# Patient Record
Sex: Female | Born: 1955 | Race: White | Hispanic: No | Marital: Married | Smoking: Never smoker
Health system: Southern US, Community
[De-identification: ages and names within clinical notes are randomized; demographics above are authoritative.]

## PROBLEM LIST (undated history)

## (undated) DIAGNOSIS — C541 Malignant neoplasm of endometrium: Secondary | ICD-10-CM

## (undated) DIAGNOSIS — I509 Heart failure, unspecified: Secondary | ICD-10-CM

## (undated) DIAGNOSIS — I10 Essential (primary) hypertension: Secondary | ICD-10-CM

## (undated) DIAGNOSIS — I890 Lymphedema, not elsewhere classified: Secondary | ICD-10-CM

## (undated) DIAGNOSIS — M170 Bilateral primary osteoarthritis of knee: Secondary | ICD-10-CM

## (undated) HISTORY — DX: Morbid (severe) obesity due to excess calories: E66.01

## (undated) HISTORY — DX: Essential (primary) hypertension: I10

## (undated) HISTORY — DX: Lymphedema, not elsewhere classified: I89.0

## (undated) HISTORY — DX: Malignant neoplasm of endometrium: C54.1

## (undated) HISTORY — DX: Bilateral primary osteoarthritis of knee: M17.0

## (undated) NOTE — *Deleted (*Deleted)
Physician Discharge Summary  Carmen [name] [id number] DOB: 1956/09/06 DOA: 09/26/2020  PCP: Cletis Athens, MD  Admit date: 09/26/2020 Discharge date: 10/07/2020  Admitted From: home Disposition:  SNF  Recommendations for Outpatient Follow-up:  1. Follow-up with PCP in 1 week 2. Repeat BMP and magnesium in 1 week 3. Follow-up with cardiology in 1 week  4. Follow-up with lymphedema clinic 5. Low-sodium diet   Home Health:none  Equipment/Devices:none Discharge Condition:stable CODE STATUS: full code  Diet recommendation: low sodium diet  Brief/Interim Summary: 85 year old female with history of chronic diastolic heart failure, morbid obesity, chronic respiratory failure on home O2 2 L history of endometrial cancer presenting with several day history of dyspnea on exertion. She's currently requiring 4 L by [place] and is being treated for a heart failure exacerbation.   Acute on chronic respiratory failure with hypoxia: -Secondary to underlying acute on chronic diastolic CHF. -Continued to require more than her baseline, needing 2-4 L (on 2 L at baseline) which is likely her new baseline. -CTA chest negative for acute PE.   -No evidence of infiltrate on chest x-ray or CTA  Acute on chronic diastolic CHF (congestive heart failure) ([place]) -On admission chest x-ray:cardiomegaly and pulmonary vascular congestion -Initially was started on Lasix 40 IV twice daily then was switched to Lasix 60 twice daily and then switched 40 p.o. twice daily  on 10/06/2020. She is net negative 19L  -continuedlisinopril, Coreg - CXR 11/6 with mild pulm edema, will continue diuresis - repeat CXR 11/7 - mild CHF, improved - repeat CXR 11/9 - mild vascular congestion without overt pulm edema -Echocardiogramwas overall a difficult study due to body habitus but shows normal LV systolic function. -Cardiologyinput appreciated -> recommended 40 mg PO lasix, increased lisinopril to 20 mg daily in addition  to coreg Patient will be discharged on Lasix 40 p.o. twice daily and lisinopril 20 mg daily as per cardiology recommendations.  Elevated troponin -Due to demand ischemia. No chest pain/MI - cardiology signed off.  Chronic lymphedema - wound care consulted-> appreciated woudn care recs -She may benefit fromlymphedema clinic-discussed about to follow-up with lymphedema clinic outpatient.  Morbid obesity with BMI of 60.0-69.9, adult ([place]) -Complicating factor to overall prognosis and care -Encourage diet modification/walking and weight loss.  Discharge Diagnoses:  Acute on chronic respiratory failure with hypoxia Acute on chronic diastolic CHF Elevated troponin Chronic lymphedema Morbid obesity with BMI of 60   Discharge Instructions  Discharge Instructions    Diet - low sodium heart healthy   Complete by: As directed    Discharge instructions   Complete by: As directed    Follow-up with PCP in 1 week Repeat BMP and magnesium in 1 week Follow-up with cardiology in 1 week  Follow-up with lymphedema clinic   Discharge wound care:   Complete by: As directed    As per wound care recommendations   Increase activity slowly   Complete by: As directed      Allergies as of 10/07/2020      Reactions   Clarithromycin    REACTION: respiratory issues, can't breathe   Penicillins    REACTION: can't breath      Medication List    STOP taking these medications   amLODipine 10 MG tablet Commonly known as: NORVASC   cloNIDine 0.1 MG tablet Commonly known as: CATAPRES     TAKE these medications   carvedilol 6.25 MG tablet Commonly known as: COREG Take 1 tablet (6.25 mg total) by mouth 2 (two) times  daily with a meal. What changed:   medication strength  how much to take  when to take this   enalapril 20 MG tablet Commonly known as: VASOTEC Take 20 mg by mouth daily.   furosemide 40 MG tablet Commonly known as: LASIX Take 1 tablet (40 mg total) by mouth  2 (two) times daily. What changed:   how much to take  when to take this   MEGA MULTI WOMEN PO Take 1,200 mg by mouth daily.   potassium chloride 10 MEQ tablet Commonly known as: KLOR-CON TAKE 1 TABLET AT BEDTIME   vitamin E 180 MG (400 UNITS) capsule Take 400 Units by mouth 2 (two) times daily.            Durable Medical Equipment  (From admission, onward)         Start     Ordered   10/01/20 1048  For home use only DME standard manual wheelchair with seat cushion  Once       Comments: Patient suffers from heart failure which impairs their ability to perform daily activities like bathing, dressing, and toileting in the home.  A walker will not resolve issue with performing activities of daily living. A wheelchair will allow patient to safely perform daily activities. Patient can safely propel the wheelchair in the home or has a caregiver who can provide assistance. Length of need 12 months . Accessories: elevating leg rests (ELRs), wheel locks, extensions and anti-tippers.   10/01/20 1048   09/30/20 1234  For home use only DME Bedside commode  Once       Comments: 3 in 1  Question:  Patient needs a bedside commode to treat with the following condition  Answer:  Weakness   09/30/20 1233   09/30/20 1233  For home use only DME Walker rolling  Once       Question Answer Comment  Walker: With [place]   Patient needs a walker to treat with the following condition Weakness      09/30/20 1232           Discharge Care Instructions  (From admission, onward)         Start     Ordered   10/07/20 0000  Discharge wound care:       Comments: As per wound care recommendations   10/07/20 [place] Follow up on 10/13/2020.   Specialty: Cardiology Why: at 2:30pm. Enter through the [place] entrance Contact information: [address]  [place]             Allergies  Allergen Reactions  . Clarithromycin     REACTION: respiratory issues, can't breathe  . Penicillins     REACTION: can't breath    Consultations:     Procedures/Studies: CT Angio Chest PE W and/or Wo Contrast  Result Date: 09/27/2020 CLINICAL DATA:  Elevated D-dimer. EXAM: CT ANGIOGRAPHY CHEST WITH CONTRAST TECHNIQUE: Multidetector CT imaging of the chest was performed using the standard protocol during bolus administration of intravenous contrast. Multiplanar CT image reconstructions and MIPs were obtained to evaluate the vascular anatomy. CONTRAST:  128mL OMNIPAQUE IOHEXOL 350 MG/ML SOLN COMPARISON:  None. FINDINGS: Cardiovascular: There is moderate severity calcification of the aortic arch. Satisfactory opacification of the pulmonary arteries to the segmental level. No evidence of pulmonary embolism. Normal heart size.  No pericardial effusion. Mediastinum/Nodes: There is mild right hilar and AP window lymphadenopathy. Thyroid gland, trachea, and esophagus demonstrate no significant findings. Lungs/Pleura: A 6 mm noncalcified lung nodule versus focal scar seen within the posterior aspect of the left apex. An 8 mm noncalcified lung nodule is seen within the anterolateral aspect of the right upper lobe. A 6 mm posterior right upper lobe noncalcified lung nodule is seen. Adjacent 6 mm noncalcified lung nodules are seen along the posterolateral aspect of the right lower lobe. There is no evidence of acute infiltrate, pleural effusion or pneumothorax. Upper Abdomen: No acute abnormality. Musculoskeletal: No chest wall abnormality. No acute or significant osseous findings. Review of the MIP images confirms the above findings. IMPRESSION: 1. No evidence of pulmonary embolism. 2. Multiple bilateral noncalcified lung nodules. Correlation with follow-up chest CT is recommended in 3-6 months. 3. Aortic atherosclerosis. Aortic Atherosclerosis (ICD10-I70.0).  Electronically Signed   By: Virgina Norfolk M.D.   On: 09/27/2020 01:28   DG Chest Port 1 View  Result Date: 10/05/2020 CLINICAL DATA:  Shortness of breath. EXAM: PORTABLE CHEST 1 VIEW COMPARISON:  10/03/2020 FINDINGS: The heart is mildly enlarged but stable. Mild vascular congestion without overt pulmonary edema. No definite infiltrates or effusions. IMPRESSION: Cardiac enlargement and mild vascular congestion without overt pulmonary edema. Electronically Signed   By: Marijo Sanes M.D.   On: 10/05/2020 07:53   DG Chest Port 1 View  Result Date: 10/03/2020 CLINICAL DATA:  Dyspnea, CHF EXAM: PORTABLE CHEST 1 VIEW COMPARISON:  Chest radiograph from one day prior. FINDINGS: Stable cardiomediastinal silhouette with mild cardiomegaly. No pneumothorax. No pleural effusion. Borderline mild pulmonary edema, improved. No consolidative airspace disease. IMPRESSION: Borderline mild congestive heart failure, improved. Electronically Signed   By: Ilona Sorrel M.D.   On: 10/03/2020 15:02   DG Chest Port 1 View  Result Date: 10/02/2020 CLINICAL DATA:  Shortness of breath, acute on chronic diastolic heart failure, morbid obesity, chronic respiratory failure, history of endometrial cancer EXAM: PORTABLE CHEST 1 VIEW COMPARISON:  Portable exam 0651 hours compared to 09/26/2020 FINDINGS: Enlargement of cardiac silhouette with pulmonary vascular congestion. Scattered interstitial infiltrate consistent with mild pulmonary edema and CHF. Atherosclerotic calcification and tortuosity of thoracic aorta. No pleural effusion or pneumothorax. Osseous structures unremarkable. IMPRESSION: Enlargement of cardiac silhouette with pulmonary vascular congestion and mild pulmonary edema. Electronically Signed   By: Lavonia Dana M.D.   On: 10/02/2020 12:32   DG Chest Portable 1 View  Result Date: 09/26/2020 CLINICAL DATA:  Shortness of breath. EXAM: PORTABLE CHEST 1 VIEW COMPARISON:  March 12, 2014 FINDINGS: There is no evidence of  acute infiltrate, pleural effusion or pneumothorax. Mild to moderate severity prominence of the perihilar pulmonary vasculature is seen. The cardiac silhouette is mildly enlarged. The visualized skeletal structures are unremarkable. IMPRESSION: Cardiomegaly with mild to moderate severity pulmonary vascular congestion. Electronically Signed   By: Virgina Norfolk M.D.   On: 09/26/2020 22:10   ECHOCARDIOGRAM COMPLETE  Result Date: 09/27/2020    ECHOCARDIOGRAM REPORT   Patient Name:   CHASELYNN DRAFT Date of Exam: 09/27/2020 Medical Rec #:  [id number]       Height:       62.0 in Accession #:    [id number]      Weight:       345.5 lb Date of Birth:  09-03-1956       BSA:          2.412 m Patient Age:    40 years  BP:           118/60 mmHg Patient Gender: F               HR:           64 bpm. Exam Location:  [facility] Procedure: 2D Echo, Cardiac Doppler and Color Doppler Indications:     CHF- acute diastolic [id number]  History:         Patient has no prior history of Echocardiogram examinations.                  CHF.  Sonographer:     Sherrie Sport RDCS (AE) Referring Phys:  [id number] Athena Masse Diagnosing Phys: Kathlyn Sacramento MD  Sonographer Comments: No apical window, no subcostal window and Technically challenging study due to limited acoustic windows. IMPRESSIONS  1. Left ventricular ejection fraction, by estimation, is 55 to 60%. The left ventricle has normal function. Left ventricular endocardial border not optimally defined to evaluate regional wall motion. Left ventricular diastolic function could not be evaluated.  2. Right ventricular systolic function is normal. The right ventricular size is normal.  3. Left atrial size was moderately dilated.  4. The mitral valve is normal in structure. No evidence of mitral valve regurgitation. No evidence of mitral stenosis.  5. The aortic valve is normal in structure. Aortic valve regurgitation is not visualized. Mild to moderate aortic valve sclerosis/calcification is  present, without any evidence of aortic stenosis.  6. Very challenging image quality with limited views. FINDINGS  Left Ventricle: Left ventricular ejection fraction, by estimation, is 55 to 60%. The left ventricle has normal function. Left ventricular endocardial border not optimally defined to evaluate regional wall motion. The left ventricular internal cavity size was normal in size. There is no left ventricular hypertrophy. Left ventricular diastolic function could not be evaluated. Right Ventricle: The right ventricular size is normal. No increase in right ventricular wall thickness. Right ventricular systolic function is normal. Left Atrium: Left atrial size was moderately dilated. Right Atrium: Right atrial size was normal in size. Pericardium: There is no evidence of pericardial effusion. Mitral Valve: The mitral valve is normal in structure. No evidence of mitral valve regurgitation. No evidence of mitral valve stenosis. Tricuspid Valve: The tricuspid valve is normal in structure. Tricuspid valve regurgitation is trivial. No evidence of tricuspid stenosis. Aortic Valve: The aortic valve is normal in structure. Aortic valve regurgitation is not visualized. Mild to moderate aortic valve sclerosis/calcification is present, without any evidence of aortic stenosis. Pulmonic Valve: The pulmonic valve was normal in structure. Pulmonic valve regurgitation is not visualized. No evidence of pulmonic stenosis. Aorta: The aortic root is normal in size and structure. Venous: The inferior vena cava was not well visualized. IAS/Shunts: No atrial level shunt detected by color flow Doppler.  LEFT VENTRICLE PLAX 2D LVIDd:         4.22 cm LVIDs:         2.75 cm LV PW:         1.14 cm LV IVS:        0.99 cm LVOT diam:     2.00 cm LVOT Area:     3.14 cm  LEFT ATRIUM         Index LA diam:    4.50 cm 1.87 cm/m                        PULMONIC VALVE AORTA  PV Vmax:        0.92 m/s Ao Root diam: 2.40 cm PV Peak  grad:   3.4 mmHg                       RVOT Peak grad: 16 mmHg   SHUNTS Systemic Diam: 2.00 cm Kathlyn Sacramento MD Electronically signed by Kathlyn Sacramento MD Signature Date/Time: 09/27/2020/12:38:09 PM    Final       Subjective: Patient seen and examined.  Sitting comfortably on the bed.  Tells me that she feels better, denies shortness of breath, chest pain, orthopnea, PND.  Discussed about outpatient lymphedema clinic follow-up and she verbalized understanding.  Discharge Exam: Vitals:   10/07/20 0458 10/07/20 0743  BP: (!) 124/56 114/72  Pulse: (!) 55 (!) 57  Resp: 20 16  Temp: 97.9 F (36.6 C) 97.9 F (36.6 C)  SpO2: 94% 99%   Vitals:   10/06/20 1945 10/07/20 0017 10/07/20 0458 10/07/20 0743  BP: 110/70 (!) 114/52 (!) 124/56 114/72  Pulse: (!) 57 82 (!) 55 (!) 57  Resp: 20 20 20 16   Temp: 97.9 F (36.6 C) 98.2 F (36.8 C) 97.9 F (36.6 C) 97.9 F (36.6 C)  TempSrc: Oral Oral    SpO2: 97% 92% 94% 99%  Weight:      Height:        General: Pt is alert, awake, not in acute distress, on 4 L of oxygen via nasal cannula.  Morbidly obese. Cardiovascular: RRR, S1/S2 +, no rubs, no gallops Respiratory: CTA bilaterally, no wheezing, no rhonchi Abdominal: Soft, NT, ND, bowel sounds + Extremities: Bilateral chronic lymphedema noted.   The results of significant diagnostics from this hospitalization (including imaging, microbiology, ancillary and laboratory) are listed below for reference.     Microbiology: No results found for this or any previous visit (from the past 240 hour(s)).   Labs: BNP (last 3 results) Recent Labs    09/26/20 2213 10/03/20 0444  BNP 123.2* [id number]   Basic Metabolic Panel: Recent Labs  Lab 10/02/20 0520 10/03/20 0444 10/04/20 0447 10/05/20 0534 10/06/20 0523  NA 142 143 141 142 142  K 3.7 3.6 3.6 3.6 3.6  CL 100 99 98 97* 99  CO2 34* 36* 34* 33* 33*  GLUCOSE 115* 128* 124* 119* 121*  BUN 20 23 25* 34* 36*  CREATININE 0.62 0.64 0.66 0.91  0.68  CALCIUM 8.9 8.7* 8.7* 8.6* 8.9  MG 2.3 2.3 2.2 2.3 2.2  PHOS 4.6 4.6 5.0* 5.4* 4.3   Liver Function Tests: Recent Labs  Lab 10/02/20 0520 10/03/20 0444 10/04/20 0447 10/05/20 0534 10/06/20 0523  AST 20 23 23 24 21   ALT 19 22 25 24 23   ALKPHOS 70 74 68 69 66  BILITOT 0.7 0.8 0.7 0.8 0.7  PROT 7.3 7.5 7.2 7.7 7.6  ALBUMIN 3.3* 3.3* 3.1* 3.5 3.3*   No results for input(s): LIPASE, AMYLASE in the last 168 hours. No results for input(s): AMMONIA in the last 168 hours. CBC: Recent Labs  Lab 10/02/20 0520 10/03/20 0444 10/04/20 0447 10/05/20 0534 10/06/20 0523  WBC 6.7 5.8 6.1 6.9 6.2  NEUTROABS 4.8 4.0 4.2 5.1 4.6  HGB 12.6 12.7 13.0 13.3 13.1  HCT 39.8 39.8 40.8 41.8 41.2  MCV 95.0 97.1 96.2 95.7 96.5  PLT 173 162 162 181 187   Cardiac Enzymes: No results for input(s): CKTOTAL, CKMB, CKMBINDEX, TROPONINI in the last 168 hours. BNP: Invalid input(s): POCBNP CBG: No results  for input(s): GLUCAP in the last 168 hours. D-Dimer No results for input(s): DDIMER in the last 72 hours. Hgb A1c No results for input(s): HGBA1C in the last 72 hours. Lipid Profile No results for input(s): CHOL, HDL, LDLCALC, TRIG, CHOLHDL, LDLDIRECT in the last 72 hours. Thyroid function studies No results for input(s): TSH, T4TOTAL, T3FREE, THYROIDAB in the last 72 hours.  Invalid input(s): FREET3 Anemia work up No results for input(s): VITAMINB12, FOLATE, FERRITIN, TIBC, IRON, RETICCTPCT in the last 72 hours. Urinalysis    Component Value Date/Time   COLORURINE YELLOW 01/19/2011 1500   APPEARANCEUR CLEAR 01/19/2011 1500   LABSPEC 1.006 01/19/2011 1500   PHURINE 7.0 01/19/2011 1500   GLUCOSEU NEGATIVE 10/15/2010 2032   HGBUR LARGE (A) 01/19/2011 1500   BILIRUBINUR NEGATIVE 01/19/2011 1500   KETONESUR NEGATIVE 01/19/2011 1500   PROTEINUR NEGATIVE 01/19/2011 1500   UROBILINOGEN 0.2 01/19/2011 1500   NITRITE NEGATIVE 01/19/2011 1500   LEUKOCYTESUR NEGATIVE 01/19/2011 1500    Sepsis Labs Invalid input(s): PROCALCITONIN,  WBC,  LACTICIDVEN Microbiology No results found for this or any previous visit (from the past 240 hour(s)).   Time coordinating discharge: Over 30 minutes  SIGNED:   Mckinley Jewel, MD  [facility] 10/07/2020, 10:04 AM Pager   If 7PM-7AM, please contact night-coverage [email or web address]

---

## 2007-06-21 ENCOUNTER — Encounter: Payer: Self-pay | Admitting: Family Medicine

## 2007-06-22 ENCOUNTER — Encounter: Payer: Self-pay | Admitting: Family Medicine

## 2007-06-22 LAB — CONVERTED CEMR LAB
ALT: 21 units/L
Cholesterol: 195 mg/dL
Hemoglobin: 13.1 g/dL
LDL Cholesterol: 127 mg/dL
Total Protein: 7.3 g/dL
Triglycerides: 134 mg/dL
WBC: 7.4 10*3/uL

## 2007-06-26 ENCOUNTER — Encounter: Payer: Self-pay | Admitting: Family Medicine

## 2007-06-26 LAB — CONVERTED CEMR LAB: Pap Smear: NORMAL

## 2010-09-02 ENCOUNTER — Encounter: Payer: Self-pay | Admitting: Family Medicine

## 2010-09-02 ENCOUNTER — Ambulatory Visit: Payer: Self-pay | Admitting: Internal Medicine

## 2010-09-02 DIAGNOSIS — M25569 Pain in unspecified knee: Secondary | ICD-10-CM

## 2010-09-02 DIAGNOSIS — E669 Obesity, unspecified: Secondary | ICD-10-CM

## 2010-09-02 LAB — CONVERTED CEMR LAB
ALT: 15 units/L (ref 0–35)
Albumin: 3.5 g/dL (ref 3.5–5.2)
Bilirubin, Direct: 0.1 mg/dL (ref 0.0–0.3)
CO2: 26 meq/L (ref 19–32)
Chloride: 107 meq/L (ref 96–112)
Creatinine, Ser: 0.7 mg/dL (ref 0.4–1.2)
Glucose, Bld: 94 mg/dL (ref 70–99)
TSH: 2.28 microintl units/mL (ref 0.35–5.50)
Total CHOL/HDL Ratio: 5
Total Protein: 7.1 g/dL (ref 6.0–8.3)
Triglycerides: 91 mg/dL (ref 0.0–149.0)

## 2010-09-15 ENCOUNTER — Ambulatory Visit: Payer: Self-pay | Admitting: Family Medicine

## 2010-09-15 ENCOUNTER — Encounter (INDEPENDENT_AMBULATORY_CARE_PROVIDER_SITE_OTHER): Payer: Self-pay | Admitting: *Deleted

## 2010-09-24 ENCOUNTER — Encounter: Payer: Self-pay | Admitting: Family Medicine

## 2010-09-30 ENCOUNTER — Ambulatory Visit: Payer: Self-pay | Admitting: Family Medicine

## 2010-09-30 ENCOUNTER — Other Ambulatory Visit: Admission: RE | Admit: 2010-09-30 | Discharge: 2010-09-30 | Payer: Self-pay | Admitting: Family Medicine

## 2010-09-30 DIAGNOSIS — R0602 Shortness of breath: Secondary | ICD-10-CM

## 2010-10-10 ENCOUNTER — Telehealth: Payer: Self-pay | Admitting: Family Medicine

## 2010-10-10 DIAGNOSIS — R87619 Unspecified abnormal cytological findings in specimens from cervix uteri: Secondary | ICD-10-CM

## 2010-10-15 ENCOUNTER — Emergency Department (HOSPITAL_COMMUNITY): Admission: EM | Admit: 2010-10-15 | Discharge: 2010-10-15 | Payer: Self-pay | Admitting: Emergency Medicine

## 2010-12-09 ENCOUNTER — Ambulatory Visit
Admission: RE | Admit: 2010-12-09 | Discharge: 2010-12-09 | Payer: Self-pay | Source: Home / Self Care | Attending: Gynecology | Admitting: Gynecology

## 2010-12-28 NOTE — Letter (Signed)
Summary: Results Follow up Letter  [place] at [facility]  [address] [place], [place] [address]   Phone: [phone]  Fax: [phone]    09/15/2010 MRN: [id number]  [facility] [address] [place], [place]  [address]  Dear Ms. Ronnald Ramp,  The following are the results of your recent test(s):  Test         Result    Pap Smear:        Normal _____  Not Normal _____ Comments: ______________________________________________________ Cholesterol: LDL(Bad cholesterol):         Your goal is less than:         HDL (Good cholesterol):       Your goal is more than: Comments:  ______________________________________________________ Mammogram:        Normal _____  Not Normal _____ Comments:  ___________________________________________________________________ Hemoccult:        Normal __X___  Not normal _______ Comments:  Repeat in 1 year  _____________________________________________________________________ Other Tests:    We routinely do not discuss normal results over the telephone.  If you desire a copy of the results, or you have any questions about this information we can discuss them at your next office visit.   Sincerely,     Clovis Cao

## 2010-12-28 NOTE — Assessment & Plan Note (Signed)
Summary: NEW PT TO BE ESTABLISHED/JRR   Vital Signs:  Patient profile:   55 year old female Height:      60.25 inches Weight:      295.25 pounds BMI:     57.39 Temp:     97.9 degrees F oral Pulse rate:   78 / minute Pulse rhythm:   regular BP sitting:   142 / 80  (right arm) Cuff size:   large  Vitals Entered By: Maudie Mercury Dance CMA Deborra Medina) (September 02, 2010 9:51 AM) CC: New patient to establish care   History of Present Illness: CC: new patient , get established  1. R knee pain > L knee pain - for several years, worse pain for last 3wks.  wonders if has had arthritis.  Using cream flexol on knees, helps.  2. L elbow pain - fell 2008 on L elbow, center of back of elbow pain.  goes down arm.  no tingling or numbness.    Using cane for knee.  Previously saw [facility], [place], Dr. Rosanna Randy.  Preventative: never had mammograms, declines.  never had colonscopy.  would like iFOB today.  last pap 2008, due for this.  tetanus 2008, doesn't get flu shots.  Last blood work was 2008.  Current Medications (verified): 1)  Allegra Allergy 180 Mg Tabs (Fexofenadine Hcl) .Marland Kitchen.. 1 By Mouth Once Daily 2)  Caltrate 600+d 600-400 Mg-Unit Tabs (Calcium Carbonate-Vitamin D) .... 2 By Mouth Once Daily 3)  Magnesium 250 Mg Tabs (Magnesium) .Marland Kitchen.. 1 By Mouth Once Daily 4)  Vitamin E 400 Unit Caps (Vitamin E) .... 2 By Mouth Once Daily 5)  Fish Oil 1200 Mg Caps (Omega-3 Fatty Acids) .Marland Kitchen.. 1 By Mouth Once Daily  Allergies (verified): 1)  ! Pcn 2)  ! 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  The patient denies anorexia, fever, weight loss, weight gain, vision loss, decreased hearing, hoarseness, chest pain, syncope, dyspnea on exertion, prolonged cough, headaches, hemoptysis, abdominal pain, melena, hematochezia, severe indigestion/heartburn, hematuria, suspicious skin lesions, depression, and breast masses.    Physical Exam  General:  Well-developed,well-nourished,in no acute distress; alert,appropriate and cooperative throughout examination.  morbidly obese Head:  Normocephalic and atraumatic without obvious abnormalities. No apparent alopecia or balding. Eyes:  No corneal or conjunctival inflammation noted. EOMI. Perrla.  Ears:  External ear exam shows no significant lesions or deformities.  Otoscopic examination reveals clear canals, tympanic membranes are intact bilaterally without bulging, retraction, inflammation or discharge. Hearing is grossly normal bilaterally. Nose:  External nasal examination shows no deformity or inflammation. Nasal mucosa are pink and moist without lesions or exudates. Mouth:  Oral mucosa and oropharynx without lesions or exudates.  Teeth in good repair. Neck:  No deformities, masses, or tenderness noted. Lungs:  Normal respiratory effort, chest expands symmetrically. Lungs are clear to auscultation, no crackles or wheezes. Heart:  Normal rate and regular rhythm. S1 and S2 normal without gallop, murmur, click, rub or other extra sounds. Abdomen:  Bowel sounds positive,abdomen soft and non-tender without masses, organomegaly  or hernias noted. Msk:  marked tenderness to palpation bilateral knees diffusely.  no erythema, warmth, evident effusion.  difficult to palpate landmarks 2/2 habitus Pulses:  2+ rad pulses Extremities:  edema, nonpitting Neurologic:  CN grossly intact, antalgic gait 2/2 knee pain, cane. Skin:  Intact without suspicious lesions or rashes Psych:  full affect   Impression & Recommendations:  Problem #  1:  OBESITY (ICD-278.00)  basic blood work today.  discussed weight and diet changes, discussed how wieight directly impacts knee pain.  Ht: 60.25 (09/02/2010)   Wt: 295.25 (09/02/2010)   BMI: 57.39 (09/02/2010)  Orders: TLB-BMP (Basic Metabolic Panel-BMET) (99991111) TLB-Hepatic/Liver Function Pnl (80076-HEPATIC) TLB-Lipid Panel (80061-LIPID) TLB-TSH (Thyroid Stimulating Hormone) (84443-TSH)  Problem # 2:  KNEE PAIN, BILATERAL (ICD-719.46) likely arthritis, treat with ice, tylenol.  check Cr prior to rec NSAIDs, consider starting glucosamine/chondroitin.  Problem # 3:  SPECIAL SCREENING FOR MALIGNANT NEOPLASMS COLON (ICD-V76.51) sent home with iFOB.  Problem # 4:  Preventive Health Care (ICD-V70.0) scheduled CPE for next 1-2 months.  Rec pap, breast exam.  pt declines flu and mammogram.  UTD tetanus. iFOB sent today.  obtain records from previous provider, will review when arrive.  Complete Medication List: 1)  Allegra Allergy 180 Mg Tabs (Fexofenadine hcl) .Marland Kitchen.. 1 by mouth once daily 2)  Caltrate 600+d 600-400 Mg-unit Tabs (Calcium carbonate-vitamin d) .... 2 by mouth once daily 3)  Magnesium 250 Mg Tabs (Magnesium) .Marland Kitchen.. 1 by mouth once daily 4)  Vitamin E 400 Unit Caps (Vitamin e) .... 2 by mouth once daily 5)  Fish Oil 1200 Mg Caps (Omega-3 fatty acids) .Marland Kitchen.. 1 by mouth once daily  Patient Instructions: 1)  Return as needed, or in 1-2 month for complete physical. 2)  Release of information for Dr. Glennon Hamilton in [facility] and [facility]. 3)  For knees - ice to knees, no more than 15 min at a time, 3 times a daily.  Start extra strength tylenol 500mg  three to four times a day for joint pain. 4)  Blood work today, check cholesterol, sugar, thyroid and blood. 5)  Sent home with stool kit, mail it back in. 6)  Good to meet you today, call clinic with questions.  Current Allergies (reviewed today): ! PCN ! BIAXIN

## 2010-12-28 NOTE — Progress Notes (Signed)
Summary: Cytology call report  Phone Note Other Incoming   Caller: [facility] Summary of Call: Carmen [name] at Cytology called report on Pap- Satisfactory for eval Typical galndular cells favor endometrial origin Recommend tissue studies Negative HPV read by Dr. Nicoletta Dress Initial call taken by: Arnetha Courser CMA Deborra Medina),  October 10, 2010 8:30 AM  Follow-up for Phone Call        noted. Follow-up by: [name]  MD,  October 10, 2010 8:43 AM

## 2010-12-28 NOTE — Miscellaneous (Signed)
  Clinical Lists Changes  Observations: Added new observation of HEMOCULTDUE: 09/2011 (09/15/2010 15:08) Added new observation of HEMOCCULT: negative (09/15/2010 15:08)      Preventive Care Screening  Hemoccult:    Date:  09/15/2010    Next Due:  09/2011    Results:  negative

## 2010-12-28 NOTE — Letter (Signed)
Summary: [facility]: Immunoassay Fecal Occult Blood (iFOB) Order Form  [place] [facility]  [address] [place], [place] [address]   Phone: [phone]  Fax: [phone]      [facility]: Immunoassay Fecal Occult Blood (iFOB) Order Form   September 02, 2010 MRN: [id number]   Carmen [name] [birth year]   Physicican Name:_________________________  Diagnosis Code:_________V76.49_________________      [name]  MD

## 2010-12-28 NOTE — Assessment & Plan Note (Signed)
Summary: CPX/RBH   Vital Signs:  Patient profile:   55 year old female Weight:      297.75 pounds Temp:     97.4 degrees F oral Pulse rate:   88 / minute Pulse rhythm:   regular BP sitting:   140 / 90  (left arm) Cuff size:   large  Vitals Entered By: Maudie Mercury Dance CMA Deborra Medina) (September 30, 2010 11:34 AM) CC: CPx   History of Present Illness: CC: CPE  here for preventative.  no concerns today.  due for pap and breast.  does SBE on the first of each month.  no h/o abnormal paps.    obesity - has pain in knees limiting exercise.  has 2 and 5lb weights she could raise.  Tries to limit bread.  does like broccoli.  Knee - even walking bothers knee.  ice does help. has fallen x 2 3 years ago.  No smoking.  + SOB since fall.  no cough.  no orthopnea, no PND.  + snores.  Wakes up rested.    Tdap 05/2007, no BMD done.    -  Date:  06/11/2007    TD booster Tdap  Current Medications (verified): 1)  Allegra Allergy 180 Mg Tabs (Fexofenadine Hcl) .Marland Kitchen.. 1 By Mouth Once Daily 2)  Caltrate 600+d 600-400 Mg-Unit Tabs (Calcium Carbonate-Vitamin D) .... 2 By Mouth Once Daily 3)  Magnesium 250 Mg Tabs (Magnesium) .Marland Kitchen.. 1 By Mouth Once Daily 4)  Vitamin E 400 Unit Caps (Vitamin E) .... 2 By Mouth Once Daily 5)  Fish Oil 1200 Mg Caps (Omega-3 Fatty Acids) .Marland Kitchen.. 1 By Mouth Once Daily  Allergies: 1)  ! Pcn 2)  ! 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  morbidly obese Head:  Normocephalic and atraumatic without obvious abnormalities. No apparent alopecia or balding. Eyes:  No corneal or conjunctival inflammation noted. EOMI. Perrla.  Ears:  External ear exam shows no significant lesions or deformities.  Otoscopic examination reveals clear canals, tympanic membranes are intact bilaterally without bulging, retraction, inflammation or discharge. Hearing is grossly normal bilaterally. Nose:  External nasal examination shows no deformity or inflammation. Nasal mucosa are pink and moist without lesions or exudates. Mouth:  Oral mucosa and oropharynx without lesions or exudates.  Teeth in good repair. Neck:  No deformities, masses, or tenderness noted.  no bruits, no LAD Breasts:  No mass, nodules, thickening, tenderness, bulging, retraction, inflamation, nipple discharge or skin changes noted.   Lungs:  Normal respiratory effort, chest expands symmetrically. Lungs are clear to auscultation, no crackles or wheezes. Heart:  3/6 SEM heard best at LUSB. Abdomen:  Bowel sounds positive,abdomen soft and non-tender without masses, organomegaly or hernias noted. Genitalia:  Pelvic Exam:        External: normal female genitalia without lesions or masses        Vagina: normal without lesions  or masses        Cervix: normal without lesions or masses        Adnexa: normal bimanual exam without masses or fullness        Uterus: normal by palpation        Pap smear: performed Pulses:  2+ rad pulses Extremities:  edema, nonpitting Skin:  Intact without suspicious lesions or rashes   Impression & Recommendations:  Problem # 1:  HEALTH MAINTENANCE EXAM (ICD-V70.0) preventative protocols updated.  declines flu shot.  utd tetanus.  discussed healthy eating, maintaining exercise.  check Mg next blood draw.  Problem # 2:  ROUTINE GYNECOLOGICAL EXAMINATION  (ICD-V72.31) done with pap.  Problem # 3:  SCREENING FOR MALIGNANT NEOPLASM OF THE CERVIX (ICD-V76.2)  if normal, Q3 years.  Orders: Pap Smear, Thin Prep ( Collection of) [id number])  Problem # 4:  SPECIAL SCREENING FOR MALIGNANT NEOPLASMS COLON (ICD-V76.51) done, negative iFOB.  Problem # 5:  SHORTNESS OF BREATH (ICD-786.05) with murmur.  likely will obtain echo.. return in 1 mo for f/u of this and more comprehensive discussion.  Complete Medication List: 1)  Allegra Allergy 180 Mg Tabs (Fexofenadine hcl) .Marland Kitchen.. 1 by mouth once daily 2)  Caltrate 600+d 600-400 Mg-unit Tabs (Calcium carbonate-vitamin d) .... 2 by mouth once daily 3)  Magnesium 250 Mg Tabs (Magnesium) .Marland Kitchen.. 1 by mouth once daily 4)  Vitamin E 400 Unit Caps (Vitamin e) .... 2 by mouth once daily 5)  Fish Oil 1200 Mg Caps (Omega-3 fatty acids) .Marland Kitchen.. 1 by mouth once daily  Patient Instructions: 1)  return in 1-2 months to talk about shortness of breath and snoring. 2)  physical today, everything looking healthy and normal. 3)  Please call us with quesitons.  Good to see you today.   Orders Added: 1)  Est. Patient 40-64 years [id number] 2)  Pap Smear, Thin Prep ( Collection of) [id number]    Current Allergies (reviewed today): ! PCN ! BIAXIN   Prevention & Chronic Care Immunizations   Influenza vaccine: Not documented   Influenza vaccine deferral: Refused  (09/30/2010)    Tetanus booster: 06/11/2007: Tdap    Pneumococcal vaccine: Not documented  Colorectal Screening   Hemoccult: negative  (09/15/2010)   Hemoccult due: 09/2011    Colonoscopy: Not documented   Colonoscopy action/deferral: Not indicated  (09/30/2010)  Other Screening   Pap smear: normal  (06/26/2007)    Mammogram: Not documented   Mammogram action/deferral: Refused  (09/30/2010)   Smoking status: Not documented  Lipids   Total Cholesterol: 184  (09/02/2010)   LDL: 129  (09/02/2010)   LDL Direct: Not documented   HDL: 36.90   (09/02/2010)   Triglycerides: 91.0  (09/02/2010)

## 2011-01-06 ENCOUNTER — Ambulatory Visit (HOSPITAL_COMMUNITY)
Admission: RE | Admit: 2011-01-06 | Discharge: 2011-01-06 | Disposition: A | Discharge status: Home / Self Care | Payer: BC Managed Care – PPO | Source: Ambulatory Visit | Attending: Obstetrics & Gynecology | Admitting: Obstetrics & Gynecology

## 2011-01-06 ENCOUNTER — Other Ambulatory Visit: Payer: Self-pay | Admitting: Obstetrics & Gynecology

## 2011-01-06 ENCOUNTER — Encounter (HOSPITAL_COMMUNITY): Payer: BC Managed Care – PPO

## 2011-01-06 ENCOUNTER — Other Ambulatory Visit: Payer: Self-pay | Admitting: Gynecologic Oncology

## 2011-01-06 DIAGNOSIS — R0989 Other specified symptoms and signs involving the circulatory and respiratory systems: Secondary | ICD-10-CM | POA: Insufficient documentation

## 2011-01-06 DIAGNOSIS — Z01818 Encounter for other preprocedural examination: Secondary | ICD-10-CM

## 2011-01-06 DIAGNOSIS — R059 Cough, unspecified: Secondary | ICD-10-CM | POA: Insufficient documentation

## 2011-01-06 DIAGNOSIS — C549 Malignant neoplasm of corpus uteri, unspecified: Secondary | ICD-10-CM | POA: Insufficient documentation

## 2011-01-06 DIAGNOSIS — R05 Cough: Secondary | ICD-10-CM | POA: Insufficient documentation

## 2011-01-06 LAB — CBC
HCT: 33.3 % — ABNORMAL LOW (ref 36.0–46.0)
MCH: 22.5 pg — ABNORMAL LOW (ref 26.0–34.0)
MCHC: 29.7 g/dL — ABNORMAL LOW (ref 30.0–36.0)
MCV: 75.7 fL — ABNORMAL LOW (ref 78.0–100.0)
RDW: 18.6 % — ABNORMAL HIGH (ref 11.5–15.5)

## 2011-01-06 LAB — COMPREHENSIVE METABOLIC PANEL
Alkaline Phosphatase: 97 U/L (ref 39–117)
BUN: 15 mg/dL (ref 6–23)
Calcium: 9 mg/dL (ref 8.4–10.5)
Glucose, Bld: 96 mg/dL (ref 70–99)
Total Protein: 7.5 g/dL (ref 6.0–8.3)

## 2011-01-06 LAB — DIFFERENTIAL
Eosinophils Relative: 1 % (ref 0–5)
Lymphocytes Relative: 24 % (ref 12–46)
Lymphs Abs: 1.9 10*3/uL (ref 0.7–4.0)
Monocytes Absolute: 0.5 10*3/uL (ref 0.1–1.0)

## 2011-01-06 LAB — SURGICAL PCR SCREEN
MRSA, PCR: NEGATIVE
Staphylococcus aureus: POSITIVE — AB

## 2011-01-10 ENCOUNTER — Ambulatory Visit (HOSPITAL_COMMUNITY)
Admission: RE | Admit: 2011-01-10 | Discharge: 2011-01-11 | DRG: 353 | Disposition: A | Discharge status: Home / Self Care | Payer: BC Managed Care – PPO | Attending: Gynecologic Oncology | Admitting: Gynecologic Oncology

## 2011-01-10 ENCOUNTER — Other Ambulatory Visit: Payer: Self-pay | Admitting: Gynecologic Oncology

## 2011-01-10 DIAGNOSIS — M1710 Unilateral primary osteoarthritis, unspecified knee: Secondary | ICD-10-CM | POA: Insufficient documentation

## 2011-01-10 DIAGNOSIS — C549 Malignant neoplasm of corpus uteri, unspecified: Secondary | ICD-10-CM | POA: Insufficient documentation

## 2011-01-10 DIAGNOSIS — D649 Anemia, unspecified: Secondary | ICD-10-CM | POA: Insufficient documentation

## 2011-01-10 DIAGNOSIS — N809 Endometriosis, unspecified: Secondary | ICD-10-CM | POA: Insufficient documentation

## 2011-01-10 DIAGNOSIS — N838 Other noninflammatory disorders of ovary, fallopian tube and broad ligament: Secondary | ICD-10-CM | POA: Insufficient documentation

## 2011-01-10 DIAGNOSIS — Z01812 Encounter for preprocedural laboratory examination: Secondary | ICD-10-CM | POA: Insufficient documentation

## 2011-01-10 HISTORY — PX: ROBOTIC ASSISTED LAP VAGINAL HYSTERECTOMY: SHX2362

## 2011-01-10 LAB — TYPE AND SCREEN

## 2011-01-11 LAB — CBC
Hemoglobin: 8.5 g/dL — ABNORMAL LOW (ref 12.0–15.0)
MCH: 22.5 pg — ABNORMAL LOW (ref 26.0–34.0)
MCHC: 29.6 g/dL — ABNORMAL LOW (ref 30.0–36.0)

## 2011-01-12 NOTE — Op Note (Signed)
Carmen [name], Carmen [name]               ACCOUNT NO.:  [id number]  MEDICAL RECORD NO.:  [id number]           PATIENT TYPE:  I  LOCATION:  [id number]                         FACILITY:  [facility]  PHYSICIAN:  Ladasha Schnackenberg A. Alycia Rossetti, MD    DATE OF BIRTH:  [birth year]  DATE OF PROCEDURE:  01/10/2011 DATE OF DISCHARGE:  01/06/2011                              OPERATIVE REPORT   PREOPERATIVE DIAGNOSIS:  Grade 1 endometrioid adenocarcinoma.  POSTOPERATIVE DIAGNOSIS:  Grade 1 endometrioid adenocarcinoma and endometriosis.  PROCEDURE:  Total robotic hysterectomy, bilateral salpingo-oophorectomy, lysis of adhesions.  SURGEONS: 1. Aniel Hubble A. Alycia Rossetti, MD 2. Lenard Galloway, M.D.  ASSISTANT:  Caswell Corwin, R.N.  ANESTHESIOLOGIST:  Karsten Ro, M.D.  ANESTHESIA:  General.  ESTIMATED BLOOD LOSS:  100 mL.  IV FLUIDS:  1100 mL.  URINE OUTPUT:  400 mL.  COMPLICATIONS:  None.  SPECIMENS:  Included cervix, uterus, bilateral tubes and ovaries to Pathology.  OPERATIVE FINDINGS:  Included a globular uterus with evidence of bilateral tubal interruption.  Significant intra-abdominal adiposity and obesity.  Endometriosis involving adhesive disease of the rectosigmoid colon to the left adnexa.  There is obliteration of the cul-de-sac on the patient's left side secondary to endometriosis.  The patient was seen in the preop holding area and identified as self. Informed consent was on the chart.  The patient was then taken to the Operating Room and placed under general anesthesia.  She was then placed in the lithotomy position with appropriate precautions, SCDs on.  Arms were then tucked at her side with all appropriate precautions and shoulder blocks were applied.  Time-out was performed to confirm the patient the procedure, antibiotic, and allergy status.  Clindamycin was given.  The perineum was prepped in the usual fashion.  Single-tooth tenaculum was used to grasp the cervix after a speculum was  placed.  The cervix as identified without difficulty.  Medium cone ring was placed without difficulty.  The abdomen was prepped in the usual fashion with poor prep x2.  Once the core prep was dry, the patient was then draped. Time-out again was performed from the patient's procedure, antibiotic allergy status.  After confirming OG tube placement was in and dissection, using the 5-mm OptiVu port in the midclavicular line of the left upper quadrant, we entered the abdomen.  The abdomen was insufflated with CO2 gas.  At this point, all points during the case the patient's intra-abdominal pressure did not increase over 15 mmHg.  She was then placed in Trendelenburg position.  All port sites were marked in the usual fashion.  All port sites were injected.  We first drew our attention to the camera port.  Incision was made for the 10/12 camera port approximately 3 cm above the umbilicus.  It was then under direct visualization.  Bilateral 8-mm robotic trocars with the extra long trocars were used bilaterally approximately 10 cm from midline port andthen a second left lower quadrant port was placed above the CIS. Graspers were then used to atraumatically mobilize and reflect the omentum as well as the small bowel.  The robot was then docked.  It was significant  as stated above, significant adiposity, and a small bowel in the field.  The Ethicon fan was then used to retract the cecum and terminal ileum.  The fourth arm was then used to grasp the rectosigmoid colon along the epiploica to reflect the posterior cul-de-sac to be visualized.  Monopolar cautery was used to transect the round ligament on the patient's right side.  The anterior portion of the broad ligament were opened.  The ureter was identified.  A window was made between the IP and the ureter.  The IP was coagulated with bipolar cautery and transected.  The uterine vessels were then skeletonized, coagulated, and transected.  Our  attention was then drawn to the left side.  There is adhesive disease in the rectosigmoid colon posteriorly.  Following the level of the cone ring, we were able to take the adhesions down using sharp dissection and pinpoint cautery.  We then took the rectosigmoid colon adhesions down from the fallopian tube.  There was physiologic adhesions of the rectosigmoid colon along their left pelvic sidewall.  These were all taken down.  The fan and similarly was used to reflect the rectosigmoid colon.  The cornua of the uterus was grasped with a 4th arm.  We transected the round ligament.  The anterior fascia was were opened.  We could not identify the left ureter but a point high above was made a window between the IP and was felt to most likely be inferiorly of the ureter.  The IP was coagulated and transected.  We skeletonized the uterine vessels and completed the peritoneal flap anteriorly.  The pneumo occluder balloon was then insufflated.  Once we were comfortable that the rectosigmoid colon and cul-de-sac to be developed a point that we were below the cone ring posteriorly, we performed the colpotomy extended in around.  There was some bleeding noted from the area of the right uterine vessels.  This was coagulated. The specimen was then delivered through the vagina.  In the delivery, there was small tear of the vagina.  Figure-of-eight suture on the left side was then performed with CT-1 0 Vicryl for hemostasis.  We then closed the rest of the vaginal cuff using a running suture of #1 Vicryl on a CT-1.  The head and pelvis were copiously irrigated and all pedicles were noted to be hemostatic.  The fan was removed without difficulty.  All trocars removed under direct visualization.  The trocars were removed and the CO2 gas was removed from the patient's abdomen.  The vagina was swabbed and noted to be hemostatic.  At this point, deep sutures of 0 Vicryl and UR-6 were used for the 10-12  port. The skin was closed using 4-0 Vicryl.  Sterile Benzoin were applied.  The patient tolerated the procedure well, was taken to the recovery room in stable condition.  Instrument, needle, and Ray-Tec counts were correct x2.     Lambert Jeanty A. Alycia Rossetti, MD     PAG/MEDQ  D:  01/10/2011  T:  01/10/2011  Job:  [id number]  cc:   Caswell Corwin, R.N. [address] [place], [place] [address]  Lenard Galloway, M.D. FaxWI:[phone]  Electronically Signed by Nancy Marus MD on 01/12/2011 10:19:31 AM

## 2011-01-19 ENCOUNTER — Ambulatory Visit: Payer: BC Managed Care – PPO | Attending: Gynecologic Oncology | Admitting: Gynecologic Oncology

## 2011-01-19 DIAGNOSIS — Z90710 Acquired absence of both cervix and uterus: Secondary | ICD-10-CM | POA: Insufficient documentation

## 2011-01-19 DIAGNOSIS — Z9079 Acquired absence of other genital organ(s): Secondary | ICD-10-CM | POA: Insufficient documentation

## 2011-01-19 DIAGNOSIS — C549 Malignant neoplasm of corpus uteri, unspecified: Secondary | ICD-10-CM | POA: Insufficient documentation

## 2011-01-19 DIAGNOSIS — R350 Frequency of micturition: Secondary | ICD-10-CM | POA: Insufficient documentation

## 2011-01-19 LAB — URINALYSIS, ROUTINE W REFLEX MICROSCOPIC
Bilirubin Urine: NEGATIVE
Ketones, ur: NEGATIVE mg/dL
Leukocytes, UA: NEGATIVE
Protein, ur: NEGATIVE mg/dL
Urine Glucose, Fasting: NEGATIVE mg/dL

## 2011-01-20 LAB — URINE CULTURE
Colony Count: 15000
Culture  Setup Time: 201202232208

## 2011-02-03 NOTE — Discharge Summary (Signed)
Carmen [name], Carmen [name]               ACCOUNT NO.:  [id number]  MEDICAL RECORD NO.:  [id number]           PATIENT TYPE:  I  LOCATION:  [id number]                         FACILITY:  [facility]  PHYSICIAN:  Lenard Galloway, M.D.   DATE OF BIRTH:  [birth year]  DATE OF ADMISSION:  01/10/2011 DATE OF DISCHARGE:  01/11/2011                              DISCHARGE SUMMARY   ADMISSION DIAGNOSES: 1. Grade 1 endometrioid adenocarcinoma. 2. Morbid obesity.  DISCHARGE DIAGNOSES: 1. Grade 1 endometrioid adenocarcinoma. 2. Endometriosis. 3. Morbid obesity. 4. Status post da-Vinci robotic total laparoscopic hysterectomy with     bilateral salpingo-oophorectomy and lysis of adhesions.  SIGNIFICANT OPERATIONS AND PROCEDURES:  The patient underwent a da-Vinci robotic total laparoscopic hysterectomy with bilateral salpingo- oophorectomy and lysis of adhesions on January 10, 2011, at [facility] under the direction of Dr. Nancy Marus.  ADMISSION HISTORY AND PHYSICAL EXAMINATION:  The patient is a 55 year old Caucasian female who initially presented to the [facility] with postmenopausal bleeding.  The patient had an ultrasound which suggested a neoplastic process in the endometrium.  The patient was subsequently referred for gynecologic care.  The patient did not present for a followup appointment and so the patient was contacted directly by Dr. Josefa Half, who requested consultation for the patient for further evaluation and treatment of postmenopausal bleeding.  The patient underwent an endometrial biopsy November 23, 2010, which documented a grade 1 endometrial carcinoma mixed with complex atypical hyperplasia.  The patient was subsequently seen by Dr. Alger MemosDianah Field on December 09, 2010, for further evaluation and treatment of the carcinoma.  The patient's medical history was significant for morbid obesity and osteoarthritis of the knees.  PHYSICAL  EXAMINATION:  VITAL SIGNS:  The patient was noted to be 5 feet 4 inches and weighing 298 pounds.  Her blood pressure was 140/70. ABDOMINAL EXAM:  Documented morbid obesity and no organomegaly nor ascites was appreciated at this time. PELVIC EXAM:  Demonstrated a normal external genitalia, urethra, and vagina with the exception of some blood which was appreciated.  The cervix was unremarkable.  The uterus did not appear to be enlarged and there were no masses which were palpable, but the patient's exam was noted to be limited based on her body habitus.  Rectovaginal examination confirmed the bimanual exam.  There was no evidence of any adenopathy for the patient's examination. In her lower extremities, she had bilateral 2+ edema.  HOSPITAL COURSE:  The patient was admitted on January 10, 2011, to undergo a da-Vinci robotic total laparoscopic hysterectomy with bilateral salpingo-oophorectomy and possible staging under the direction of Dr. Nancy Marus.  The patient had completion of her intended procedure without complications.  Endometriosis was encountered between the rectosigmoid colon and the cervix.  There appeared to be small typical chocolate cyst- type lesions during the dissection.  The patient's surgery was uncomplicated and the estimated blood loss was 100 mL.  Due to the patient's morbid obesity, the lymphadenectomy was not performed.  The uterus, cervix, tubes, and ovaries were sent for permanent sectioning.  Postoperatively, the patient's recovery was remarkable only  for decreased urinary output which occurred after midnight following the patient's surgery.  She received two fluid challenges of lactated Ringer's and eventually responded after having a total of 900 mL which was given over 4 hours.  The patient's vital signs remained stable.  Her oxygen saturation was good.  Her abdominal examination was benign.  She had no excessive vaginal bleeding.  Her postop day #1  hemoglobin was 8.5.  (Her preoperative hemoglobin was 9.9).  The patient was able to ambulate independently, tolerating a regular diet, and she had good pain control with Percocet and ibuprofen.  The patient's final pathology report is pending at the time of her discharge.  INSTRUCTIONS AT DISCHARGE: 1. Discharged to home. 2. The patient will take Vicodin 1-2 p.o. q.4-6 hours p.r.n. pain.     She will also take ibuprofen 600 mg p.o. q.6 hours p.r.n. pain.     She will resume her usual medications and her usual dosages. 3. The patient will have decreased activity, however, she was     encouraged to ambulate frequently at home.  She will have no coital     activity or do any heavy lifting until okayed by her primary     surgeon. 4. The patient will follow a regular diet. 5. The patient will follow up with a GYN Oncology Service in     approximately two week's time. 6. The patient will call if she experiences problems with fever,     nausea, and vomiting, pain uncontrolled by her medication,     incisional drainage, heavy vaginal bleeding, or any other concern.     Lenard Galloway, M.D.     BES/MEDQ  D:  01/11/2011  T:  01/12/2011  Job:  [id number]  cc:   Imagene Gurney A. Alycia Rossetti, MD  Electronically Signed by Josefa Half M.D. on 02/02/2011 09:00:35 AM

## 2011-02-07 LAB — WET PREP, GENITAL
Clue Cells Wet Prep HPF POC: NONE SEEN
Trich, Wet Prep: NONE SEEN

## 2011-02-07 LAB — URINALYSIS, ROUTINE W REFLEX MICROSCOPIC
Nitrite: NEGATIVE
Protein, ur: 30 mg/dL — AB
Specific Gravity, Urine: 1.028 (ref 1.005–1.030)
Urobilinogen, UA: 0.2 mg/dL (ref 0.0–1.0)

## 2011-02-07 LAB — DIFFERENTIAL
Basophils Absolute: 0 10*3/uL (ref 0.0–0.1)
Basophils Relative: 0 % (ref 0–1)
Neutro Abs: 5.7 10*3/uL (ref 1.7–7.7)
Neutrophils Relative %: 74 % (ref 43–77)

## 2011-02-07 LAB — POCT I-STAT, CHEM 8
Chloride: 107 mEq/L (ref 96–112)
HCT: 32 % — ABNORMAL LOW (ref 36.0–46.0)
Hemoglobin: 10.9 g/dL — ABNORMAL LOW (ref 12.0–15.0)
Potassium: 3.9 mEq/L (ref 3.5–5.1)

## 2011-02-07 LAB — URINE MICROSCOPIC-ADD ON

## 2011-02-07 LAB — GC/CHLAMYDIA PROBE AMP, GENITAL
Chlamydia, DNA Probe: NEGATIVE
GC Probe Amp, Genital: NEGATIVE

## 2011-02-07 LAB — CBC
MCHC: 29.6 g/dL — ABNORMAL LOW (ref 30.0–36.0)
Platelets: 269 10*3/uL (ref 150–400)
RDW: 18.2 % — ABNORMAL HIGH (ref 11.5–15.5)

## 2011-02-07 LAB — GC/CHLAMYDIA PROBE AMP, URINE: Chlamydia, Swab/Urine, PCR: NEGATIVE

## 2011-02-07 LAB — POCT PREGNANCY, URINE: Preg Test, Ur: NEGATIVE

## 2011-02-17 NOTE — Consult Note (Signed)
  [name], [name]               ACCOUNT NO.:  [id number]  MEDICAL RECORD NO.:  [id number]           PATIENT TYPE:  LOCATION:                                 FACILITY:  PHYSICIAN:  Carmen Morning, MD     DATE OF BIRTH:  Apr 04, 1956  DATE OF CONSULTATION:  01/19/2011 DATE OF DISCHARGE:                                CONSULTATION   REASONS FOR VISIT:  Postoperative check, urinary frequency.  HISTORY OF PRESENT ILLNESS:  This is a 55 year old who was diagnosed with grade 1 endometrial cancer by Dr. Josefa [name] on an endometrial biopsy on November 23, 2000.  On January 10, 2011, she underwent a robotic-assisted laparoscopic hysterectomy, bilateral salpingo- oophorectomy.  Final pathology was notable for a grade 1 endometrial adenocarcinoma without any lymphovascular space invasion, myometrial invasion was 2 of 7 mm.  There was no involvement of any other additional structures.  Carmen [name] states that she has done well postoperatively, but today noted urinary frequency.  She reports voiding approximately 3 times today.  She denies continuous vaginal leakage. There is no vaginal bleeding.  She denies burning or foul smell or change in color.  She does note that the urinary urgency is quite significant.  There are no fever or chills.  PAST MEDICAL HISTORY:  No interval changes.  PAST SURGICAL HISTORY:  No interval changes.  PHYSICAL EXAMINATION:  GENERAL:  A well-developed female with a very childlike affect and absurdly rude behavior to her husband, otherwise in no acute distress.  The patient is very apprehensive about removal of the Steri-Strips from the laparoscopic port sites. VITAL SIGNS:  Blood pressure 148/78, pulse 80, respiratory rate 20. ABDOMEN:  Soft, nontender.  Incision clean, dry, intact.  No erythema. No cellulitis. PELVIC:  The cuff is intact.  No bleeding within the vaginal vault.  No fluid or discharge.  No pelvic masses.  IMPRESSION:  Carmen [name] is a 55-year  with a stage IA grade 1 endometrial adenocarcinoma with urinary urgency.  A urinalysis and culture were obtained, results will not be available as such a prescription for Bactrim DS 1 tablet twice daily for 3 days was written.  We will follow up with the urine culture to make sure that there is concordance regarding treatment and symptomatology.  Carmen [name] will follow up in approximately 6 weeks.     Carmen Morning, MD     WB/MEDQ  D:  01/19/2011  T:  01/20/2011  Job:  [id number]  cc:   Carmen [name], M.D. Fax: [phone]  Carmen [name], R.N. [address], [place] [address]  Electronically Signed by Carmen Morning MD on 01/23/2011 09:45:24 AM

## 2011-02-23 ENCOUNTER — Ambulatory Visit: Payer: BC Managed Care – PPO | Attending: Gynecologic Oncology | Admitting: Gynecologic Oncology

## 2011-02-23 DIAGNOSIS — Z09 Encounter for follow-up examination after completed treatment for conditions other than malignant neoplasm: Secondary | ICD-10-CM | POA: Insufficient documentation

## 2011-02-23 DIAGNOSIS — Z90710 Acquired absence of both cervix and uterus: Secondary | ICD-10-CM | POA: Insufficient documentation

## 2011-02-23 DIAGNOSIS — C549 Malignant neoplasm of corpus uteri, unspecified: Secondary | ICD-10-CM | POA: Insufficient documentation

## 2011-02-23 DIAGNOSIS — E669 Obesity, unspecified: Secondary | ICD-10-CM | POA: Insufficient documentation

## 2011-02-24 NOTE — Consult Note (Signed)
  NAMEMarland Kitchen  [name], Carmen               ACCOUNT NO.:  [id number]  MEDICAL RECORD NO.:  [id number]           PATIENT TYPE:  LOCATION:                                 FACILITY:  PHYSICIAN:  [name], MD     DATE OF BIRTH:  [birth year]  DATE OF CONSULTATION:  02/23/2011 DATE OF DISCHARGE:                                CONSULTATION   REASON FOR VISIT:  Postoperative check.  HISTORY OF PRESENT ILLNESS:  This is a 55 year old, diagnosed by Dr. Josefa [name], with a grade 1 endometrial cancer.  On January 10, 2011, she underwent a robotic-assisted laparoscopic hysterectomy, bilateral salpingo-oophorectomy.  Final pathology is notable for grade 1 endometrial adenocarcinoma without any lymphovascular space invasion. Myometrial invasion, 2.7 mm.  Pelvic washings were not available.  Final staging was that of a stage IA grade 1.  Ms. Carmen [name] says that she is feeling very well.  She denies vaginal discharge.  There is no vaginal or rectal bleeding.  No cough or abdominal pain.  PHYSICAL EXAMINATION:  GENERAL:  A well-developed female in no acute distress. VITAL SIGNS:  Weight 292 pounds, blood pressure 132/64, pulse of 72. ABDOMEN:  Soft, obese, nontender.  Robotic port sites without any masses, hernia, tenderness, or erythema. PELVIC:  Vaginal cuff is intact.  No discharge is appreciated.  No cul- de-sac masses are appreciated. RECTAL:  Good anal and sphincter tone without any rectal tenderness or pelvic masses.  IMPRESSION:  Ms. Carmen [name] has a stage IA grade 1 endometrioid adenocarcinoma.  I have impressed upon her the fact that given her obesity surveillance by her primary care practitioner is important.  She promises to make an appointment very shortly.  I have asked that she follow up with Dr. Josefa [name] in 6 months and with GYN oncology service in 12 months.  The surveillance plan is described to Ms. Carmen [name] will be that of pelvic and rectal examination evaluation every 6 months.   Annual Pap test, imaging, and biomarkers survey is only necessary as indicated by symptomatology or physical findings.     [name], MD     WB/MEDQ  D:  02/23/2011  T:  02/24/2011  Job:  [id number]  cc:   Carmen [name], M.D. Fax: [phone]  Carmen [name], R.N. [address], [place] [address]  [place]  Electronically Signed by [name] MD on 02/24/2011 10:05:06 AM

## 2011-08-11 ENCOUNTER — Other Ambulatory Visit: Payer: Self-pay | Admitting: Obstetrics and Gynecology

## 2011-08-17 ENCOUNTER — Other Ambulatory Visit: Payer: Self-pay | Admitting: Obstetrics and Gynecology

## 2011-08-17 DIAGNOSIS — R928 Other abnormal and inconclusive findings on diagnostic imaging of breast: Secondary | ICD-10-CM

## 2012-01-29 ENCOUNTER — Encounter: Payer: Self-pay | Admitting: Gynecologic Oncology

## 2012-01-30 ENCOUNTER — Encounter: Payer: Self-pay | Admitting: Gynecologic Oncology

## 2012-01-30 ENCOUNTER — Ambulatory Visit: Payer: BC Managed Care – PPO | Attending: Gynecologic Oncology | Admitting: Gynecologic Oncology

## 2012-01-30 VITALS — BP 140/78 | HR 98 | Temp 97.4°F | Resp 24 | Ht 60.08 in | Wt 311.1 lb

## 2012-01-30 DIAGNOSIS — Z90710 Acquired absence of both cervix and uterus: Secondary | ICD-10-CM | POA: Insufficient documentation

## 2012-01-30 DIAGNOSIS — C541 Malignant neoplasm of endometrium: Secondary | ICD-10-CM

## 2012-01-30 DIAGNOSIS — Z8542 Personal history of malignant neoplasm of other parts of uterus: Secondary | ICD-10-CM | POA: Insufficient documentation

## 2012-01-30 DIAGNOSIS — R0609 Other forms of dyspnea: Secondary | ICD-10-CM | POA: Insufficient documentation

## 2012-01-30 DIAGNOSIS — Z880 Allergy status to penicillin: Secondary | ICD-10-CM | POA: Insufficient documentation

## 2012-01-30 DIAGNOSIS — R0989 Other specified symptoms and signs involving the circulatory and respiratory systems: Secondary | ICD-10-CM | POA: Insufficient documentation

## 2012-01-30 DIAGNOSIS — Z09 Encounter for follow-up examination after completed treatment for conditions other than malignant neoplasm: Secondary | ICD-10-CM | POA: Insufficient documentation

## 2012-01-30 DIAGNOSIS — M1710 Unilateral primary osteoarthritis, unspecified knee: Secondary | ICD-10-CM | POA: Insufficient documentation

## 2012-01-30 DIAGNOSIS — Z6841 Body Mass Index (BMI) 40.0 and over, adult: Secondary | ICD-10-CM | POA: Insufficient documentation

## 2012-01-30 DIAGNOSIS — Z7982 Long term (current) use of aspirin: Secondary | ICD-10-CM | POA: Insufficient documentation

## 2012-01-30 DIAGNOSIS — C55 Malignant neoplasm of uterus, part unspecified: Secondary | ICD-10-CM | POA: Insufficient documentation

## 2012-01-30 NOTE — Patient Instructions (Signed)
F/u in six months

## 2012-01-30 NOTE — Progress Notes (Signed)
Follow-up Note: Gyn-Onc   Carmen [name] 56 y.o. female  CC:  Chief Complaint  Patient presents with  . Endometrial cancer    follow up    HPI: This is a 56 year old, diagnosed by Dr.  Josefa Half, with a grade 1 endometrial cancer. On January 10, 2011,  she underwent a robotic-assisted laparoscopic hysterectomy, bilateral  salpingo-oophorectomy. Final pathology is notable for grade 1  endometrial adenocarcinoma without any lymphovascular space invasion.  Myometrial invasion, 2.7 mm. Pelvic washings were not available. Final  staging was that of a stage IA grade 1.  Interval History:    Carmen [name] says that she is  feeling very well. She denies vaginal discharge. There is no vaginal  or rectal bleeding. No cough or abdominal pain.      Current Meds:  Outpatient Encounter Prescriptions as of 01/30/2012  Medication Sig Dispense Refill  . aspirin 81 MG tablet Take 81 mg by mouth 2 (two) times daily.      . Calcium Carbonate (CALCIUM 500 PO) Take by mouth.      . Cholecalciferol (VITAMIN D PO) Take 800 mg by mouth.      . Cyanocobalamin (VITAMIN B-12 PO) Take 800 mg by mouth.      . Multiple Vitamins-Minerals (MEGA MULTI WOMEN PO) Take 1,200 mg by mouth daily.      . vitamin E 400 UNIT capsule Take 400 Units by mouth 2 (two) times daily.        Allergy:  Allergies  Allergen Reactions  . Clarithromycin     REACTION: respiratory issues, can't breathe  . Penicillins     REACTION: can't breath    Social Hx:   History   Social History  . Marital Status: Married    Spouse Name: N/A    Number of Children: N/A  . Years of Education: N/A   Occupational History  . Not on file.   Social History Main Topics  . Smoking status: Never Smoker   . Smokeless tobacco: Not on file  . Alcohol Use: No  . Drug Use: No  . Sexually Active: No   Other Topics Concern  . Not on file   Social History Narrative  . No narrative on file    Past Surgical Hx:  Past Surgical History    Procedure Date  . Robotic assisted lap vaginal hysterectomy 01/10/2011    BSO    Past Medical Hx:  Past Medical History  Diagnosis Date  . Morbid obesity   . Osteoarthritis of both knees   . Endometrial cancer     Grade 1    Family Hx: History reviewed. No pertinent family history.  Review of Systems:  Constitutional  Feels well, one year older. Skin No rash, sores, jaundice, itching, dryness,  Cardiovascular  No chest pain, shortness of breath, or edema  Pulmonary  No cough or wheeze.  Gastro Intestinal  No nausea, vomitting, or diarrhoea. No bright red blood per rectum, no abdominal pain, change in bowel movement, or constipation.  Genito Urinary  No frequency, dysuria, incontinence with stress. Musculo Skeletal  No myalgia, arthralgia, joint swelling or pain  Neurologic  No weakness, numbness, change in gait,  Psychology  No depression, anxiety, insomnia.     Vitals:  Blood pressure 140/78, pulse 98, temperature 97.4 F (36.3 C), temperature source Oral, resp. rate 24, height 5' 0.08" (1.526 m), weight 311 lb 1.6 oz (141.114 kg).  Physical Exam: WD female in NAD Neck  Supple without any enlargements.  Lymph node survey. No cervical supraclavicular cervical or inguinal adenopathy Cardiovascular  Pulse normal rate, regularity and rhythm. S1 and S2 normal. Lungs  Clear to auscultation bilaterally.  Good air movement.  Skin  No rash/lesions/breakdown  Psychiatry  Alert and oriented to person, place, and time  Back No CVA tenderness Abdomen  Normoactive bowel sounds, abdomen soft, non-tender and obese. Surgical  sites intact without evidence of hernia.  Genito Urinary  Vulva/vagina: Normal external female genitalia.  No lesions.   Bladder/urethra:  No lesions or masses  Vagina: atrophic, no masses Rectal  Good tone, no masses no cul de sac nodularity.  Extremities  No bilateral cyanosis, clubbing or edema.    Assessment/Plan:  This is a 56 y.o.  year old with Stage IA endometrial cancer.  NO evidence of disease. F/u in six months F/U with Dr. Lynnae Sandhoff for mammography.  Carmen Morning, MD., PhD. 01/30/2012, 9:44 AM

## 2013-02-20 ENCOUNTER — Ambulatory Visit: Payer: BC Managed Care – PPO | Admitting: Gynecologic Oncology

## 2013-02-20 ENCOUNTER — Ambulatory Visit: Payer: Medicare Other | Attending: Gynecologic Oncology | Admitting: Gynecologic Oncology

## 2013-02-20 ENCOUNTER — Encounter: Payer: Self-pay | Admitting: Gynecologic Oncology

## 2013-02-20 VITALS — BP 152/80 | HR 92 | Temp 97.7°F | Resp 20 | Ht 60.08 in | Wt 324.2 lb

## 2013-02-20 DIAGNOSIS — IMO0002 Reserved for concepts with insufficient information to code with codable children: Secondary | ICD-10-CM

## 2013-02-20 DIAGNOSIS — C549 Malignant neoplasm of corpus uteri, unspecified: Secondary | ICD-10-CM | POA: Insufficient documentation

## 2013-02-20 DIAGNOSIS — Z90710 Acquired absence of both cervix and uterus: Secondary | ICD-10-CM | POA: Insufficient documentation

## 2013-02-20 NOTE — Progress Notes (Signed)
Follow-up Note: Gyn-Onc   Carmen [name] 57 y.o. female  CC:  Chief Complaint  Patient presents with  . Endometriod Adenocarcinoma    Follow up    HPI: This is a 57 year old, diagnosed by Dr. Josefa Half, with a grade 1 endometrial cancer. On January 10, 2011, she underwent a robotic-assisted laparoscopic hysterectomy, bilateral salpingo-oophorectomy. Final pathology is notable for grade 1 endometrial adenocarcinoma without any lymphovascular space invasion. Myometrial invasion, 2.7 mm. Pelvic washings were not available. Final staging was that of a stage IA grade 1.   Social History Accompanied by her partner.  Does not exercise Past Surgical Hx:  Past Surgical History  Procedure Laterality Date  . Robotic assisted lap vaginal hysterectomy  01/10/2011    BSO    Past Medical Hx:  Past Medical History  Diagnosis Date  . Morbid obesity   . Osteoarthritis of both knees   . Endometrial cancer     Grade 1    Family Hx: No family history on file.  Review of Systems:  Constitutional  Feels well,weight gain Skin No rash, sores, jaundice, itching, dryness,  Cardiovascular  No chest pain, shortness of breath, stable edema of the lower exermities Pulmonary  No cough or wheeze.  Gastro Intestinal  No nausea, vomitting, or diarrhoea. No bright red blood per rectum, no abdominal pain, change in bowel movement, or constipation.  Genito Urinary  No frequency, dysuria, incontinence with stress. Musculo Skeletal  No myalgia, arthralgia, joint swelling reports knee pain that limits her ability to ambulate Neurologic  No weakness, numbness, change in gait,  Psychology  No depression, anxiety, insomnia.     Vitals:  Blood pressure 152/80, pulse 92, temperature 97.7 F (36.5 C), temperature source Oral, resp. rate 20, height 5' 0.08" (1.526 m), weight 324 lb 3.2 oz (147.056 kg).  Physical Exam: WD female in NAD Neck  Supple without any enlargements.  Lymph node survey. No  cervical supraclavicular cervical or inguinal adenopathy Cardiovascular  Pulse normal rate, regularity and rhythm.  Lungs  Clear to auscultation bilaterally.  Good air movement.  Skin  Brawny edema of the lower extremities. Psychiatry  Alert and oriented to person, place, and time  Back No CVA tenderness Abdomen  Normoactive bowel sounds, abdomen soft, non-tender and obese. Surgical  sites intact without evidence of hernia.  Genito Urinary  Vulva/vagina: Normal external female genitalia.  No lesions.   Bladder/urethra:  No lesions or masses  Vagina: atrophic, no masses examination limited by her obesity Extremities 2-3+ edema bilaterally with erythema and thick skin changes c/w stasis.    Assessment/Plan:  This is a 57 y.o. year old with Stage IA endometrial cancer.  No evidence of disease. States that pap test was collected by Dr. Myles Rosenthal F/u in 12 months Patient advised to seriously consider the mammography and colorectal cancer screening Offered referral to bariatric service but declined  Carmen Morning, MD., PhD. 02/20/2013, 9:16 AM

## 2013-02-20 NOTE — Patient Instructions (Signed)
Stage IA endometrial cancer.  No evidence of disease. F/u in 12 months Seriously consider the mammography and colorectal cancer screening Consider referral to bariatric service but declined   Mammography Screening Recommendations Women ages 66 to 41 should get screening mammograms every one to two years if they are at average risk for breast cancer. For women 50 years and older, mammograms are recommended every one to two years. Women who are at higher than average risk should start mammography before age 53. They should also find out how often they should get screened in their 40's and 50's.  HIGHER RISK WOMEN ARE THOSE WHO:  Have had breast cancer.  Have more than one family member that has or has had breast cancer.  Carry genetic changes, which make them more likely to get breast cancer.  Have a breast disease that may predispose them to cancer.  Have had two or more breast biopsies for benign disease.  Have 75% or more dense breast tissue on past mammograms.  Gave birth at age 86 or older.  Have never been pregnant.  Are on hormone therapy.  Began their menstrual periods before age 63.  Have a late menopause (19 years old or older).  Have had high doses of radiation to the chest.  Never breastfed.  Are going through menopause and are obese.  Have had cancer of the uterus, ovary or intestine.  Are Jewish.  Drink a lot of alcohol.  Have breast implants. Women without the risk factors above are considered to be at average risk of developing breast cancer. MAMMOGRAPHY LIMITATIONS There are limitations of mammography. That is why clinical breast exams by your caregiver and monthly self breast exams are important. Some limitations include:  Having a high percentage of women who get mammograms with results that are not cancer, but need further testing such as:  Another mammogram.  Fine needle aspiration.  Ultrasound.  Biopsy.  Women who have yearly mammograms  in their 5's have about a 30% chance of having a "false-positive" mammogram. This means the mammogram suggests a problem, but on further testing, there is no problem. This is called a "false positive."  About 25% of breast tumors are missed in women in their 53's, compared to 10% for women in their 23's. This is due to dense breast tissue. RESEARCH  Regular screening of average risk women in their 40's reduces deaths from breast cancer by about 17%.  Research is under way in imaging technology such as MRI's, breast ultrasound and breast-specific position emission tomography to overcome mammography limitations.  In addition to imaging technologies, scientists are exploring methods to detect traces of breast cancer in blood, urine, or nipple fluid and to detect genetic alterations in women who are at increased risk for breast cancer.  There is little to no risk of getting too much radiation during a screening.  Regular screening and early detection of breast cancer reduces costs and further therapy such as radiation, chemotherapy and breast reconstruction. For further information contact the Cancer Information Service (CIS).  CIS, a national information and education network, is a Optometrist of the [employer]. This is the federal government's primary agency for cancer research.  The CIS meets the information needs of patients, the public and health professionals.  Trained staff provides the latest scientific information in understandable language. CIS staff answers questions in [place] and Spanish and gives out [employer].  1-800-4-CANCER 267-639-6865).  TTYWJ:6962563.  https://anderson-clarke.com/.  ThirdIncome.ca. Document Released: 01/05/2006 Document Revised: 02/05/2012  Document Reviewed: 11/10/2008 [facility] Patient Information 2013 [place].  Colorectal Cancer Screening Colorectal cancer screening is done to detect early disease. Colorectal refers to  the colon and rectum. The colon and rectum are located at the end of the large intestine (digestive system), and carry your bowel movements out of the body. Screening may be done even if you are not experiencing symptoms.  Colorectal cancer screening checks for:  Polyps. These are small growths in the lining of the colon that can turn cancerous.  Cancer that is already growing. Cancer is a cluster of abnormal cells that can cause problems in the body. REASONS FOR COLORECTAL CANCER SCREENING  It is common for polyps to form in the lining of the colon, especially in older people. These polyps can be cancerous or become cancerous.  Caught early, colorectal cancer is treatable.  Cancer can be life threatening. Detecting or preventing cancer early can save your life and allow you to enjoy life longer. TYPES OF SCREENING  Fecal occult blood testing. A stool sample is examined for blood in the laboratory.  Sigmoidoscopy. A sigmoidoscope is used to examine the rectum and lower colon. A sigmoidoscope is a flexible tube with a camera that is inserted through your anus to examine your lower rectum.  Colonoscopy. The longer colonoscope is used to examine the entire colon. A colonoscope is also a thin, flexible tube with a camera. This test examines the colon and rectum. Other tests include:  Digital rectal exam.  Barium enema.  Stool DNA test.  Virtual colonoscopy is the use of computerized X-ray scan (computed tomography, CT) to take X-ray images of your colon. WHO SHOULD HAVE COLORECTAL CANCER SCREENING?  Screening is recommended for all adults aged 66 to 109 years.  Screening is generally done every 5 to 10 years or more frequently if you have a family history or symptoms.  Screening is rarely recommended in adults aged 3 to 89 years. Screening is not recommended in adults aged 15 years and older. Your caregiver may recommend screening at a younger age and more frequent screening if you  have:  A history of colorectal cancer or polyps.  Family members with histories of colorectal cancer or polyps.  Inflammatory bowel disease, such as ulcerative colitis or Crohn's disease.  A type of hereditary colon cancer syndrome. Talk with your caregiver about any symptoms, personal and family history. SYMPTOMS OF COLORECTAL CANCER It is important to discuss the following symptoms with your caregiver. These symptoms may be the result of other conditions and may be easily treated:  Rectal bleeding.  Blood in your stool.  Changes in bowel movements (hard or loose stools). These changes may last several weeks.  Abdominal cramping.  Feeling the pressure to have a bowel movement when there is no bowel movement.  Feeling tired or weak.  Unexplained weight loss.  Unexplained low red blood cell count. This may also be called iron deficiency anemia. HOME CARE INSTRUCTIONS   Follow up with your caregiver as directed.  Follow all instructions for preparation before your test as well as after. PREVENTION  Following healthy lifestyle habits each day can reduce your chance of getting colorectal cancer and many other types of cancer:  Eat a healthy, well-balanced diet rich in fruits and vegetables and low in fats, sugars and cholesterol.  Stay active. Try to exercise at least 4 to 6 times per week for 30 minutes.  Maintain a healthy weight. Ask your caregiver what a healthy weight range is for  you.  Women should only drink 1 alcoholic drink per day. Men should only drink 2 alcoholic drinks per day.  Quit smoking. SEEK MEDICAL CARE IF:   You experience abdominal or rectal symptoms (see Symptoms of Colorectal Cancer).  Your gastrointestinal issues (constipation, diarrhea) do not go away as expected.  You have questions or concerns. FOR MORE INFORMATION  American Academy of Family Physicians www.familydoctor.org  Centers for Disease Control and Prevention http://www.wolf.info/  [place]  Preventive Services Task Force www.uspreventiveservicestaskforce.[place] www.cancer.org MAKE SURE YOU:   Understand these instructions.  Will watch your condition.  Will get help right away if you are not doing well or get worse. Always follow up with your caregiver to find out the results of your tests. Not all test results may be available during your visit. If your test results are not back during the visit, make an appointment with your caregiver to find out the results. Do not assume everything is normal if you have not heard from your caregiver or the medical facility. It is important for you to follow up on all of your test results.  Document Released: 05/03/2010 Document Revised: 02/05/2012 Document Reviewed: 05/03/2010 [facility] Patient Information 2013 [place].

## 2013-12-03 ENCOUNTER — Encounter: Payer: Self-pay | Admitting: *Deleted

## 2014-02-19 ENCOUNTER — Ambulatory Visit: Payer: Medicare Other | Admitting: Gynecologic Oncology

## 2014-03-10 ENCOUNTER — Inpatient Hospital Stay: Payer: Self-pay | Admitting: Internal Medicine

## 2014-03-10 LAB — TROPONIN I: Troponin-I: 0.03 ng/mL

## 2014-03-10 LAB — BASIC METABOLIC PANEL
ANION GAP: 4 — AB (ref 7–16)
BUN: 15 mg/dL (ref 7–18)
CHLORIDE: 107 mmol/L (ref 98–107)
CO2: 27 mmol/L (ref 21–32)
Calcium, Total: 8.5 mg/dL (ref 8.5–10.1)
Creatinine: 0.5 mg/dL — ABNORMAL LOW (ref 0.60–1.30)
EGFR (African American): >60
EGFR (Non-African Amer.): >60
Glucose: 116 mg/dL — ABNORMAL HIGH (ref 65–99)
Osmolality: 277 (ref 275–301)
POTASSIUM: 5 mmol/L (ref 3.5–5.1)
SODIUM: 138 mmol/L (ref 136–145)

## 2014-03-10 LAB — PRO B NATRIURETIC PEPTIDE: B-TYPE NATIURETIC PEPTID: 897 pg/mL — AB (ref 0–125)

## 2014-03-10 LAB — CBC
HCT: 42.7 % (ref 35.0–47.0)
HGB: 13.7 g/dL (ref 12.0–16.0)
MCH: 29.1 pg (ref 26.0–34.0)
MCHC: 32.1 g/dL (ref 32.0–36.0)
MCV: 91 fL (ref 80–100)
PLATELETS: 186 10*3/uL (ref 150–440)
RBC: 4.7 10*6/uL (ref 3.80–5.20)
RDW: 16.8 % — ABNORMAL HIGH (ref 11.5–14.5)
WBC: 8.6 10*3/uL (ref 3.6–11.0)

## 2014-03-11 LAB — LIPID PANEL
CHOLESTEROL: 125 mg/dL (ref 0–200)
HDL Cholesterol: 26 mg/dL — ABNORMAL LOW (ref 40–60)
Ldl Cholesterol, Calc: 84 mg/dL (ref 0–100)
TRIGLYCERIDES: 73 mg/dL (ref 0–200)
VLDL CHOLESTEROL, CALC: 15 mg/dL (ref 5–40)

## 2014-03-11 LAB — TSH: THYROID STIMULATING HORM: 4.14 u[IU]/mL

## 2014-03-11 LAB — HEMOGLOBIN A1C: Hemoglobin A1C: 6.4 % — ABNORMAL HIGH (ref 4.2–6.3)

## 2014-03-11 LAB — MAGNESIUM: Magnesium: 1.8 mg/dL

## 2014-03-11 LAB — D-DIMER(ARMC): D-Dimer: 763 ng/ml

## 2014-03-12 LAB — CBC WITH DIFFERENTIAL/PLATELET
BASOS ABS: 0 10*3/uL (ref 0.0–0.1)
Basophil %: 0.4 %
Eosinophil #: 0.4 10*3/uL (ref 0.0–0.7)
Eosinophil %: 4.1 %
HCT: 42.2 % (ref 35.0–47.0)
HGB: 13.7 g/dL (ref 12.0–16.0)
LYMPHS PCT: 11.7 %
Lymphocyte #: 1.1 10*3/uL (ref 1.0–3.6)
MCH: 29.6 pg (ref 26.0–34.0)
MCHC: 32.5 g/dL (ref 32.0–36.0)
MCV: 91 fL (ref 80–100)
MONO ABS: 0.6 x10 3/mm (ref 0.2–0.9)
MONOS PCT: 6.6 %
NEUTROS ABS: 7 10*3/uL — AB (ref 1.4–6.5)
Neutrophil %: 77.2 %
Platelet: 219 10*3/uL (ref 150–440)
RBC: 4.64 10*6/uL (ref 3.80–5.20)
RDW: 17 % — ABNORMAL HIGH (ref 11.5–14.5)
WBC: 9 10*3/uL (ref 3.6–11.0)

## 2014-03-12 LAB — BASIC METABOLIC PANEL
Anion Gap: 6 — ABNORMAL LOW (ref 7–16)
BUN: 19 mg/dL — AB (ref 7–18)
CHLORIDE: 102 mmol/L (ref 98–107)
CREATININE: 0.9 mg/dL (ref 0.60–1.30)
Calcium, Total: 9.1 mg/dL (ref 8.5–10.1)
Co2: 33 mmol/L — ABNORMAL HIGH (ref 21–32)
EGFR (Non-African Amer.): >60
Glucose: 114 mg/dL — ABNORMAL HIGH (ref 65–99)
Osmolality: 284 (ref 275–301)
POTASSIUM: 3.8 mmol/L (ref 3.5–5.1)
SODIUM: 141 mmol/L (ref 136–145)

## 2014-03-12 LAB — MAGNESIUM: Magnesium: 2 mg/dL

## 2014-03-13 LAB — BASIC METABOLIC PANEL
Anion Gap: 2 — ABNORMAL LOW (ref 7–16)
BUN: 22 mg/dL — AB (ref 7–18)
CO2: 37 mmol/L — AB (ref 21–32)
Calcium, Total: 8.7 mg/dL (ref 8.5–10.1)
Chloride: 102 mmol/L (ref 98–107)
Creatinine: 0.93 mg/dL (ref 0.60–1.30)
GLUCOSE: 109 mg/dL — AB (ref 65–99)
OSMOLALITY: 285 (ref 275–301)
Potassium: 3.8 mmol/L (ref 3.5–5.1)
Sodium: 141 mmol/L (ref 136–145)

## 2014-03-13 LAB — MAGNESIUM: Magnesium: 2.1 mg/dL

## 2014-03-14 LAB — BASIC METABOLIC PANEL
Anion Gap: 0 — ABNORMAL LOW (ref 7–16)
BUN: 25 mg/dL — AB (ref 7–18)
CALCIUM: 8.7 mg/dL (ref 8.5–10.1)
CHLORIDE: 105 mmol/L (ref 98–107)
CO2: 39 mmol/L — AB (ref 21–32)
Creatinine: 0.88 mg/dL (ref 0.60–1.30)
EGFR (African American): >60
EGFR (Non-African Amer.): >60
Glucose: 108 mg/dL — ABNORMAL HIGH (ref 65–99)
OSMOLALITY: 292 (ref 275–301)
Potassium: 4 mmol/L (ref 3.5–5.1)
Sodium: 144 mmol/L (ref 136–145)

## 2014-12-14 DIAGNOSIS — R0902 Hypoxemia: Secondary | ICD-10-CM | POA: Diagnosis not present

## 2015-01-14 DIAGNOSIS — R0902 Hypoxemia: Secondary | ICD-10-CM | POA: Diagnosis not present

## 2015-02-05 DIAGNOSIS — E784 Other hyperlipidemia: Secondary | ICD-10-CM | POA: Diagnosis not present

## 2015-02-05 DIAGNOSIS — I5022 Chronic systolic (congestive) heart failure: Secondary | ICD-10-CM | POA: Diagnosis not present

## 2015-02-05 DIAGNOSIS — I10 Essential (primary) hypertension: Secondary | ICD-10-CM | POA: Diagnosis not present

## 2015-02-05 DIAGNOSIS — R5381 Other malaise: Secondary | ICD-10-CM | POA: Diagnosis not present

## 2015-02-12 DIAGNOSIS — R0902 Hypoxemia: Secondary | ICD-10-CM | POA: Diagnosis not present

## 2015-03-15 DIAGNOSIS — R0902 Hypoxemia: Secondary | ICD-10-CM | POA: Diagnosis not present

## 2015-03-20 NOTE — Consult Note (Signed)
PATIENT NAME:  Carmen [name], Carmen [name] MR#:  [id number] DATE OF BIRTH:  [birth year]  DATE OF CONSULTATION:  03/10/2014  REFERRING PHYSICIAN:   CONSULTING PHYSICIAN:  Isaias Cowman, MD  PRIMARY CARE PHYSICIAN: None.  CHIEF COMPLAINT: Shortness of breath.   REASON FOR CONSULTATION: Consultation requested for evaluation because of some heart failure.   HISTORY OF PRESENT ILLNESS: The patient is a 59 year old female with history of hypertension admitted this time with 1-week history of worsening shortness of breath. The patient reports that she has been in her usual state of health until 1 to 3 weeks prior to admission when she started to experience peripheral edema and then more recently, shortness of breath. She presented to [facility] Emergency Room where she was noted to be in pedal edema. The patient was hypoxic with O2 saturation 88%. Chest x-ray did reveal evidence for pulmonary edema. The patient was admitted to telemetry where she has ruled out for myocardial infarction by CPK, isoenzymes and troponin. The patient was treated with intravenous furosemide with diuresis and overall clinical improvement. Echocardiogram was performed, which revealed normal left ventricular function. The patient denies chest pain.   PAST MEDICAL HISTORY:  1.  Hypertension.  2.  Obesity.   MEDICATIONS ON ADMISSION: Allegra 180 mg daily.   SOCIAL HISTORY: The patient denies tobacco or EtOH abuse.   FAMILY HISTORY: No immediate family history of myocardial infarction or coronary artery disease.   REVIEW OF SYSTEMS: CONSTITUTIONAL: No fever or chills. EYES: No blurry vision. EARS: No hearing loss. RESPIRATORY: Shortness of breath as described above.  CARDIOVASCULAR: The patient denies chest pain. She does have pedal edema. GASTROINTESTINAL: No nausea, vomiting, or diarrhea. GENITOURINARY: No dysuria or hematuria. ENDOCRINE: No polyuria or polydipsia. MUSCULOSKELETAL: No arthralgias or myalgias. NEUROLOGIC: No focal muscle  weakness or numbness. PSYCHOLOGICAL: No depression or anxiety.   PHYSICAL EXAMINATION:  VITAL SIGNS: Blood pressure 152/70, pulse 68, respirations 18, temperature 97.9, pulse ox 92%.  HEENT: Pupils equal, reactive to light and accommodation.  NECK: Supple without thyromegaly.  LUNGS: Clear.  HEART: Normal JVP. Normal PMI. Regular rate and rhythm. Normal S1, S2. No appreciable gallop, murmur, or rub.  ABDOMEN: Soft and nontender.  EXTREMITIES: There is trace to 1+ bilateral pedal edema.  MUSCULOSKELETAL: Normal muscle tone.  NEUROLOGIC: The patient is alert and oriented x 3. Motor and sensory both grossly intact.   IMPRESSION: A 59 year old female with history of hypertension and obesity with normal left ventricular function by echocardiogram in the absence of chest pain and negative Troponin, who presents with 1 to 3 week history of progressive peripheral edema and shortness of breath consistent with acute on chronic diastolic congestive heart failure. Symptoms improved after initial diuresis.   RECOMMENDATIONS:  1.  I agree with overall current therapy.  2.  Continue diuresis.  3.  Counseled the patient about the importance of low-sodium diet.  4.  Continue carvedilol and lisinopril.  5.  Would defer further cardiac diagnostics at this time.  ____________________________ Isaias Cowman, MD ap:aw D: 03/11/2014 13:43:47 ET T: 03/11/2014 13:51:46 ET JOB#: [id number]  cc: Isaias Cowman, MD, <Dictator> Isaias Cowman MD ELECTRONICALLY SIGNED 03/31/2014 12:46

## 2015-03-20 NOTE — Discharge Summary (Signed)
PATIENT NAME:  Carmen [name], Carmen [name] MR#:  [id number] DATE OF BIRTH:  1955/12/12  DATE OF ADMISSION:  03/10/2014  DATE OF DISCHARGE:  03/14/2014  DISCHARGE DIAGNOSES: 1.  Acute diastolic heart failure.  2.  Acute respiratory failure secondary to congestive heart failure. 3.  Morbid obesity.  4.  Leg cellulitis.   DISCHARGE MEDICATIONS: 1.  Allegra 180 mg p.o. daily.  2.  Correctol 0.5 mg p.o. b.i.d.  3.  Cleocin 300 mg p.o. every 6 hours for 7 days.  4.  Lasix 40 mg p.o. daily.  5.  KCl 10 mEq p.o. daily while on Lasix.  6.  Lisinopril 20 mg p.o. daily.  All the medications are new, except Allegra.    The patient will be discharged home with 2 liters of oxygen.   CONSULTATIONS: Cardiology consult with Dr. Saralyn Pilar.   HOSPITAL COURSE: A 59 year old female who is morbidly obese, with a BMI of 66, comes in because of trouble breathing for about 3 weeks, getting worse progressively. Look at the History and Physical for details. The patient is a morbidly obese female, comes in because of trouble breathing and also leg swelling with some erythema. The patient also has hypoxia, with O2 sats 88% in the Emergency Room. She was found to have elevated BNP 897 on admission, and blood pressure also was elevated at 210/91 on admission. She was admitted for new onset congestive heart failure and acute failure secondary to CHF, with malignant hypertension and bilateral leg cellulitis.   1.  Acute CHF. The patient was started on IV Lasix 40 mg q. 8 hours, and started on aspirin along with Coreg and lisinopril and hydralazine. EKG showed normal sinus rhythm at 85 beats on admission. She was ruled out for MI with negative troponins x 3. She had an echocardiogram, which showed EF of 70%, with normal LV function, and her LDL was 84. She was seen by Dr. Saralyn Pilar. He agreed with continuing the diuretics and low sodium diet. The patient did not have any further cardiac workup. The patient's symptoms nicely improved with  Lasix. We tried to discharge her yesterday, but the patient needed oxygen because her O2 sats were 81% on room air on exertion, and quickly improved to 96% at rest on 3 liters. We tried to wean oxygen, but  patient needs oxy 2 litres/ The patient is to see Dr. Saralyn Pilar in the clinic in about 1 to 2 weeks. We already gave education about CHF, and patient will follow up with him.   2.  Morbid obesity with leg cellulitis. Her CHF is probably due to her obesity as well. The patient had leg cellulitis. She was started on IV Unasyn, and discharging her home with clindamycin because she is allergic to PENICILLINS and [place].   3.  The patient's renal function stayed stable, and she did not have any leukocytosis. The patient also had a V/Q scan because of her trouble breathing, but V/Q scan is low probability for PE.   VITAL SIGNS: Heart rate 61, blood pressure 143/82, sats 96% on 2 liters, and temperature 98.2.   DISPOSITION: The patient was discharged home in stable condition.    ____________________________ Epifanio Lesches, MD sk:mr D: 03/15/2014 08:04:02 ET T: 03/15/2014 16:56:18 ET JOB#: [id number]  cc: Epifanio Lesches, MD, <Dictator> Epifanio Lesches MD ELECTRONICALLY SIGNED 03/29/2014 22:41

## 2015-03-20 NOTE — H&P (Signed)
PATIENT NAME:  Carmen [name], LEHMKUHL MR#:  [id number] DATE OF BIRTH:  11-28-1955  DATE OF ADMISSION:  03/10/2014  PRIMARY CARE PHYSICIAN: None.  REFERRING PHYSICIAN: Latina Craver, MD  CHIEF COMPLAINT: Shortness of breath for 3 weeks, worsening 1 week.   HISTORY OF PRESENT ILLNESS: This is a 59 year old Caucasian female with a history of hypertension who presented to the ED with shortness of breath for 3 weeks, worsening 1 week. The patient is alert, awake, oriented, in no acute distress. The patient says that she has no PCP and has not seen a physician for a long time. The patient developed leg swelling 3 months ago with some erythema, but the patient denies any fever. However, the patient started to have shortness of breath 3 weeks ago, which has been worsening for the past 1 week. The patient complains of cough, sputum, and shortness of breath and weight gain. The patient also has orthopnea and dyspnea while walking only a few steps. The patient complains of headache. Her O2 sat was 88 in ED. Chest x-ray showed pulmonary edema.   PAST MEDICAL HISTORY: Hypertension.   PAST SURGICAL HISTORY: Hysterectomy.  SOCIAL HISTORY: No smoking, alcohol drinking or illicit drugs.   FAMILY HISTORY: Father had heart disease/heart problem.   ALLERGIES: BIAXIN, PENICILLIN.  HOME MEDICATIONS: Allegra 180 mg p.o. once a day.   REVIEW OF SYSTEMS:    CONSTITUTIONAL: The patient denies any fever or chills. Has headache and dizziness and weakness.  EYES: No double vision or blurred vision.  EARS, NOSE, THROAT: No postnasal drip, slurred speech or dysphagia.  CARDIOVASCULAR: No chest pain or palpitations, but has orthopnea, nocturnal dyspnea, and leg edema.  PULMONARY: Positive for cough, sputum, shortness of breath, and wheezing. No hemoptysis.  GASTROINTESTINAL: No abdominal pain, nausea, vomiting, diarrhea. No melena or bloody stool.  GENITOURINARY: No dysuria, hematuria, or incontinence.  SKIN: No rash or  jaundice.  NEUROLOGIC: No syncope, loss of consciousness, or seizure.  HEMATOLOGIC: No easy bruising or bleeding.  ENDOCRINE: No polyuria, polydipsia, heat or cold intolerance.  SKIN: No rash or jaundice.   PHYSICAL EXAMINATION: VITAL SIGNS: Temperature 98.6. Blood pressure was 210/91 and now decreased to 180/90, pulse 76, O2 saturation 95% on oxygen, respirations 23. GENERAL: The patient is alert, awake, oriented, in no acute distress.  HEENT: Pupils round, equal, reactive to light and accommodation. Moist oral mucosa. Clear oropharynx.  NECK: Supple. No JVD or carotid bruit. No lymphadenopathy. No thyromegaly.  CARDIOVASCULAR: S1, S2, regular rate and rhythm. No murmurs or gallops.  PULMONARY: Bilateral air entry. No wheezing, but has rales in bilateral bases. No use of accessory muscles to breathe.  ABDOMEN: Obese. Bowel sounds present. No organomegaly. No distention. No tenderness. EXTREMITIES: Bilateral leg edema, 2 to 3+, with mild erythema and some rash. No calf tenderness. No clubbing or cyanosis. It is difficult to estimate whether the patient has pedal pulses due to edema and obesity.  NEUROLOGY: A and O x 3. No focal deficits. Power 5/5. Sensation intact.   LABORATORY AND RADIOLOGICAL DATA: Chest x-ray shows mild cardiomegaly with suggestion of mild vascular congestion. CBC in normal range. Troponin less than 0.02. Glucose 116, BUN 15, creatinine 0.5, electrolytes normal. BNP 897. IMPRESSION: 1.  New-onset acute congestive heart failure.  2.  Hypoxia due to congestive heart failure.  3.  Malignant hypertension. 4.  Bilateral leg cellulitis.  5.  Morbid obesity.   PLAN OF TREATMENT: 1.  The patient will be admitted to telemetry floor. We will start  CHF protocol. Give Lasix 40 mg IV q.8 hours with nebulizer treatment. Follow up BMP and  magnesium level. May decreased the dose if the patient's renal function becomes worse.  2.  We will get an echocardiograph and a cardiology  consult from Dr. Saralyn Pilar.  3.  We will start aspirin.  4.  Follow up troponin level and check lipid panel, TSH.  5.  We will start Coreg, lisinopril, and give hydralazine IV p.r.n.  6.  For mild leg cellulitis, we will start Unasyn.  7.  I discussed the patient's condition and plan of treatment with the patient and the patient's husband.   TIME SPENT: About 56 minutes.    ____________________________ Demetrios Loll, MD qc:jcm D: 03/10/2014 15:30:50 ET T: 03/10/2014 15:46:20 ET JOB#: [id number]  cc: Demetrios Loll, MD, <Dictator> Demetrios Loll MD ELECTRONICALLY SIGNED 03/12/2014 18:05

## 2015-04-09 DIAGNOSIS — I509 Heart failure, unspecified: Secondary | ICD-10-CM | POA: Diagnosis not present

## 2015-04-14 DIAGNOSIS — R0902 Hypoxemia: Secondary | ICD-10-CM | POA: Diagnosis not present

## 2015-05-07 DIAGNOSIS — I509 Heart failure, unspecified: Secondary | ICD-10-CM | POA: Diagnosis not present

## 2015-05-07 DIAGNOSIS — I5030 Unspecified diastolic (congestive) heart failure: Secondary | ICD-10-CM | POA: Diagnosis not present

## 2015-05-15 DIAGNOSIS — R0902 Hypoxemia: Secondary | ICD-10-CM | POA: Diagnosis not present

## 2015-06-14 DIAGNOSIS — R0902 Hypoxemia: Secondary | ICD-10-CM | POA: Diagnosis not present

## 2015-06-18 DIAGNOSIS — I509 Heart failure, unspecified: Secondary | ICD-10-CM | POA: Diagnosis not present

## 2015-07-15 DIAGNOSIS — R0902 Hypoxemia: Secondary | ICD-10-CM | POA: Diagnosis not present

## 2015-08-15 DIAGNOSIS — R0902 Hypoxemia: Secondary | ICD-10-CM | POA: Diagnosis not present

## 2015-09-14 DIAGNOSIS — R0902 Hypoxemia: Secondary | ICD-10-CM | POA: Diagnosis not present

## 2015-10-12 DIAGNOSIS — Z23 Encounter for immunization: Secondary | ICD-10-CM | POA: Diagnosis not present

## 2015-10-12 DIAGNOSIS — I5030 Unspecified diastolic (congestive) heart failure: Secondary | ICD-10-CM | POA: Diagnosis not present

## 2015-10-15 DIAGNOSIS — R0902 Hypoxemia: Secondary | ICD-10-CM | POA: Diagnosis not present

## 2015-11-14 DIAGNOSIS — R0902 Hypoxemia: Secondary | ICD-10-CM | POA: Diagnosis not present

## 2015-12-15 DIAGNOSIS — R0902 Hypoxemia: Secondary | ICD-10-CM | POA: Diagnosis not present

## 2016-01-15 DIAGNOSIS — R0902 Hypoxemia: Secondary | ICD-10-CM | POA: Diagnosis not present

## 2016-01-17 DIAGNOSIS — I509 Heart failure, unspecified: Secondary | ICD-10-CM | POA: Diagnosis not present

## 2016-01-17 DIAGNOSIS — E784 Other hyperlipidemia: Secondary | ICD-10-CM | POA: Diagnosis not present

## 2016-01-17 DIAGNOSIS — R5381 Other malaise: Secondary | ICD-10-CM | POA: Diagnosis not present

## 2016-01-17 DIAGNOSIS — I10 Essential (primary) hypertension: Secondary | ICD-10-CM | POA: Diagnosis not present

## 2016-01-24 DIAGNOSIS — I10 Essential (primary) hypertension: Secondary | ICD-10-CM | POA: Diagnosis not present

## 2016-01-24 DIAGNOSIS — E663 Overweight: Secondary | ICD-10-CM | POA: Diagnosis not present

## 2016-01-24 DIAGNOSIS — I509 Heart failure, unspecified: Secondary | ICD-10-CM | POA: Diagnosis not present

## 2016-02-12 DIAGNOSIS — R0902 Hypoxemia: Secondary | ICD-10-CM | POA: Diagnosis not present

## 2016-03-14 DIAGNOSIS — R0902 Hypoxemia: Secondary | ICD-10-CM | POA: Diagnosis not present

## 2016-04-13 DIAGNOSIS — R0902 Hypoxemia: Secondary | ICD-10-CM | POA: Diagnosis not present

## 2016-05-14 DIAGNOSIS — R0902 Hypoxemia: Secondary | ICD-10-CM | POA: Diagnosis not present

## 2016-05-17 ENCOUNTER — Emergency Department
Admission: EM | Admit: 2016-05-17 | Discharge: 2016-05-17 | Disposition: A | Discharge status: Home / Self Care | Payer: Commercial Managed Care - HMO | Attending: Emergency Medicine | Admitting: Emergency Medicine

## 2016-05-17 DIAGNOSIS — W01198A Fall on same level from slipping, tripping and stumbling with subsequent striking against other object, initial encounter: Secondary | ICD-10-CM | POA: Insufficient documentation

## 2016-05-17 DIAGNOSIS — W19XXXA Unspecified fall, initial encounter: Secondary | ICD-10-CM

## 2016-05-17 DIAGNOSIS — M79604 Pain in right leg: Secondary | ICD-10-CM | POA: Diagnosis not present

## 2016-05-17 DIAGNOSIS — S81812A Laceration without foreign body, left lower leg, initial encounter: Secondary | ICD-10-CM | POA: Diagnosis not present

## 2016-05-17 DIAGNOSIS — M170 Bilateral primary osteoarthritis of knee: Secondary | ICD-10-CM | POA: Insufficient documentation

## 2016-05-17 DIAGNOSIS — Z7982 Long term (current) use of aspirin: Secondary | ICD-10-CM | POA: Insufficient documentation

## 2016-05-17 DIAGNOSIS — Y929 Unspecified place or not applicable: Secondary | ICD-10-CM | POA: Diagnosis not present

## 2016-05-17 DIAGNOSIS — Y999 Unspecified external cause status: Secondary | ICD-10-CM | POA: Diagnosis not present

## 2016-05-17 DIAGNOSIS — Z79899 Other long term (current) drug therapy: Secondary | ICD-10-CM | POA: Diagnosis not present

## 2016-05-17 DIAGNOSIS — Y9301 Activity, walking, marching and hiking: Secondary | ICD-10-CM | POA: Insufficient documentation

## 2016-05-17 DIAGNOSIS — IMO0002 Reserved for concepts with insufficient information to code with codable children: Secondary | ICD-10-CM

## 2016-05-17 LAB — CBC
HCT: 35.8 % (ref 35.0–47.0)
HEMOGLOBIN: 12.3 g/dL (ref 12.0–16.0)
MCH: 32.3 pg (ref 26.0–34.0)
MCHC: 34.4 g/dL (ref 32.0–36.0)
MCV: 93.8 fL (ref 80.0–100.0)
Platelets: 185 10*3/uL (ref 150–440)
RBC: 3.81 MIL/uL (ref 3.80–5.20)
RDW: 13.9 % (ref 11.5–14.5)
WBC: 13.1 10*3/uL — ABNORMAL HIGH (ref 3.6–11.0)

## 2016-05-17 LAB — COMPREHENSIVE METABOLIC PANEL
ALK PHOS: 75 U/L (ref 38–126)
ALT: 23 U/L (ref 14–54)
ANION GAP: 6 (ref 5–15)
AST: 21 U/L (ref 15–41)
Albumin: 3.7 g/dL (ref 3.5–5.0)
BILIRUBIN TOTAL: 0.8 mg/dL (ref 0.3–1.2)
BUN: 25 mg/dL — ABNORMAL HIGH (ref 6–20)
CALCIUM: 9.4 mg/dL (ref 8.9–10.3)
CO2: 29 mmol/L (ref 22–32)
CREATININE: 0.81 mg/dL (ref 0.44–1.00)
Chloride: 106 mmol/L (ref 101–111)
Glucose, Bld: 146 mg/dL — ABNORMAL HIGH (ref 65–99)
Potassium: 4.2 mmol/L (ref 3.5–5.1)
Sodium: 141 mmol/L (ref 135–145)
TOTAL PROTEIN: 7.1 g/dL (ref 6.5–8.1)

## 2016-05-17 MED ORDER — SULFAMETHOXAZOLE-TRIMETHOPRIM 400-80 MG PO TABS: 1.0000 | ORAL_TABLET | Freq: Two times a day (BID) | ORAL | Status: DC

## 2016-05-17 MED ORDER — BACITRACIN ZINC 500 UNIT/GM EX OINT
TOPICAL_OINTMENT | CUTANEOUS | Status: AC
  Filled 2016-05-17: qty 0.9

## 2016-05-17 MED ORDER — LIDOCAINE-EPINEPHRINE (PF) 1 %-1:200000 IJ SOLN
INTRAMUSCULAR | Status: AC
  Administered 2016-05-17: 10 mL via INTRADERMAL
  Filled 2016-05-17: qty 30

## 2016-05-17 MED ORDER — LIDOCAINE-EPINEPHRINE (PF) 1 %-1:200000 IJ SOLN
10.0000 mL | Freq: Once | INTRAMUSCULAR | Status: AC
  Administered 2016-05-17: 10 mL via INTRADERMAL

## 2016-05-17 MED ORDER — BACITRACIN ZINC 500 UNIT/GM EX OINT
TOPICAL_OINTMENT | Freq: Once | CUTANEOUS | Status: AC
  Administered 2016-05-17: 13:00:00 via TOPICAL

## 2016-05-17 NOTE — ED Notes (Signed)
Patient given meal tray. Patient has called her ride for transport home.

## 2016-05-17 NOTE — ED Notes (Signed)
Patient placed on 2L Carmen [name].

## 2016-05-17 NOTE — Discharge Instructions (Signed)
Please return tear primary care doctor in 10 days for suture removal. Return to the emergency department or see her primary care doctor sooner for any signs of infection such as fever, increased redness, increased pain or pus.

## 2016-05-17 NOTE — ED Notes (Signed)
Pt arrives via [facility] from home  Pt was walking up her steps this am when she fell resulting in her hitting her left leg on the bricks   Low BP present upon arrival

## 2016-05-17 NOTE — ED Notes (Signed)
Patient taken off O2. SpO2 98%. Tolerating RA well.

## 2016-05-17 NOTE — ED Provider Notes (Signed)
[facility] Emergency Department Provider Note  Time seen: 11:57 AM  I have reviewed the triage vital signs and the nursing notes.   HISTORY  Chief Complaint Fall    HPI Carmen [name] is a 60 y.o. female with a past medical history of obesity, arthritis, who presents to the emergency department after a fall. According to the patient she was attempting to walk up her brick stairs when she tripped falling onto her left leg. Suffered a laceration to left leg, called EMS. EMS help the patient stand up and walk to the stretcher. Patient denies any pain besides her left leg. Denies any hip pain back pain pain. Denies hitting her head or LOC.     Past Medical History  Diagnosis Date  . Morbid obesity   . Osteoarthritis of both knees   . Endometrial cancer     Grade 1    Patient Active Problem List   Diagnosis Date Noted  . Uterine carcinoma ([place]) 01/30/2012  . ABNORMAL GLANDULAR PAPANICOLAOU SMEAR OF CERVIX 10/10/2010  . SHORTNESS OF BREATH 09/30/2010  . OBESITY 09/02/2010  . KNEE PAIN, BILATERAL 09/02/2010    Past Surgical History  Procedure Laterality Date  . Robotic assisted lap vaginal hysterectomy  01/10/2011    BSO    Current Outpatient Rx  Name  Route  Sig  Dispense  Refill  . aspirin 81 MG tablet   Oral   Take 81 mg by mouth 2 (two) times daily.         . Calcium Carbonate (CALCIUM 500 PO)   Oral   Take by mouth.         . Cholecalciferol (VITAMIN D PO)   Oral   Take 800 mg by mouth.         . Cyanocobalamin (VITAMIN B-12 PO)   Oral   Take 800 mg by mouth.         . Multiple Vitamins-Minerals (MEGA MULTI WOMEN PO)   Oral   Take 1,200 mg by mouth daily.         . vitamin E 400 UNIT capsule   Oral   Take 400 Units by mouth 2 (two) times daily.           Allergies Clarithromycin and Penicillins  No family history on file.  Social History Social History  Substance Use Topics  . Smoking status: Never Smoker    . Smokeless tobacco: Not on file  . Alcohol Use: No    Review of Systems Constitutional: Negative for fever. Cardiovascular: Negative for chest pain. Respiratory: Negative for shortness of breath. Gastrointestinal: Negative for abdominal pain Musculoskeletal: Negative for back pain. Laceration to left leg. Neurological: Negative for headache 10-point ROS otherwise negative.  ____________________________________________   PHYSICAL EXAM:  VITAL SIGNS: ED Triage Vitals  Enc Vitals Group     BP 05/17/16 1115 103/52 mmHg     Pulse Rate 05/17/16 1115 67     Resp 05/17/16 1115 18     Temp 05/17/16 1115 98.3 F (36.8 C)     Temp Source 05/17/16 1115 Oral     SpO2 05/17/16 1115 98 %     Weight 05/17/16 1115 306 lb (138.801 kg)     Height 05/17/16 1115 5\' 1"  (1.549 m)     Head Cir --      Peak Flow --      Pain Score 05/17/16 1119 8     Pain Loc --  Pain Edu? --      Excl. in [place]? --     Constitutional: Alert and oriented. Well appearing and in no distress. Eyes: Normal exam ENT   Head: Normocephalic and atraumatic   Mouth/Throat: Mucous membranes are moist. Cardiovascular: Normal rate, regular rhythm. No murmur Respiratory: Normal respiratory effort without tachypnea nor retractions. Breath sounds are clear  Gastrointestinal: Soft and nontender. No distention.  Musculoskeletal: 8 cm horizontal laceration to left lower extremity. Appears clean. Neurologic:  Normal speech and language. No gross focal neurologic deficits  Skin:  Skin is warm, dry.Marland Kitchen Positive laceration to left lower external ear. Psychiatric: Mood and affect are normal.   ____________________________________________    INITIAL IMPRESSION / ASSESSMENT AND PLAN / ED COURSE  Pertinent labs & imaging results that were available during my care of the patient were reviewed by me and considered in my medical decision making (see chart for details).  Patient presents to the emergency department for  mechanical fall. Suffering an approximate 8 cm laceration to the left shin. Laceration has been repaired. Tetanus is up-to-date. Given the length of laceration we will cover with a short course of Keflex. I discussed returning to her primary care doctor in 10 days for suture removal. Patient agreeable to plan. Basic labs are within normal limits. We'll discharge home.  LACERATION REPAIR Performed by: Harvest Dark Authorized by: Harvest Dark Consent: Verbal consent obtained. Risks and be` nefits: risks, benefits and alternatives were discussed Consent given by: patient Patient identity confirmed: provided demographic data Prepped and Draped in normal sterile fashion Wound explored  Laceration Location: left lower extremity   Laceration Length: 8cm  No Foreign Bodies seen or palpated  Anesthesia: local infiltration  Local anesthetic: lidocaine 1% w/ epinephrine  Anesthetic total: 12 ml  Irrigation method: syringe Amount of cleaning: standard  Skin closure: 4-0 prolene   Number of sutures: 19 simple 3 horizontal mattress   Technique: simple interrupted, and horizontal mattress  Patient tolerance: Patient tolerated the procedure well with no immediate complications.   ____________________________________________   FINAL CLINICAL IMPRESSION(S) / ED DIAGNOSES  Fall Laceration   Harvest Dark, MD 05/17/16 1332

## 2016-06-13 DIAGNOSIS — R0902 Hypoxemia: Secondary | ICD-10-CM | POA: Diagnosis not present

## 2016-07-14 DIAGNOSIS — R0902 Hypoxemia: Secondary | ICD-10-CM | POA: Diagnosis not present

## 2016-08-14 DIAGNOSIS — R0902 Hypoxemia: Secondary | ICD-10-CM | POA: Diagnosis not present

## 2016-09-13 DIAGNOSIS — R0902 Hypoxemia: Secondary | ICD-10-CM | POA: Diagnosis not present

## 2016-10-14 DIAGNOSIS — R0902 Hypoxemia: Secondary | ICD-10-CM | POA: Diagnosis not present

## 2016-10-27 DIAGNOSIS — I10 Essential (primary) hypertension: Secondary | ICD-10-CM | POA: Diagnosis not present

## 2016-10-27 DIAGNOSIS — E663 Overweight: Secondary | ICD-10-CM | POA: Diagnosis not present

## 2016-10-27 DIAGNOSIS — I509 Heart failure, unspecified: Secondary | ICD-10-CM | POA: Diagnosis not present

## 2016-11-13 DIAGNOSIS — R0902 Hypoxemia: Secondary | ICD-10-CM | POA: Diagnosis not present

## 2016-12-14 DIAGNOSIS — R0902 Hypoxemia: Secondary | ICD-10-CM | POA: Diagnosis not present

## 2017-01-14 DIAGNOSIS — R0902 Hypoxemia: Secondary | ICD-10-CM | POA: Diagnosis not present

## 2017-02-11 DIAGNOSIS — R0902 Hypoxemia: Secondary | ICD-10-CM | POA: Diagnosis not present

## 2017-03-02 DIAGNOSIS — E784 Other hyperlipidemia: Secondary | ICD-10-CM | POA: Diagnosis not present

## 2017-03-02 DIAGNOSIS — R5381 Other malaise: Secondary | ICD-10-CM | POA: Diagnosis not present

## 2017-03-02 DIAGNOSIS — I10 Essential (primary) hypertension: Secondary | ICD-10-CM | POA: Diagnosis not present

## 2017-03-02 DIAGNOSIS — I509 Heart failure, unspecified: Secondary | ICD-10-CM | POA: Diagnosis not present

## 2017-03-14 DIAGNOSIS — R0902 Hypoxemia: Secondary | ICD-10-CM | POA: Diagnosis not present

## 2017-03-16 DIAGNOSIS — Z880 Allergy status to penicillin: Secondary | ICD-10-CM | POA: Diagnosis not present

## 2017-03-16 DIAGNOSIS — I509 Heart failure, unspecified: Secondary | ICD-10-CM | POA: Diagnosis not present

## 2017-03-16 DIAGNOSIS — I119 Hypertensive heart disease without heart failure: Secondary | ICD-10-CM | POA: Diagnosis not present

## 2017-04-13 DIAGNOSIS — R0902 Hypoxemia: Secondary | ICD-10-CM | POA: Diagnosis not present

## 2017-05-14 DIAGNOSIS — R0902 Hypoxemia: Secondary | ICD-10-CM | POA: Diagnosis not present

## 2017-05-18 DIAGNOSIS — I509 Heart failure, unspecified: Secondary | ICD-10-CM | POA: Diagnosis not present

## 2017-05-18 DIAGNOSIS — I119 Hypertensive heart disease without heart failure: Secondary | ICD-10-CM | POA: Diagnosis not present

## 2017-06-13 DIAGNOSIS — R0902 Hypoxemia: Secondary | ICD-10-CM | POA: Diagnosis not present

## 2017-07-14 DIAGNOSIS — R0902 Hypoxemia: Secondary | ICD-10-CM | POA: Diagnosis not present

## 2017-08-14 DIAGNOSIS — R0902 Hypoxemia: Secondary | ICD-10-CM | POA: Diagnosis not present

## 2017-09-13 DIAGNOSIS — R0902 Hypoxemia: Secondary | ICD-10-CM | POA: Diagnosis not present

## 2017-09-21 DIAGNOSIS — I119 Hypertensive heart disease without heart failure: Secondary | ICD-10-CM | POA: Diagnosis not present

## 2017-09-21 DIAGNOSIS — I509 Heart failure, unspecified: Secondary | ICD-10-CM | POA: Diagnosis not present

## 2017-09-21 DIAGNOSIS — Z23 Encounter for immunization: Secondary | ICD-10-CM | POA: Diagnosis not present

## 2017-09-21 DIAGNOSIS — Z880 Allergy status to penicillin: Secondary | ICD-10-CM | POA: Diagnosis not present

## 2017-10-14 DIAGNOSIS — R0902 Hypoxemia: Secondary | ICD-10-CM | POA: Diagnosis not present

## 2017-11-13 DIAGNOSIS — R0902 Hypoxemia: Secondary | ICD-10-CM | POA: Diagnosis not present

## 2017-12-14 DIAGNOSIS — R0902 Hypoxemia: Secondary | ICD-10-CM | POA: Diagnosis not present

## 2018-01-14 DIAGNOSIS — R0902 Hypoxemia: Secondary | ICD-10-CM | POA: Diagnosis not present

## 2018-02-11 DIAGNOSIS — R0902 Hypoxemia: Secondary | ICD-10-CM | POA: Diagnosis not present

## 2018-03-14 DIAGNOSIS — R0902 Hypoxemia: Secondary | ICD-10-CM | POA: Diagnosis not present

## 2018-04-13 DIAGNOSIS — R0902 Hypoxemia: Secondary | ICD-10-CM | POA: Diagnosis not present

## 2018-04-26 DIAGNOSIS — I509 Heart failure, unspecified: Secondary | ICD-10-CM | POA: Diagnosis not present

## 2018-04-26 DIAGNOSIS — Z880 Allergy status to penicillin: Secondary | ICD-10-CM | POA: Diagnosis not present

## 2018-04-26 DIAGNOSIS — I10 Essential (primary) hypertension: Secondary | ICD-10-CM | POA: Diagnosis not present

## 2018-05-14 DIAGNOSIS — R0902 Hypoxemia: Secondary | ICD-10-CM | POA: Diagnosis not present

## 2018-06-07 DIAGNOSIS — Z880 Allergy status to penicillin: Secondary | ICD-10-CM | POA: Diagnosis not present

## 2018-06-07 DIAGNOSIS — I509 Heart failure, unspecified: Secondary | ICD-10-CM | POA: Diagnosis not present

## 2018-06-13 DIAGNOSIS — R0902 Hypoxemia: Secondary | ICD-10-CM | POA: Diagnosis not present

## 2018-07-14 DIAGNOSIS — R0902 Hypoxemia: Secondary | ICD-10-CM | POA: Diagnosis not present

## 2018-08-14 DIAGNOSIS — R0902 Hypoxemia: Secondary | ICD-10-CM | POA: Diagnosis not present

## 2018-09-13 DIAGNOSIS — R0902 Hypoxemia: Secondary | ICD-10-CM | POA: Diagnosis not present

## 2018-10-04 DIAGNOSIS — I10 Essential (primary) hypertension: Secondary | ICD-10-CM | POA: Diagnosis not present

## 2018-10-04 DIAGNOSIS — R5381 Other malaise: Secondary | ICD-10-CM | POA: Diagnosis not present

## 2018-10-04 DIAGNOSIS — I509 Heart failure, unspecified: Secondary | ICD-10-CM | POA: Diagnosis not present

## 2018-10-04 DIAGNOSIS — E7849 Other hyperlipidemia: Secondary | ICD-10-CM | POA: Diagnosis not present

## 2018-10-04 DIAGNOSIS — Z880 Allergy status to penicillin: Secondary | ICD-10-CM | POA: Diagnosis not present

## 2018-10-14 DIAGNOSIS — R0902 Hypoxemia: Secondary | ICD-10-CM | POA: Diagnosis not present

## 2018-11-13 DIAGNOSIS — R0902 Hypoxemia: Secondary | ICD-10-CM | POA: Diagnosis not present

## 2018-12-14 DIAGNOSIS — R0902 Hypoxemia: Secondary | ICD-10-CM | POA: Diagnosis not present

## 2019-01-14 DIAGNOSIS — R0902 Hypoxemia: Secondary | ICD-10-CM | POA: Diagnosis not present

## 2019-01-31 DIAGNOSIS — Z880 Allergy status to penicillin: Secondary | ICD-10-CM | POA: Diagnosis not present

## 2019-01-31 DIAGNOSIS — I509 Heart failure, unspecified: Secondary | ICD-10-CM | POA: Diagnosis not present

## 2019-02-12 DIAGNOSIS — R0902 Hypoxemia: Secondary | ICD-10-CM | POA: Diagnosis not present

## 2019-03-15 DIAGNOSIS — R0902 Hypoxemia: Secondary | ICD-10-CM | POA: Diagnosis not present

## 2019-09-14 DIAGNOSIS — R0902 Hypoxemia: Secondary | ICD-10-CM | POA: Diagnosis not present

## 2019-10-20 ENCOUNTER — Other Ambulatory Visit (HOSPITAL_COMMUNITY): Payer: Self-pay | Admitting: Internal Medicine

## 2019-10-20 DIAGNOSIS — Z0000 Encounter for general adult medical examination without abnormal findings: Secondary | ICD-10-CM | POA: Diagnosis not present

## 2019-10-20 DIAGNOSIS — I509 Heart failure, unspecified: Secondary | ICD-10-CM | POA: Diagnosis not present

## 2019-10-20 DIAGNOSIS — I119 Hypertensive heart disease without heart failure: Secondary | ICD-10-CM | POA: Diagnosis not present

## 2019-10-21 DIAGNOSIS — R5381 Other malaise: Secondary | ICD-10-CM | POA: Diagnosis not present

## 2019-10-21 DIAGNOSIS — I10 Essential (primary) hypertension: Secondary | ICD-10-CM | POA: Diagnosis not present

## 2019-10-21 DIAGNOSIS — E7849 Other hyperlipidemia: Secondary | ICD-10-CM | POA: Diagnosis not present

## 2019-10-31 DIAGNOSIS — Z9981 Dependence on supplemental oxygen: Secondary | ICD-10-CM | POA: Diagnosis not present

## 2019-10-31 DIAGNOSIS — I110 Hypertensive heart disease with heart failure: Secondary | ICD-10-CM | POA: Diagnosis not present

## 2019-10-31 DIAGNOSIS — I890 Lymphedema, not elsewhere classified: Secondary | ICD-10-CM | POA: Diagnosis not present

## 2019-10-31 DIAGNOSIS — I5040 Unspecified combined systolic (congestive) and diastolic (congestive) heart failure: Secondary | ICD-10-CM | POA: Diagnosis not present

## 2019-11-07 DIAGNOSIS — Z9981 Dependence on supplemental oxygen: Secondary | ICD-10-CM | POA: Diagnosis not present

## 2019-11-07 DIAGNOSIS — I890 Lymphedema, not elsewhere classified: Secondary | ICD-10-CM | POA: Diagnosis not present

## 2019-11-07 DIAGNOSIS — I5040 Unspecified combined systolic (congestive) and diastolic (congestive) heart failure: Secondary | ICD-10-CM | POA: Diagnosis not present

## 2019-11-07 DIAGNOSIS — I110 Hypertensive heart disease with heart failure: Secondary | ICD-10-CM | POA: Diagnosis not present

## 2019-11-08 DIAGNOSIS — I5040 Unspecified combined systolic (congestive) and diastolic (congestive) heart failure: Secondary | ICD-10-CM | POA: Diagnosis not present

## 2019-11-08 DIAGNOSIS — I110 Hypertensive heart disease with heart failure: Secondary | ICD-10-CM | POA: Diagnosis not present

## 2019-11-08 DIAGNOSIS — I890 Lymphedema, not elsewhere classified: Secondary | ICD-10-CM | POA: Diagnosis not present

## 2019-11-08 DIAGNOSIS — Z9981 Dependence on supplemental oxygen: Secondary | ICD-10-CM | POA: Diagnosis not present

## 2019-11-11 DIAGNOSIS — I890 Lymphedema, not elsewhere classified: Secondary | ICD-10-CM | POA: Diagnosis not present

## 2019-11-11 DIAGNOSIS — Z9981 Dependence on supplemental oxygen: Secondary | ICD-10-CM | POA: Diagnosis not present

## 2019-11-11 DIAGNOSIS — I5040 Unspecified combined systolic (congestive) and diastolic (congestive) heart failure: Secondary | ICD-10-CM | POA: Diagnosis not present

## 2019-11-11 DIAGNOSIS — I110 Hypertensive heart disease with heart failure: Secondary | ICD-10-CM | POA: Diagnosis not present

## 2019-11-14 DIAGNOSIS — I890 Lymphedema, not elsewhere classified: Secondary | ICD-10-CM | POA: Diagnosis not present

## 2019-11-14 DIAGNOSIS — Z9981 Dependence on supplemental oxygen: Secondary | ICD-10-CM | POA: Diagnosis not present

## 2019-11-14 DIAGNOSIS — R0902 Hypoxemia: Secondary | ICD-10-CM | POA: Diagnosis not present

## 2019-11-14 DIAGNOSIS — I5040 Unspecified combined systolic (congestive) and diastolic (congestive) heart failure: Secondary | ICD-10-CM | POA: Diagnosis not present

## 2019-11-14 DIAGNOSIS — I110 Hypertensive heart disease with heart failure: Secondary | ICD-10-CM | POA: Diagnosis not present

## 2019-11-18 DIAGNOSIS — I110 Hypertensive heart disease with heart failure: Secondary | ICD-10-CM | POA: Diagnosis not present

## 2019-11-18 DIAGNOSIS — I5040 Unspecified combined systolic (congestive) and diastolic (congestive) heart failure: Secondary | ICD-10-CM | POA: Diagnosis not present

## 2019-11-18 DIAGNOSIS — Z9981 Dependence on supplemental oxygen: Secondary | ICD-10-CM | POA: Diagnosis not present

## 2019-11-18 DIAGNOSIS — I890 Lymphedema, not elsewhere classified: Secondary | ICD-10-CM | POA: Diagnosis not present

## 2019-11-28 DIAGNOSIS — Z9981 Dependence on supplemental oxygen: Secondary | ICD-10-CM | POA: Diagnosis not present

## 2019-11-28 DIAGNOSIS — I890 Lymphedema, not elsewhere classified: Secondary | ICD-10-CM | POA: Diagnosis not present

## 2019-11-28 DIAGNOSIS — I5040 Unspecified combined systolic (congestive) and diastolic (congestive) heart failure: Secondary | ICD-10-CM | POA: Diagnosis not present

## 2019-11-28 DIAGNOSIS — I110 Hypertensive heart disease with heart failure: Secondary | ICD-10-CM | POA: Diagnosis not present

## 2019-12-02 DIAGNOSIS — I110 Hypertensive heart disease with heart failure: Secondary | ICD-10-CM | POA: Diagnosis not present

## 2019-12-02 DIAGNOSIS — Z9981 Dependence on supplemental oxygen: Secondary | ICD-10-CM | POA: Diagnosis not present

## 2019-12-02 DIAGNOSIS — I890 Lymphedema, not elsewhere classified: Secondary | ICD-10-CM | POA: Diagnosis not present

## 2019-12-02 DIAGNOSIS — I5040 Unspecified combined systolic (congestive) and diastolic (congestive) heart failure: Secondary | ICD-10-CM | POA: Diagnosis not present

## 2019-12-12 DIAGNOSIS — I5040 Unspecified combined systolic (congestive) and diastolic (congestive) heart failure: Secondary | ICD-10-CM | POA: Diagnosis not present

## 2019-12-12 DIAGNOSIS — I110 Hypertensive heart disease with heart failure: Secondary | ICD-10-CM | POA: Diagnosis not present

## 2019-12-12 DIAGNOSIS — Z9981 Dependence on supplemental oxygen: Secondary | ICD-10-CM | POA: Diagnosis not present

## 2019-12-12 DIAGNOSIS — I890 Lymphedema, not elsewhere classified: Secondary | ICD-10-CM | POA: Diagnosis not present

## 2019-12-26 DIAGNOSIS — I110 Hypertensive heart disease with heart failure: Secondary | ICD-10-CM | POA: Diagnosis not present

## 2019-12-26 DIAGNOSIS — Z9981 Dependence on supplemental oxygen: Secondary | ICD-10-CM | POA: Diagnosis not present

## 2019-12-26 DIAGNOSIS — I890 Lymphedema, not elsewhere classified: Secondary | ICD-10-CM | POA: Diagnosis not present

## 2019-12-26 DIAGNOSIS — I5040 Unspecified combined systolic (congestive) and diastolic (congestive) heart failure: Secondary | ICD-10-CM | POA: Diagnosis not present

## 2020-04-18 ENCOUNTER — Other Ambulatory Visit: Payer: Self-pay | Admitting: Internal Medicine

## 2020-07-04 ENCOUNTER — Other Ambulatory Visit: Payer: Self-pay | Admitting: Internal Medicine

## 2020-09-26 ENCOUNTER — Emergency Department: Payer: Medicare HMO

## 2020-09-26 ENCOUNTER — Inpatient Hospital Stay
Admission: EM | Admit: 2020-09-26 | Discharge: 2020-10-08 | DRG: 291 | Disposition: A | Discharge status: Other Acute Inpatient Hospital | Payer: Medicare HMO | Attending: Internal Medicine | Admitting: Internal Medicine

## 2020-09-26 ENCOUNTER — Other Ambulatory Visit: Payer: Self-pay

## 2020-09-26 DIAGNOSIS — R911 Solitary pulmonary nodule: Secondary | ICD-10-CM | POA: Diagnosis not present

## 2020-09-26 DIAGNOSIS — M170 Bilateral primary osteoarthritis of knee: Secondary | ICD-10-CM | POA: Diagnosis present

## 2020-09-26 DIAGNOSIS — Z20822 Contact with and (suspected) exposure to covid-19: Secondary | ICD-10-CM | POA: Diagnosis not present

## 2020-09-26 DIAGNOSIS — I5033 Acute on chronic diastolic (congestive) heart failure: Secondary | ICD-10-CM

## 2020-09-26 DIAGNOSIS — Z6841 Body Mass Index (BMI) 40.0 and over, adult: Secondary | ICD-10-CM

## 2020-09-26 DIAGNOSIS — I5032 Chronic diastolic (congestive) heart failure: Secondary | ICD-10-CM | POA: Diagnosis not present

## 2020-09-26 DIAGNOSIS — Z9981 Dependence on supplemental oxygen: Secondary | ICD-10-CM

## 2020-09-26 DIAGNOSIS — R7989 Other specified abnormal findings of blood chemistry: Secondary | ICD-10-CM | POA: Diagnosis not present

## 2020-09-26 DIAGNOSIS — I358 Other nonrheumatic aortic valve disorders: Secondary | ICD-10-CM | POA: Diagnosis present

## 2020-09-26 DIAGNOSIS — R778 Other specified abnormalities of plasma proteins: Secondary | ICD-10-CM | POA: Diagnosis not present

## 2020-09-26 DIAGNOSIS — I110 Hypertensive heart disease with heart failure: Secondary | ICD-10-CM | POA: Diagnosis not present

## 2020-09-26 DIAGNOSIS — Z8542 Personal history of malignant neoplasm of other parts of uterus: Secondary | ICD-10-CM | POA: Diagnosis not present

## 2020-09-26 DIAGNOSIS — I5031 Acute diastolic (congestive) heart failure: Secondary | ICD-10-CM | POA: Diagnosis not present

## 2020-09-26 DIAGNOSIS — Z880 Allergy status to penicillin: Secondary | ICD-10-CM

## 2020-09-26 DIAGNOSIS — J9621 Acute and chronic respiratory failure with hypoxia: Secondary | ICD-10-CM | POA: Diagnosis present

## 2020-09-26 DIAGNOSIS — I509 Heart failure, unspecified: Secondary | ICD-10-CM | POA: Diagnosis not present

## 2020-09-26 DIAGNOSIS — Z79899 Other long term (current) drug therapy: Secondary | ICD-10-CM | POA: Diagnosis not present

## 2020-09-26 DIAGNOSIS — R52 Pain, unspecified: Secondary | ICD-10-CM | POA: Diagnosis not present

## 2020-09-26 DIAGNOSIS — I248 Other forms of acute ischemic heart disease: Secondary | ICD-10-CM | POA: Diagnosis not present

## 2020-09-26 DIAGNOSIS — R0602 Shortness of breath: Secondary | ICD-10-CM | POA: Diagnosis not present

## 2020-09-26 DIAGNOSIS — Z881 Allergy status to other antibiotic agents status: Secondary | ICD-10-CM

## 2020-09-26 DIAGNOSIS — I890 Lymphedema, not elsewhere classified: Secondary | ICD-10-CM | POA: Diagnosis not present

## 2020-09-26 DIAGNOSIS — R42 Dizziness and giddiness: Secondary | ICD-10-CM | POA: Diagnosis not present

## 2020-09-26 DIAGNOSIS — R069 Unspecified abnormalities of breathing: Secondary | ICD-10-CM | POA: Diagnosis not present

## 2020-09-26 DIAGNOSIS — Z7982 Long term (current) use of aspirin: Secondary | ICD-10-CM | POA: Diagnosis not present

## 2020-09-26 DIAGNOSIS — I517 Cardiomegaly: Secondary | ICD-10-CM | POA: Diagnosis not present

## 2020-09-26 DIAGNOSIS — Z90710 Acquired absence of both cervix and uterus: Secondary | ICD-10-CM

## 2020-09-26 DIAGNOSIS — E876 Hypokalemia: Secondary | ICD-10-CM | POA: Diagnosis not present

## 2020-09-26 DIAGNOSIS — J811 Chronic pulmonary edema: Secondary | ICD-10-CM | POA: Diagnosis not present

## 2020-09-26 DIAGNOSIS — R0902 Hypoxemia: Secondary | ICD-10-CM | POA: Diagnosis not present

## 2020-09-26 DIAGNOSIS — J9611 Chronic respiratory failure with hypoxia: Secondary | ICD-10-CM | POA: Diagnosis not present

## 2020-09-26 DIAGNOSIS — I214 Non-ST elevation (NSTEMI) myocardial infarction: Secondary | ICD-10-CM

## 2020-09-26 DIAGNOSIS — R9431 Abnormal electrocardiogram [ECG] [EKG]: Secondary | ICD-10-CM | POA: Diagnosis not present

## 2020-09-26 DIAGNOSIS — J9622 Acute and chronic respiratory failure with hypercapnia: Secondary | ICD-10-CM

## 2020-09-26 DIAGNOSIS — R0600 Dyspnea, unspecified: Secondary | ICD-10-CM

## 2020-09-26 DIAGNOSIS — W19XXXA Unspecified fall, initial encounter: Secondary | ICD-10-CM | POA: Diagnosis not present

## 2020-09-26 DIAGNOSIS — C541 Malignant neoplasm of endometrium: Secondary | ICD-10-CM | POA: Diagnosis not present

## 2020-09-26 HISTORY — DX: Heart failure, unspecified: I50.9

## 2020-09-26 LAB — CBC WITH DIFFERENTIAL/PLATELET
Abs Immature Granulocytes: 0.06 10*3/uL (ref 0.00–0.07)
Basophils Absolute: 0.1 10*3/uL (ref 0.0–0.1)
Basophils Relative: 1 %
Eosinophils Absolute: 0.2 10*3/uL (ref 0.0–0.5)
Eosinophils Relative: 2 %
HCT: 39.4 % (ref 36.0–46.0)
Hemoglobin: 12.8 g/dL (ref 12.0–15.0)
Immature Granulocytes: 1 %
Lymphocytes Relative: 8 %
Lymphs Abs: 0.8 10*3/uL (ref 0.7–4.0)
MCH: 31.1 pg (ref 26.0–34.0)
MCHC: 32.5 g/dL (ref 30.0–36.0)
MCV: 95.9 fL (ref 80.0–100.0)
Monocytes Absolute: 0.4 10*3/uL (ref 0.1–1.0)
Monocytes Relative: 4 %
Neutro Abs: 8.4 10*3/uL — ABNORMAL HIGH (ref 1.7–7.7)
Neutrophils Relative %: 84 %
Platelets: 166 10*3/uL (ref 150–400)
RBC: 4.11 MIL/uL (ref 3.87–5.11)
RDW: 14.9 % (ref 11.5–15.5)
WBC: 10 10*3/uL (ref 4.0–10.5)
nRBC: 0 % (ref 0.0–0.2)

## 2020-09-26 LAB — COMPREHENSIVE METABOLIC PANEL
ALT: 17 U/L (ref 0–44)
AST: 22 U/L (ref 15–41)
Albumin: 3.7 g/dL (ref 3.5–5.0)
Alkaline Phosphatase: 85 U/L (ref 38–126)
Anion gap: 11 (ref 5–15)
BUN: 28 mg/dL — ABNORMAL HIGH (ref 8–23)
CO2: 28 mmol/L (ref 22–32)
Calcium: 8.9 mg/dL (ref 8.9–10.3)
Chloride: 101 mmol/L (ref 98–111)
Creatinine, Ser: 0.74 mg/dL (ref 0.44–1.00)
GFR, Estimated: >60 mL/min (ref >60)
Glucose, Bld: 140 mg/dL — ABNORMAL HIGH (ref 70–99)
Potassium: 4.5 mmol/L (ref 3.5–5.1)
Sodium: 140 mmol/L (ref 135–145)
Total Bilirubin: 1.1 mg/dL (ref 0.3–1.2)
Total Protein: 8.2 g/dL — ABNORMAL HIGH (ref 6.5–8.1)

## 2020-09-26 LAB — BLOOD GAS, VENOUS
Acid-Base Excess: 10.2 mmol/L — ABNORMAL HIGH (ref 0.0–2.0)
Bicarbonate: 36.5 mmol/L — ABNORMAL HIGH (ref 20.0–28.0)
O2 Saturation: 93 %
Patient temperature: 37
pCO2, Ven: 55 mmHg (ref 44.0–60.0)
pH, Ven: 7.43 (ref 7.250–7.430)
pO2, Ven: 65 mmHg — ABNORMAL HIGH (ref 32.0–45.0)

## 2020-09-26 LAB — TROPONIN I (HIGH SENSITIVITY): Troponin I (High Sensitivity): 12 ng/L (ref <18)

## 2020-09-26 LAB — LACTIC ACID, PLASMA: Lactic Acid, Venous: 1.7 mmol/L (ref 0.5–1.9)

## 2020-09-26 LAB — FIBRIN DERIVATIVES D-DIMER (ARMC ONLY): Fibrin derivatives D-dimer (ARMC): 766.45 ng/mL (FEU) — ABNORMAL HIGH (ref 0.00–499.00)

## 2020-09-26 LAB — BRAIN NATRIURETIC PEPTIDE: B Natriuretic Peptide: 123.2 pg/mL — ABNORMAL HIGH (ref 0.0–100.0)

## 2020-09-26 NOTE — ED Triage Notes (Signed)
Pt brought in by EMS for shortness of breath. Patient chronically wears 2L nasal cannula oxygen and was found to have O2 saturation 88% on 2L. Nasal cannula increased to 3L and patient verbalized improvement with EMS. Pt states shortness of breath started several days ago and got increasingly worse today. States today she was walking to the bathroom and felt like she was going to "pass out."

## 2020-09-26 NOTE — ED Notes (Deleted)
Tom to [employer] to [facility]

## 2020-09-26 NOTE — ED Provider Notes (Signed)
[facility] Emergency Department Provider Note   ____________________________________________   First MD Initiated Contact with Patient 09/26/20 2144     (approximate)  I have reviewed the triage vital signs and the nursing notes.   HISTORY  Chief Complaint Shortness of Breath  Chief complaint is shortness of breath  HPI Carmen [name] is a 64 y.o. female who usually is on 2 L of oxygen nasal cannula.  She reports she got up to go to the bathroom today and became acutely short of breath.  When she came in here she was still very anxious and feeling short of breath but as time passed after transferring her from the EMS stretcher to the bed she became calmer had her O2 to sats have gone up she is now 97% on 2 L.  She is feeling better.          Past Medical History:  Diagnosis Date  . CHF (congestive heart failure) ([place])   . Endometrial cancer (HCC)    Grade 1  . Morbid obesity ([place])   . Osteoarthritis of both knees     Patient Active Problem List   Diagnosis Date Noted  . Uterine carcinoma ([place]) 01/30/2012  . ABNORMAL GLANDULAR PAPANICOLAOU SMEAR OF CERVIX 10/10/2010  . SHORTNESS OF BREATH 09/30/2010  . OBESITY 09/02/2010  . KNEE PAIN, BILATERAL 09/02/2010    Past Surgical History:  Procedure Laterality Date  . ROBOTIC ASSISTED LAP VAGINAL HYSTERECTOMY  01/10/2011   BSO    Prior to Admission medications   Medication Sig Start Date End Date Taking? Authorizing Provider  aspirin 81 MG tablet Take 81 mg by mouth 2 (two) times daily.    [provider]  Calcium Carbonate (CALCIUM 500 PO) Take by mouth.    [provider]  Cholecalciferol (VITAMIN D PO) Take 800 mg by mouth.    [provider]  Cyanocobalamin (VITAMIN B-12 PO) Take 800 mg by mouth.    [provider]  furosemide (LASIX) 40 MG tablet TAKE 2 TABLETS EVERY DAY 04/19/20   Cletis Athens, MD  Multiple Vitamins-Minerals (MEGA MULTI WOMEN PO) Take  1,200 mg by mouth daily.    [provider]  potassium chloride (KLOR-CON) 10 MEQ tablet TAKE 1 TABLET AT BEDTIME 07/05/20   Cletis Athens, MD  sulfamethoxazole-trimethoprim (BACTRIM) 400-80 MG tablet Take 1 tablet by mouth 2 (two) times daily. 05/17/16   Harvest Dark, MD  vitamin E 400 UNIT capsule Take 400 Units by mouth 2 (two) times daily.    [provider]    Allergies Clarithromycin and Penicillins  History reviewed. No pertinent family history.  Social History Social History   Tobacco Use  . Smoking status: Never Smoker  . Smokeless tobacco: Never Used  Substance Use Topics  . Alcohol use: No  . Drug use: No    Review of Systems  Constitutional: No fever/chills Eyes: No visual changes. ENT: No sore throat. Cardiovascular: Denies chest pain. Respiratory:  shortness of breath. Gastrointestinal: No abdominal pain.  No nausea, no vomiting.  No diarrhea.  No constipation. Genitourinary: Negative for dysuria. Musculoskeletal: Negative for back pain. Skin: Negative for rash. Neurological: Negative for headaches, focal weakness   ____________________________________________   PHYSICAL EXAM:  VITAL SIGNS: ED Triage Vitals  Enc Vitals Group     BP      Pulse      Resp      Temp      Temp src  SpO2      Weight      Height      Head Circumference      Peak Flow      Pain Score      Pain Loc      Pain Edu?      Excl. in [place]?    Constitutional: Alert and oriented.  Initially anxious and short of breath appearing later looks more calm and better Eyes: Conjunctivae are normal. PER. EOMI. Head: Atraumatic. Nose: No congestion/rhinnorhea. Mouth/Throat: Mucous membranes are moist.  Oropharynx non-erythematous. Neck: No stridor. Cardiovascular: Normal rate, regular rhythm. Grossly normal heart sounds.  Good peripheral circulation. Respiratory: Normal respiratory effort.  No retractions. Lungs CTAB. Gastrointestinal: Soft and nontender. No  distention. No abdominal bruits. No CVA tenderness. Musculoskeletal: No lower extremity tenderness tonic bilateral edema.   Neurologic:  Normal speech and language. No gross focal neurologic deficits are appreciated.  Skin:  Skin is warm, dry and intact.  Chronic changes in the legs   ____________________________________________   LABS (all labs ordered are listed, but only abnormal results are displayed)  Labs Reviewed  COMPREHENSIVE METABOLIC PANEL - Abnormal; Notable for the following components:      Result Value   Glucose, Bld 140 (*)    BUN 28 (*)    Total Protein 8.2 (*)    All other components within normal limits  BRAIN NATRIURETIC PEPTIDE - Abnormal; Notable for the following components:   B Natriuretic Peptide 123.2 (*)    All other components within normal limits  CBC WITH DIFFERENTIAL/PLATELET - Abnormal; Notable for the following components:   Neutro Abs 8.4 (*)    All other components within normal limits  BLOOD GAS, VENOUS - Abnormal; Notable for the following components:   pO2, Ven 65.0 (*)    Bicarbonate 36.5 (*)    Acid-Base Excess 10.2 (*)    All other components within normal limits  FIBRIN DERIVATIVES D-DIMER ([facility] ONLY) - Abnormal; Notable for the following components:   Fibrin derivatives D-dimer ([facility]) 766.45 (*)    All other components within normal limits  LACTIC ACID, PLASMA  LACTIC ACID, PLASMA  TROPONIN I (HIGH SENSITIVITY)  TROPONIN I (HIGH SENSITIVITY)   ____________________________________________  EKG  EKG read interpreted by me shows normal sinus rhythm rate of 75 normal axis there is a flipped T in lead III computer is reading ST elevation laterally but I do not see any.  No acute ST-T wave changes ____________________________________________  RADIOLOGY Gertha Calkin, personally viewed and evaluated these images (plain radiographs) as part of my medical decision making, as well as reviewing the written report by the  radiologist.  ED MD interpretation: Chest x-ray read by radiology reviewed by me looks like CHF  Official radiology report(s): DG Chest Portable 1 View  Result Date: 09/26/2020 CLINICAL DATA:  Shortness of breath. EXAM: PORTABLE CHEST 1 VIEW COMPARISON:  March 12, 2014 FINDINGS: There is no evidence of acute infiltrate, pleural effusion or pneumothorax. Mild to moderate severity prominence of the perihilar pulmonary vasculature is seen. The cardiac silhouette is mildly enlarged. The visualized skeletal structures are unremarkable. IMPRESSION: Cardiomegaly with mild to moderate severity pulmonary vascular congestion. Electronically Signed   By: Virgina Norfolk M.D.   On: 09/26/2020 22:10    ____________________________________________   PROCEDURES  Procedure(s) performed (including Critical Care):  Procedures   ____________________________________________   INITIAL IMPRESSION / ASSESSMENT AND PLAN / ED COURSE  Patient's D-dimer is elevated.  She is relatively  immobile at home so I will CT her to make sure she does not have a PE as well.  She additionally has a chest x-ray that looks to me to be CHF he and a BNP which is somewhat elevated at 123.  1 where the other we will have to admit her.  Pending the report of the CT scan I will sign her out to Dr. Owens Shark.              ____________________________________________   FINAL CLINICAL IMPRESSION(S) / ED DIAGNOSES  Final diagnoses:  Dyspnea, unspecified type  Congestive heart failure, unspecified HF chronicity, unspecified heart failure type [facility])     ED Discharge Orders    None      *Please note:  SHAYANA HORNSTEIN was evaluated in Emergency Department on 09/26/2020 for the symptoms described in the history of present illness. She was evaluated in the context of the global COVID-19 pandemic, which necessitated consideration that the patient might be at risk for infection with the SARS-CoV-2 virus that causes COVID-19.  Institutional protocols and algorithms that pertain to the evaluation of patients at risk for COVID-19 are in a state of rapid change based on information released by regulatory bodies including the CDC and federal and state organizations. These policies and algorithms were followed during the patient's care in the ED.  Some ED evaluations and interventions may be delayed as a result of limited staffing during and the pandemic.*   Note:  This document was prepared using Dragon voice recognition software and may include unintentional dictation errors.    Nena Polio, MD 09/26/20 [phone]

## 2020-09-27 ENCOUNTER — Encounter: Payer: Self-pay | Admitting: Radiology

## 2020-09-27 ENCOUNTER — Inpatient Hospital Stay (HOSPITAL_COMMUNITY)
Admit: 2020-09-27 | Discharge: 2020-09-27 | Disposition: A | Discharge status: Home / Self Care | Payer: Medicare HMO | Attending: Internal Medicine | Admitting: Internal Medicine

## 2020-09-27 ENCOUNTER — Emergency Department: Payer: Medicare HMO

## 2020-09-27 DIAGNOSIS — Z881 Allergy status to other antibiotic agents status: Secondary | ICD-10-CM | POA: Diagnosis not present

## 2020-09-27 DIAGNOSIS — J9621 Acute and chronic respiratory failure with hypoxia: Secondary | ICD-10-CM | POA: Diagnosis present

## 2020-09-27 DIAGNOSIS — E876 Hypokalemia: Secondary | ICD-10-CM | POA: Diagnosis not present

## 2020-09-27 DIAGNOSIS — I5032 Chronic diastolic (congestive) heart failure: Secondary | ICD-10-CM | POA: Diagnosis not present

## 2020-09-27 DIAGNOSIS — Z90710 Acquired absence of both cervix and uterus: Secondary | ICD-10-CM | POA: Diagnosis not present

## 2020-09-27 DIAGNOSIS — C541 Malignant neoplasm of endometrium: Secondary | ICD-10-CM | POA: Diagnosis not present

## 2020-09-27 DIAGNOSIS — I517 Cardiomegaly: Secondary | ICD-10-CM | POA: Diagnosis not present

## 2020-09-27 DIAGNOSIS — I509 Heart failure, unspecified: Secondary | ICD-10-CM

## 2020-09-27 DIAGNOSIS — I5031 Acute diastolic (congestive) heart failure: Secondary | ICD-10-CM

## 2020-09-27 DIAGNOSIS — R778 Other specified abnormalities of plasma proteins: Secondary | ICD-10-CM

## 2020-09-27 DIAGNOSIS — J811 Chronic pulmonary edema: Secondary | ICD-10-CM | POA: Diagnosis not present

## 2020-09-27 DIAGNOSIS — Z7982 Long term (current) use of aspirin: Secondary | ICD-10-CM | POA: Diagnosis not present

## 2020-09-27 DIAGNOSIS — R911 Solitary pulmonary nodule: Secondary | ICD-10-CM | POA: Diagnosis not present

## 2020-09-27 DIAGNOSIS — Z8542 Personal history of malignant neoplasm of other parts of uterus: Secondary | ICD-10-CM | POA: Diagnosis not present

## 2020-09-27 DIAGNOSIS — J9622 Acute and chronic respiratory failure with hypercapnia: Secondary | ICD-10-CM

## 2020-09-27 DIAGNOSIS — I5033 Acute on chronic diastolic (congestive) heart failure: Secondary | ICD-10-CM

## 2020-09-27 DIAGNOSIS — I110 Hypertensive heart disease with heart failure: Secondary | ICD-10-CM | POA: Diagnosis present

## 2020-09-27 DIAGNOSIS — R42 Dizziness and giddiness: Secondary | ICD-10-CM | POA: Diagnosis not present

## 2020-09-27 DIAGNOSIS — I890 Lymphedema, not elsewhere classified: Secondary | ICD-10-CM

## 2020-09-27 DIAGNOSIS — R0602 Shortness of breath: Secondary | ICD-10-CM | POA: Diagnosis not present

## 2020-09-27 DIAGNOSIS — I214 Non-ST elevation (NSTEMI) myocardial infarction: Secondary | ICD-10-CM

## 2020-09-27 DIAGNOSIS — Z20822 Contact with and (suspected) exposure to covid-19: Secondary | ICD-10-CM | POA: Diagnosis present

## 2020-09-27 DIAGNOSIS — I248 Other forms of acute ischemic heart disease: Secondary | ICD-10-CM | POA: Diagnosis present

## 2020-09-27 DIAGNOSIS — I358 Other nonrheumatic aortic valve disorders: Secondary | ICD-10-CM | POA: Diagnosis present

## 2020-09-27 DIAGNOSIS — Z79899 Other long term (current) drug therapy: Secondary | ICD-10-CM | POA: Diagnosis not present

## 2020-09-27 DIAGNOSIS — Z6841 Body Mass Index (BMI) 40.0 and over, adult: Secondary | ICD-10-CM

## 2020-09-27 DIAGNOSIS — M170 Bilateral primary osteoarthritis of knee: Secondary | ICD-10-CM | POA: Diagnosis present

## 2020-09-27 DIAGNOSIS — Z880 Allergy status to penicillin: Secondary | ICD-10-CM | POA: Diagnosis not present

## 2020-09-27 DIAGNOSIS — Z9981 Dependence on supplemental oxygen: Secondary | ICD-10-CM | POA: Diagnosis not present

## 2020-09-27 LAB — CBC
HCT: 36.6 % (ref 36.0–46.0)
Hemoglobin: 11.9 g/dL — ABNORMAL LOW (ref 12.0–15.0)
MCH: 30.7 pg (ref 26.0–34.0)
MCHC: 32.5 g/dL (ref 30.0–36.0)
MCV: 94.3 fL (ref 80.0–100.0)
Platelets: 170 K/uL (ref 150–400)
RBC: 3.88 MIL/uL (ref 3.87–5.11)
RDW: 15 % (ref 11.5–15.5)
WBC: 9.7 K/uL (ref 4.0–10.5)
nRBC: 0 % (ref 0.0–0.2)

## 2020-09-27 LAB — ECHOCARDIOGRAM COMPLETE
Height: 62 in
S' Lateral: 2.75 cm
Weight: 5527.37 oz

## 2020-09-27 LAB — RESPIRATORY PANEL BY RT PCR (FLU A&B, COVID)
Influenza A by PCR: NEGATIVE
Influenza B by PCR: NEGATIVE
SARS Coronavirus 2 by RT PCR: NEGATIVE

## 2020-09-27 LAB — TROPONIN I (HIGH SENSITIVITY): Troponin I (High Sensitivity): 40 ng/L — ABNORMAL HIGH (ref <18)

## 2020-09-27 LAB — CREATININE, SERUM
Creatinine, Ser: 0.61 mg/dL (ref 0.44–1.00)
GFR, Estimated: >60 mL/min (ref >60)

## 2020-09-27 LAB — LACTIC ACID, PLASMA: Lactic Acid, Venous: 0.8 mmol/L (ref 0.5–1.9)

## 2020-09-27 LAB — HIV ANTIBODY (ROUTINE TESTING W REFLEX): HIV Screen 4th Generation wRfx: NONREACTIVE

## 2020-09-27 MED ORDER — IOHEXOL 350 MG/ML SOLN
100.0000 mL | Freq: Once | INTRAVENOUS | Status: AC | PRN
  Administered 2020-09-27: 100 mL via INTRAVENOUS

## 2020-09-27 MED ORDER — FUROSEMIDE 10 MG/ML IJ SOLN
40.0000 mg | Freq: Once | INTRAMUSCULAR | Status: AC
  Administered 2020-09-27: 40 mg via INTRAVENOUS
  Filled 2020-09-27: qty 4

## 2020-09-27 MED ORDER — ENOXAPARIN SODIUM 80 MG/0.8ML ~~LOC~~ SOLN
80.0000 mg | SUBCUTANEOUS | Status: DC
  Administered 2020-09-27 – 2020-09-28 (×2): 80 mg via SUBCUTANEOUS
  Filled 2020-09-27 (×3): qty 0.8

## 2020-09-27 MED ORDER — LISINOPRIL 5 MG PO TABS
5.0000 mg | ORAL_TABLET | Freq: Every day | ORAL | Status: DC
  Administered 2020-09-27 – 2020-09-28 (×2): 5 mg via ORAL
  Filled 2020-09-27 (×2): qty 1

## 2020-09-27 MED ORDER — CARVEDILOL 3.125 MG PO TABS
3.1250 mg | ORAL_TABLET | Freq: Two times a day (BID) | ORAL | Status: DC
  Administered 2020-09-27 – 2020-09-29 (×5): 3.125 mg via ORAL
  Filled 2020-09-27 (×5): qty 1

## 2020-09-27 MED ORDER — ACETAMINOPHEN 325 MG PO TABS
650.0000 mg | ORAL_TABLET | ORAL | Status: DC | PRN
  Administered 2020-09-27 – 2020-10-05 (×7): 650 mg via ORAL
  Filled 2020-09-27 (×7): qty 2

## 2020-09-27 MED ORDER — FUROSEMIDE 10 MG/ML IJ SOLN
40.0000 mg | Freq: Two times a day (BID) | INTRAMUSCULAR | Status: DC
  Administered 2020-09-27 – 2020-09-29 (×6): 40 mg via INTRAVENOUS
  Filled 2020-09-27 (×6): qty 4

## 2020-09-27 MED ORDER — OXYCODONE HCL 5 MG PO TABS
5.0000 mg | ORAL_TABLET | Freq: Once | ORAL | Status: AC
  Administered 2020-09-27: 5 mg via ORAL
  Filled 2020-09-27: qty 1

## 2020-09-27 MED ORDER — SODIUM CHLORIDE 0.9 % IV SOLN: 250.0000 mL | INTRAVENOUS | Status: DC | PRN

## 2020-09-27 MED ORDER — SODIUM CHLORIDE 0.9% FLUSH
3.0000 mL | Freq: Two times a day (BID) | INTRAVENOUS | Status: DC
  Administered 2020-09-27 – 2020-10-08 (×24): 3 mL via INTRAVENOUS

## 2020-09-27 MED ORDER — SODIUM CHLORIDE 0.9% FLUSH: 3.0000 mL | INTRAVENOUS | Status: DC | PRN

## 2020-09-27 MED ORDER — ONDANSETRON HCL 4 MG/2ML IJ SOLN: 4.0000 mg | Freq: Four times a day (QID) | INTRAMUSCULAR | Status: DC | PRN

## 2020-09-27 NOTE — Progress Notes (Signed)
Same day rounding progress note  Patient seen and examined in the ED.  Waiting for floor bed.  She reports improvement in her symptoms of shortness of breath.  She does remain hypoxic requiring 4 L oxygen.  Her baseline is 2 L oxygen.  64 year old female with history of chronic diastolic heart failure, morbid obesity, chronic respiratory failure on home O2 2 L history of endometrial cancer presenting with several day history of dyspnea on exertion   Acute on chronic respiratory failure with hypoxia (HCC) -O2 sat 88% on home flow rate at 2 L requiring 4 L to maintain sats in the low to mid 90s.  -Secondary to CHF exacerbation -CTA chest negative for acute PE -No evidence of infiltrate on chest x-ray or CTA  Acute on chronic diastolic CHF (congestive heart failure) (HCC) -Several day history of dyspnea on exertion, lower extremity edema -chest x-ray showing cardiomegaly and pulmonary vascular congestion -IV Lasix, lisinopril. Holding beta-blocker for now -Echocardiogram was overall a difficult study due to body habitus but shows normal LV systolic function. -Cardiology input appreciated      Elevated troponin -Due to demand ischemia.  No chest pain/MI  Chronic lymphedema Probably one of the biggest issue.  She reports this going on since 2015 but her PCP gave her diagnosis last year She is not using Unna boots or following with any lymphedema clinic as of now -She may benefit from same at discharge    Morbid obesity with BMI of 60.0-69.9, adult ([place]) -Complicating factor to overall prognosis and care  We will consult PT, OT   Time spent: 15 minutes

## 2020-09-27 NOTE — Progress Notes (Signed)
Unable to obtain ReDs Vest reading d/t pts BMI being too high at 65.41.

## 2020-09-27 NOTE — Evaluation (Signed)
Physical Therapy Evaluation Patient Details Name: Carmen [name] MRN: [id number] DOB: Jan 12, 1956 Today's Date: 09/27/2020   History of Present Illness  Patient is a 64 year old female who presents to ED with increased SOB and dyspnea.  Imaging negative for PE. PMH of chronic diastolic HF, morbid oesity, chronic lymphedema, chronic respiratory failure on home O2 with 2L, endometrial cancer, chronic lymphedema.    Clinical Impression  Patient is a 64 year old female who presents with limited mobility and strength. Prior to hospital admission, pt was mod I with household mobility and ADLs with occasional help from husband on bad days and lives in a one story home with 3 steps to enter.  Currently pt refused to attempt any bed mobility or transfers and would only attempt LE movements in supine position. Diffuse weakness of LLE noted as patient is unable to fully raise it against gravity with high level of edema/lymphedema noted in bilateral legs.  Pt would benefit from skilled PT to address noted impairments and functional limitations. At this time upon discharge patient would benefit from SNF; however if she demonstrates improved ability to self mobilize and transfer while hospitalized her status can be re-assessed for home health. Patient will benefit from wheelchair as well as referral for lymphedema treatment.      Follow Up Recommendations SNF (Pending patient's progress while hospitalized, if able to self mobilize can re-assess for [facility])    Equipment Recommendations  Wheelchair (measurements PT)    Recommendations for Other Services       Precautions / Restrictions Precautions Precautions: Fall Restrictions Weight Bearing Restrictions: No Other Position/Activity Restrictions: bilateral LE edema/lymphadema      Mobility  Bed Mobility               General bed mobility comments: Patient refused to roll and/or sit EOB    Transfers                 General transfer  comment: Patient refused  Ambulation/Gait             General Gait Details: Patient refused  Stairs            Wheelchair Mobility    Modified Rankin (Stroke Patients Only)       Balance Overall balance assessment:  (unable to assess due to patient refusal.)                                           Pertinent Vitals/Pain Pain Assessment: 0-10 Pain Score: 9  Pain Location: back Pain Descriptors / Indicators: Aching Pain Intervention(s): Limited activity within patient's tolerance;Monitored during session;Repositioned    Home Living Family/patient expects to be discharged to:: Private residence Living Arrangements: Spouse/significant other Available Help at Discharge: Family Type of Home: House Home Access: Stairs to enter Entrance Stairs-Rails: Left Entrance Stairs-Number of Steps: 3 Home Layout: One level Home Equipment: Environmental consultant - 2 wheels;Cane - single point;Shower seat - built in;Grab bars - tub/shower;Grab bars - toilet      Prior Function Level of Independence: Needs assistance   Gait / Transfers Assistance Needed: able to walk short distances in home with walker and 2 L oxygen  ADL's / Homemaking Assistance Needed: able to dress and bath ind per patient report. Husband helps when she is having a bad day.  Comments: Patient reports she has good days and bad days. Uses a  walker and/or cane at home at baseline for short distances.     Hand Dominance        Extremity/Trunk Assessment   Upper Extremity Assessment Upper Extremity Assessment: Defer to OT evaluation    Lower Extremity Assessment Lower Extremity Assessment: Generalized weakness (limited assessment due to patient refusal to get EOB. LLE grossly 2-/5 RLE grossly 3/5)       Communication   Communication: No difficulties  Cognition Arousal/Alertness: Awake/alert Behavior During Therapy: WFL for tasks assessed/performed Overall Cognitive Status: Within Functional  Limits for tasks assessed                                 General Comments: Patient A and O x 4, very tearful      General Comments General comments (skin integrity, edema, etc.): Patient presents with bilateral excessive edema of LE's    Exercises Other Exercises Other Exercises: Patient educated on role of PT in acute care, need for mobility and need for change of position.   Assessment/Plan    PT Assessment Patient needs continued PT services  PT Problem List Decreased strength;Decreased range of motion;Decreased activity tolerance;Decreased balance;Decreased mobility;Cardiopulmonary status limiting activity;Pain;Obesity       PT Treatment Interventions DME instruction;Gait training;Stair training;Functional mobility training;Neuromuscular re-education;Balance training;Therapeutic exercise;Therapeutic activities;Patient/family education;Wheelchair mobility training;Manual techniques    PT Goals (Current goals can be found in the Care Plan section)  Acute Rehab PT Goals Patient Stated Goal: to be able to move PT Goal Formulation: With patient Time For Goal Achievement: 10/11/20 Potential to Achieve Goals: Fair    Frequency Min 2X/week   Barriers to discharge Decreased caregiver support;Inaccessible home environment Patient would benefit from wheelchair, SNF if unable to mobilize during hospitalization    Co-evaluation               AM-PAC PT "6 Clicks" Mobility  Outcome Measure Help needed turning from your back to your side while in a flat bed without using bedrails?: A Lot Help needed moving from lying on your back to sitting on the side of a flat bed without using bedrails?: A Lot Help needed moving to and from a bed to a chair (including a wheelchair)?: A Lot Help needed standing up from a chair using your arms (e.g., wheelchair or bedside chair)?: A Lot Help needed to walk in hospital room?: A Lot Help needed climbing 3-5 steps with a railing? :  Total 6 Click Score: 11    End of Session Equipment Utilized During Treatment: Oxygen (4L via nasal cannula) Activity Tolerance: Other (comment) (limited by patient refusal) Patient left: in bed;with call bell/phone within reach;with bed alarm set;with family/visitor present Nurse Communication: Mobility status PT Visit Diagnosis: Unsteadiness on feet (R26.81);Other abnormalities of gait and mobility (R26.89);Muscle weakness (generalized) (M62.81);Difficulty in walking, not elsewhere classified (R26.2);Pain Pain - part of body:  (back)    Time: 3244-0102 PT Time Calculation (min) (ACUTE ONLY): 24 min   Charges:   PT Evaluation $PT Eval Moderate Complexity: 1 Mod        Janna Arch, PT, DPT   09/27/2020, 5:27 PM

## 2020-09-27 NOTE — ED Notes (Signed)
Patient transported to CT 

## 2020-09-27 NOTE — ED Notes (Signed)
This RN, Asher Muir, Student RN, Caryl Pina, RN, [place], Rn at bedside. Pt with soiled linens. Pt changed. Clean gown, brief and chux placed. New purewick placed and suctioned turned on. Unable to obtain accurate output at this time. Pt denies any further needs at this time.

## 2020-09-27 NOTE — ED Notes (Addendum)
Pt O2 saturation noted to be 88% on 4L, O2 nasal cannula increased to 6L and O2 saturation increased to 93%, MD Owens Shark at bedside made aware.

## 2020-09-27 NOTE — ED Notes (Signed)
Pt requesting her allegra be added to medication list. MD notified.

## 2020-09-27 NOTE — Consult Note (Signed)
[facility] Consultation Note  Patient ID: Carmen [name], MRN: [id number], DOB/AGE: 04/19/1956 64 y.o. Admit date: 09/26/2020   Date of Consult: 09/27/2020 Primary Physician: Cletis Athens, MD Primary Cardiologist: [facility]  Chief Complaint:  Chief Complaint  Patient presents with  . Shortness of Breath   Reason for Consult: Heart failure  HPI: 64 y.o. female with apparent previous history of diastolic dysfunction heart failure admitted many years ago.  The patient has had severe lymphedema with lower extremity edema which has been treated with daily Lasix and occasional compression but the patient is very immobile.  In addition to that the patient has had worsening shortness of breath with and without physical activity increasing in frequency in the last several weeks culminating in needing to come to the hospital.  At that time the patient had a chest x-ray did show that the had pulmonary edema.  Additional BNP was 123 troponin 40 EKG showed and nonnormal sinus rhythm with nonspecific ST changes.  Echocardiogram showed normal LV systolic function with ejection fraction of 60% and no evidence of significant heart valve disease.  Patient overall has responded to intravenous Lasix with less shortness of breath.  There has been no changes in the lower extremities due to significant brawny changes and chronic issues.  The patient has used compression hose in the past but does not work very well.  Currently she is feeling a bit better  Past Medical History:  Diagnosis Date  . CHF (congestive heart failure) ([place])   . Endometrial cancer (HCC)    Grade 1  . Morbid obesity ([place])   . Osteoarthritis of both knees       Surgical History:  Past Surgical History:  Procedure Laterality Date  . ROBOTIC ASSISTED LAP VAGINAL HYSTERECTOMY  01/10/2011   BSO     Home Meds: Prior to Admission medications   Medication Sig Start Date End Date Taking? Authorizing Provider  amLODipine  (NORVASC) 10 MG tablet Take 10 mg by mouth daily. 08/31/20  Yes [provider]  carvedilol (COREG) 25 MG tablet Take 25 mg by mouth in the morning and at bedtime. 08/15/20  Yes [provider]  cloNIDine (CATAPRES) 0.1 MG tablet Take 0.1 mg by mouth daily. 08/04/20  Yes [provider]  enalapril (VASOTEC) 20 MG tablet Take 20 mg by mouth daily. 07/21/20  Yes [provider]  furosemide (LASIX) 40 MG tablet TAKE 2 TABLETS EVERY DAY 04/19/20  Yes Masoud, Viann Shove, MD  Multiple Vitamins-Minerals (MEGA MULTI WOMEN PO) Take 1,200 mg by mouth daily.   Yes [provider]  potassium chloride (KLOR-CON) 10 MEQ tablet TAKE 1 TABLET AT BEDTIME 07/05/20  Yes Masoud, Viann Shove, MD  vitamin E 400 UNIT capsule Take 400 Units by mouth 2 (two) times daily.   Yes [provider]    Inpatient Medications:  . enoxaparin (LOVENOX) injection  80 mg Subcutaneous Q24H  . furosemide  40 mg Intravenous BID  . lisinopril  5 mg Oral Daily  . sodium chloride flush  3 mL Intravenous Q12H   . sodium chloride      Allergies:  Allergies  Allergen Reactions  . Clarithromycin     REACTION: respiratory issues, can't breathe  . Penicillins     REACTION: can't breath    Social History   Socioeconomic History  . Marital status: Married    Spouse name: Not on file  . Number of children: Not on file  . Years of education: Not on  file  . Highest education level: Not on file  Occupational History  . Not on file  Tobacco Use  . Smoking status: Never Smoker  . Smokeless tobacco: Never Used  Substance and Sexual Activity  . Alcohol use: No  . Drug use: No  . Sexual activity: Never  Other Topics Concern  . Not on file  Social History Narrative  . Not on file   Social Determinants of Health   Financial Resource Strain:   . Difficulty of Paying Living Expenses: Not on file  Food Insecurity:   . Worried About Charity fundraiser in the Last Year: Not on file  . Ran Out  of Food in the Last Year: Not on file  Transportation Needs:   . Lack of Transportation (Medical): Not on file  . Lack of Transportation (Non-Medical): Not on file  Physical Activity:   . Days of Exercise per Week: Not on file  . Minutes of Exercise per Session: Not on file  Stress:   . Feeling of Stress : Not on file  Social Connections:   . Frequency of Communication with Friends and Family: Not on file  . Frequency of Social Gatherings with Friends and Family: Not on file  . Attends Religious Services: Not on file  . Active Member of Clubs or Organizations: Not on file  . Attends Archivist Meetings: Not on file  . Marital Status: Not on file  Intimate Partner Violence:   . Fear of Current or Ex-Partner: Not on file  . Emotionally Abused: Not on file  . Physically Abused: Not on file  . Sexually Abused: Not on file     History reviewed. No pertinent family history.   Review of Systems Positive for shortness of breath edema Negative for: General:  chills, fever, night sweats or weight changes.  Cardiovascular: PND orthopnea syncope dizziness  Dermatological skin lesions rashes Respiratory: Cough congestion Urologic: Frequent urination urination at night and hematuria Abdominal: negative for nausea, vomiting, diarrhea, bright red blood per rectum, melena, or hematemesis Neurologic: negative for visual changes, and/or hearing changes  All other systems reviewed and are otherwise negative except as noted above.  Labs: No results for input(s): CKTOTAL, CKMB, TROPONINI in the last 72 hours. Lab Results  Component Value Date   WBC 9.7 09/27/2020   HGB 11.9 (L) 09/27/2020   HCT 36.6 09/27/2020   MCV 94.3 09/27/2020   PLT 170 09/27/2020    Recent Labs  Lab 09/26/20 2213 09/26/20 2213 09/27/20 0350  NA 140  --   --   K 4.5  --   --   CL 101  --   --   CO2 28  --   --   BUN 28*  --   --   CREATININE 0.74   < > 0.61  CALCIUM 8.9  --   --   PROT 8.2*  --    --   BILITOT 1.1  --   --   ALKPHOS 85  --   --   ALT 17  --   --   AST 22  --   --   GLUCOSE 140*  --   --    < > = values in this interval not displayed.   Lab Results  Component Value Date   CHOL 125 03/11/2014   HDL 26 (L) 03/11/2014   LDLCALC 84 03/11/2014   TRIG 73 03/11/2014   No results found for: DDIMER  Radiology/Studies:  CT  Angio Chest PE W and/or Wo Contrast  Result Date: 09/27/2020 CLINICAL DATA:  Elevated D-dimer. EXAM: CT ANGIOGRAPHY CHEST WITH CONTRAST TECHNIQUE: Multidetector CT imaging of the chest was performed using the standard protocol during bolus administration of intravenous contrast. Multiplanar CT image reconstructions and MIPs were obtained to evaluate the vascular anatomy. CONTRAST:  157mL OMNIPAQUE IOHEXOL 350 MG/ML SOLN COMPARISON:  None. FINDINGS: Cardiovascular: There is moderate severity calcification of the aortic arch. Satisfactory opacification of the pulmonary arteries to the segmental level. No evidence of pulmonary embolism. Normal heart size. No pericardial effusion. Mediastinum/Nodes: There is mild right hilar and AP window lymphadenopathy. Thyroid gland, trachea, and esophagus demonstrate no significant findings. Lungs/Pleura: A 6 mm noncalcified lung nodule versus focal scar seen within the posterior aspect of the left apex. An 8 mm noncalcified lung nodule is seen within the anterolateral aspect of the right upper lobe. A 6 mm posterior right upper lobe noncalcified lung nodule is seen. Adjacent 6 mm noncalcified lung nodules are seen along the posterolateral aspect of the right lower lobe. There is no evidence of acute infiltrate, pleural effusion or pneumothorax. Upper Abdomen: No acute abnormality. Musculoskeletal: No chest wall abnormality. No acute or significant osseous findings. Review of the MIP images confirms the above findings. IMPRESSION: 1. No evidence of pulmonary embolism. 2. Multiple bilateral noncalcified lung nodules. Correlation  with follow-up chest CT is recommended in 3-6 months. 3. Aortic atherosclerosis. Aortic Atherosclerosis (ICD10-I70.0). Electronically Signed   By: Virgina Norfolk M.D.   On: 09/27/2020 01:28   DG Chest Portable 1 View  Result Date: 09/26/2020 CLINICAL DATA:  Shortness of breath. EXAM: PORTABLE CHEST 1 VIEW COMPARISON:  March 12, 2014 FINDINGS: There is no evidence of acute infiltrate, pleural effusion or pneumothorax. Mild to moderate severity prominence of the perihilar pulmonary vasculature is seen. The cardiac silhouette is mildly enlarged. The visualized skeletal structures are unremarkable. IMPRESSION: Cardiomegaly with mild to moderate severity pulmonary vascular congestion. Electronically Signed   By: Virgina Norfolk M.D.   On: 09/26/2020 22:10   ECHOCARDIOGRAM COMPLETE  Result Date: 09/27/2020    ECHOCARDIOGRAM REPORT   Patient Name:   Carmen [name] Date of Exam: 09/27/2020 Medical Rec #:  [id number]       Height:       62.0 in Accession #:    [id number]      Weight:       345.5 lb Date of Birth:  15-Jul-1956       BSA:          2.412 m Patient Age:    42 years        BP:           118/60 mmHg Patient Gender: F               HR:           64 bpm. Exam Location:  [facility] Procedure: 2D Echo, Cardiac Doppler and Color Doppler Indications:     CHF- acute diastolic 948.54  History:         Patient has no prior history of Echocardiogram examinations.                  CHF.  Sonographer:     Sherrie Sport RDCS (AE) Referring Phys:  [id number] Athena Masse Diagnosing Phys: Kathlyn Sacramento MD  Sonographer Comments: No apical window, no subcostal window and Technically challenging study due to limited acoustic windows. IMPRESSIONS  1. Left ventricular ejection fraction, by estimation,  is 55 to 60%. The left ventricle has normal function. Left ventricular endocardial border not optimally defined to evaluate regional wall motion. Left ventricular diastolic function could not be evaluated.  2. Right ventricular  systolic function is normal. The right ventricular size is normal.  3. Left atrial size was moderately dilated.  4. The mitral valve is normal in structure. No evidence of mitral valve regurgitation. No evidence of mitral stenosis.  5. The aortic valve is normal in structure. Aortic valve regurgitation is not visualized. Mild to moderate aortic valve sclerosis/calcification is present, without any evidence of aortic stenosis.  6. Very challenging image quality with limited views. FINDINGS  Left Ventricle: Left ventricular ejection fraction, by estimation, is 55 to 60%. The left ventricle has normal function. Left ventricular endocardial border not optimally defined to evaluate regional wall motion. The left ventricular internal cavity size was normal in size. There is no left ventricular hypertrophy. Left ventricular diastolic function could not be evaluated. Right Ventricle: The right ventricular size is normal. No increase in right ventricular wall thickness. Right ventricular systolic function is normal. Left Atrium: Left atrial size was moderately dilated. Right Atrium: Right atrial size was normal in size. Pericardium: There is no evidence of pericardial effusion. Mitral Valve: The mitral valve is normal in structure. No evidence of mitral valve regurgitation. No evidence of mitral valve stenosis. Tricuspid Valve: The tricuspid valve is normal in structure. Tricuspid valve regurgitation is trivial. No evidence of tricuspid stenosis. Aortic Valve: The aortic valve is normal in structure. Aortic valve regurgitation is not visualized. Mild to moderate aortic valve sclerosis/calcification is present, without any evidence of aortic stenosis. Pulmonic Valve: The pulmonic valve was normal in structure. Pulmonic valve regurgitation is not visualized. No evidence of pulmonic stenosis. Aorta: The aortic root is normal in size and structure. Venous: The inferior vena cava was not well visualized. IAS/Shunts: No atrial  level shunt detected by color flow Doppler.  LEFT VENTRICLE PLAX 2D LVIDd:         4.22 cm LVIDs:         2.75 cm LV PW:         1.14 cm LV IVS:        0.99 cm LVOT diam:     2.00 cm LVOT Area:     3.14 cm  LEFT ATRIUM         Index LA diam:    4.50 cm 1.87 cm/m                        PULMONIC VALVE AORTA                 PV Vmax:        0.92 m/s Ao Root diam: 2.40 cm PV Peak grad:   3.4 mmHg                       RVOT Peak grad: 16 mmHg   SHUNTS Systemic Diam: 2.00 cm Kathlyn Sacramento MD Electronically signed by Kathlyn Sacramento MD Signature Date/Time: 09/27/2020/12:38:09 PM    Final     EKG: Normal sinus rhythm with nonspecific ST changes  Weights: Filed Weights   09/26/20 2156 09/27/20 1149  Weight: (!) 156.7 kg (!) 156.9 kg     Physical Exam: Blood pressure (!) 136/49, pulse 68, temperature 98.2 F (36.8 C), temperature source Oral, resp. rate 17, height 5\' 1"  (1.549 m), weight (!) 156.9 kg, SpO2 92 %.  Body mass index is 65.38 kg/m. General: Well developed, well nourished, in no acute distress. Head eyes ears nose throat: Normocephalic, atraumatic, sclera non-icteric, no xanthomas, nares are without discharge. No apparent thyromegaly and/or mass  Lungs: Normal respiratory effort.  no wheezes, few basilar rales, no rhonchi.  Heart: RRR with normal S1 S2. no murmur gallop, no rub, PMI is normal size and placement, carotid upstroke normal without bruit, jugular venous pressure is normal Abdomen: Soft, non-tender distended with normoactive bowel sounds. No hepatomegaly. No rebound/guarding. No obvious abdominal masses. Abdominal aorta is normal size without bruit Extremities: 2+ edema.  With severe chronic brawny changes and mottling consistent with chronic lymphedema Peripheral : 2+ bilateral upper extremity pulses, 0+ bilateral femoral pulses, 0+ bilateral dorsal pedal pulse Neuro: Alert and oriented. No facial asymmetry. No focal deficit. Moves all extremities spontaneously. Musculoskeletal:  Normal muscle tone without kyphosis Psych:  Responds to questions appropriately with a normal affect.    Assessment: 64 year old female with acute on chronic diastolic dysfunction congestive heart failure with echocardiogram showing no evidence of significant new valvular heart disease and pulmonary hypertension and CT scan which was showing no evidence of pulmonary embolism and no current evidence of myocardial infarction or acute coronary syndrome  Plan: 1.  Continue intravenous Lasix 40 mg twice per day for acute on chronic diastolic dysfunction congestive heart failure with pulmonary edema slowly improving 2.  Continue above Lasix for lower extremity edema but would consider compression hose and or pneumatic hose.  Patient claims that Unna boots do not work very well because they are more temporary 3.  ACE inhibitor for treatment of congestive heart failure and hypertension and currently would avoid a beta-blocker although may reinstate beta-blocker depending on heart rate at a later in her hospitalization 4.  No further cardiac diagnostics necessary at this time due to no evidence of acute coronary syndrome or myocardial infarction 5.  Rehabilitation with and without physical activity for about  Signed, Corey Skains M.D. [facility] 09/27/2020, 1:07 PM

## 2020-09-27 NOTE — Progress Notes (Signed)
*  PRELIMINARY RESULTS* Echocardiogram 2D Echocardiogram has been performed.  Sherrie Sport 09/27/2020, 11:34 AM

## 2020-09-27 NOTE — Progress Notes (Signed)
PHARMACIST - PHYSICIAN COMMUNICATION  CONCERNING:  Enoxaparin (Lovenox) for DVT Prophylaxis    RECOMMENDATION: Patient was prescribed enoxaprin 40mg  q24 hours for VTE prophylaxis.   Filed Weights   09/26/20 2156  Weight: (!) 156.7 kg (345 lb 7.4 oz)    Body mass index is 63.19 kg/m.  Estimated Creatinine Clearance: 101.2 mL/min (by C-G formula based on SCr of 0.61 mg/dL).   Based on [place] patient is candidate for enoxaparin 0.5mg /kg TBW SQ every 24 hours based on BMI being >30.  DESCRIPTION: Pharmacy has adjusted enoxaparin dose per [facility] policy.  Patient is now receiving enoxaparin 80 mg every 24 hours    Ena Dawley, PharmD Clinical Pharmacist  09/27/2020 7:00 AM

## 2020-09-27 NOTE — H&P (Signed)
History and Physical    Carmen [name]:[id number] DOB: 1956/06/06 DOA: 09/26/2020  PCP: Cletis Athens, MD   Patient coming from: Home  I have personally briefly reviewed patient's old medical records in [place]  Chief Complaint: Shortness of breath  HPI: Carmen [name] is a 64 y.o. female with medical history significant for chronic diastolic heart failure, morbid obesity, chronic lymphedema, chronic respiratory failure on home O2 2 L history of endometrial cancer who presents to the emergency room by EMS with a several day history of shortness of breath and dyspnea on exertion that became acutely worse on the day of arrival. She endorses difficulty lying flat to sleep as well as lower extremity edema. Denies chest pain or cough. Has no nausea, vomiting or diaphoresis no palpitations. Reports that on the day of arrival she was walking to the bathroom and felt like she was going to pass out. On arrival of EMS O2 sat was 88% on O2 at home flow rate of 2 L. ED Course: On arrival she was afebrile, with heart rate 81 blood pressure 166/75 tachypneic at 28 with O2 sat 97% on 4 L. Venous blood gas was unremarkable. First troponin 12>>40. BNP 123. D-dimer 766. CBC and CMP unremarkable. EKG as reviewed by me : Normal sinus rhythm at 74 with no acute ST-T wave changes Chest x-ray: Cardiomegaly with mild to moderate severity pulmonary vascular congestion CTA chest: Negative for PE Patient treated with Lasix. Hospitalist consulted for admission.  Review of Systems: As per HPI otherwise all other systems on review of systems negative.    Past Medical History:  Diagnosis Date  . CHF (congestive heart failure) ([place])   . Endometrial cancer (HCC)    Grade 1  . Morbid obesity ([place])   . Osteoarthritis of both knees     Past Surgical History:  Procedure Laterality Date  . ROBOTIC ASSISTED LAP VAGINAL HYSTERECTOMY  01/10/2011   BSO     reports that she has never smoked. She has never  used smokeless tobacco. She reports that she does not drink alcohol and does not use drugs.  Allergies  Allergen Reactions  . Clarithromycin     REACTION: respiratory issues, can't breathe  . Penicillins     REACTION: can't breath    History reviewed. No pertinent family history.    Prior to Admission medications   Medication Sig Start Date End Date Taking? Authorizing Provider  aspirin 81 MG tablet Take 81 mg by mouth 2 (two) times daily.    [provider]  Calcium Carbonate (CALCIUM 500 PO) Take by mouth.    [provider]  Cholecalciferol (VITAMIN D PO) Take 800 mg by mouth.    [provider]  Cyanocobalamin (VITAMIN B-12 PO) Take 800 mg by mouth.    [provider]  furosemide (LASIX) 40 MG tablet TAKE 2 TABLETS EVERY DAY 04/19/20   Cletis Athens, MD  Multiple Vitamins-Minerals (MEGA MULTI WOMEN PO) Take 1,200 mg by mouth daily.    [provider]  potassium chloride (KLOR-CON) 10 MEQ tablet TAKE 1 TABLET AT BEDTIME 07/05/20   Cletis Athens, MD  sulfamethoxazole-trimethoprim (BACTRIM) 400-80 MG tablet Take 1 tablet by mouth 2 (two) times daily. 05/17/16   Harvest Dark, MD  vitamin E 400 UNIT capsule Take 400 Units by mouth 2 (two) times daily.    [provider]    Physical Exam: Vitals:   09/27/20 0045 09/27/20 0130 09/27/20 0150 09/27/20 0158  BP:  Pulse: 75 (!) 110    Resp: 17     Temp:      TempSrc:      SpO2: 92% 97% 93% 93%  Weight:      Height:         Vitals:   09/27/20 0045 09/27/20 0130 09/27/20 0150 09/27/20 0158  BP:      Pulse: 75 (!) 110    Resp: 17     Temp:      TempSrc:      SpO2: 92% 97% 93% 93%  Weight:      Height:          Constitutional: Alert and oriented x 3 .  Conversational tachypnea, speaking in short sentences HEENT:      Head: Normocephalic and atraumatic.         Eyes: PERLA, EOMI, Conjunctivae are normal. Sclera is non-icteric.       Mouth/Throat: Mucous  membranes are moist.       Neck: Supple with no signs of meningismus. Cardiovascular: Regular rate and rhythm.  3/6  murmurs, gallops, or rubs. 2+ symmetrical distal pulses are present . No JVD.  Nonpitting LE edema Respiratory: Respiratory effort increased.Lungs sounds diminished bilaterally.  Bibasilar crackles, no rhonchi.  Gastrointestinal: Soft, non tender, and non distended with positive bowel sounds. No rebound or guarding. Genitourinary: No CVA tenderness. Musculoskeletal: Nontender with normal range of motion in all extremities. No cyanosis, or erythema of extremities.  Nonpitting edema Neurologic:  Face is symmetric. Moving all extremities. No gross focal neurologic deficits . Skin: Skin is warm, dry.  Stasis dermatitis Psychiatric: Mood and affect are normal    Labs on Admission: I have personally reviewed following labs and imaging studies  CBC: Recent Labs  Lab 09/26/20 2213  WBC 10.0  NEUTROABS 8.4*  HGB 12.8  HCT 39.4  MCV 95.9  PLT 539   Basic Metabolic Panel: Recent Labs  Lab 09/26/20 2213  NA 140  K 4.5  CL 101  CO2 28  GLUCOSE 140*  BUN 28*  CREATININE 0.74  CALCIUM 8.9   GFR: Estimated Creatinine Clearance: 101.2 mL/min (by C-G formula based on SCr of 0.74 mg/dL). Liver Function Tests: Recent Labs  Lab 09/26/20 2213  AST 22  ALT 17  ALKPHOS 85  BILITOT 1.1  PROT 8.2*  ALBUMIN 3.7   No results for input(s): LIPASE, AMYLASE in the last 168 hours. No results for input(s): AMMONIA in the last 168 hours. Coagulation Profile: No results for input(s): INR, PROTIME in the last 168 hours. Cardiac Enzymes: No results for input(s): CKTOTAL, CKMB, CKMBINDEX, TROPONINI in the last 168 hours. BNP (last 3 results) No results for input(s): PROBNP in the last 8760 hours. HbA1C: No results for input(s): HGBA1C in the last 72 hours. CBG: No results for input(s): GLUCAP in the last 168 hours. Lipid Profile: No results for input(s): CHOL, HDL, LDLCALC,  TRIG, CHOLHDL, LDLDIRECT in the last 72 hours. Thyroid Function Tests: No results for input(s): TSH, T4TOTAL, FREET4, T3FREE, THYROIDAB in the last 72 hours. Anemia Panel: No results for input(s): VITAMINB12, FOLATE, FERRITIN, TIBC, IRON, RETICCTPCT in the last 72 hours. Urine analysis:    Component Value Date/Time   COLORURINE YELLOW 01/19/2011 1500   APPEARANCEUR CLEAR 01/19/2011 1500   LABSPEC 1.006 01/19/2011 1500   PHURINE 7.0 01/19/2011 1500   GLUCOSEU NEGATIVE 10/15/2010 2032   HGBUR LARGE (A) 01/19/2011 1500   BILIRUBINUR NEGATIVE 01/19/2011 1500   KETONESUR NEGATIVE 01/19/2011 1500   PROTEINUR  NEGATIVE 01/19/2011 1500   UROBILINOGEN 0.2 01/19/2011 1500   NITRITE NEGATIVE 01/19/2011 1500   LEUKOCYTESUR NEGATIVE 01/19/2011 1500    Radiological Exams on Admission: CT Angio Chest PE W and/or Wo Contrast  Result Date: 09/27/2020 CLINICAL DATA:  Elevated D-dimer. EXAM: CT ANGIOGRAPHY CHEST WITH CONTRAST TECHNIQUE: Multidetector CT imaging of the chest was performed using the standard protocol during bolus administration of intravenous contrast. Multiplanar CT image reconstructions and MIPs were obtained to evaluate the vascular anatomy. CONTRAST:  130mL OMNIPAQUE IOHEXOL 350 MG/ML SOLN COMPARISON:  None. FINDINGS: Cardiovascular: There is moderate severity calcification of the aortic arch. Satisfactory opacification of the pulmonary arteries to the segmental level. No evidence of pulmonary embolism. Normal heart size. No pericardial effusion. Mediastinum/Nodes: There is mild right hilar and AP window lymphadenopathy. Thyroid gland, trachea, and esophagus demonstrate no significant findings. Lungs/Pleura: A 6 mm noncalcified lung nodule versus focal scar seen within the posterior aspect of the left apex. An 8 mm noncalcified lung nodule is seen within the anterolateral aspect of the right upper lobe. A 6 mm posterior right upper lobe noncalcified lung nodule is seen. Adjacent 6 mm  noncalcified lung nodules are seen along the posterolateral aspect of the right lower lobe. There is no evidence of acute infiltrate, pleural effusion or pneumothorax. Upper Abdomen: No acute abnormality. Musculoskeletal: No chest wall abnormality. No acute or significant osseous findings. Review of the MIP images confirms the above findings. IMPRESSION: 1. No evidence of pulmonary embolism. 2. Multiple bilateral noncalcified lung nodules. Correlation with follow-up chest CT is recommended in 3-6 months. 3. Aortic atherosclerosis. Aortic Atherosclerosis (ICD10-I70.0). Electronically Signed   By: Virgina Norfolk M.D.   On: 09/27/2020 01:28   DG Chest Portable 1 View  Result Date: 09/26/2020 CLINICAL DATA:  Shortness of breath. EXAM: PORTABLE CHEST 1 VIEW COMPARISON:  March 12, 2014 FINDINGS: There is no evidence of acute infiltrate, pleural effusion or pneumothorax. Mild to moderate severity prominence of the perihilar pulmonary vasculature is seen. The cardiac silhouette is mildly enlarged. The visualized skeletal structures are unremarkable. IMPRESSION: Cardiomegaly with mild to moderate severity pulmonary vascular congestion. Electronically Signed   By: Virgina Norfolk M.D.   On: 09/26/2020 22:10     Assessment/Plan 64 year old female with history of chronic diastolic heart failure, morbid obesity, chronic respiratory failure on home O2 2 L history of endometrial cancer presenting with several day history of dyspnea on exertion   Acute on chronic respiratory failure with hypoxia (HCC) -O2 sat 88% on home flow rate at 2 L requiring 4 L to maintain sats in the low to mid 90s. Patient tachypneic with increased work of breathing speaking in short sentences. -Secondary to CHF exacerbation -CTA chest negative for acute PE -No evidence of infiltrate on chest x-ray or CTA  Acute on chronic diastolic CHF (congestive heart failure) (HCC) -Several day history of dyspnea on exertion, lower extremity  edema -BNP elevated at 123 with chest x-ray showing cardiomegaly and pulmonary vascular congestion -IV Lasix, lisinopril. Holding beta-blocker for now -Echocardiogram in the a.m. -Cardiology consult      Elevated troponin -Mild troponin increase 12>>40, suspect secondary to demand -No complaints of chest pain and EKG nonacute -Continue to monitor    Morbid obesity with BMI of 60.0-69.9, adult (HCC) -Complicating factor to overall prognosis and care    DVT prophylaxis: Lovenox  Code Status: full code  Family Communication:  none  Disposition Plan: Back to previous home environment Consults called: Cardiology Status:At the time of admission, it  appears that the appropriate admission status for this patient is INPATIENT. This is judged to be reasonable and necessary in order to provide the required intensity of service to ensure the patient's safety given the presenting symptoms, physical exam findings, and initial radiographic and laboratory data in the context of their  Comorbid conditions.   Patient requires inpatient status due to high intensity of service, high risk for further deterioration and high frequency of surveillance required.   I certify that at the point of admission it is my clinical judgment that the patient will require inpatient hospital care spanning beyond [place] MD [facility]     09/27/2020, 2:15 AM

## 2020-09-27 NOTE — Progress Notes (Signed)
  Heart Failure Nurse Navigator Note  HFpEF 60%   She presented to the ED with complaints of worsening SOB,PND and worsening lower extremity edema.  Co morbidities:  Morbid obesity Osteoarhritis  Medications:  Coreg 3.125 mg BID Lasix 40 mg IV BID Lisinopril 5 mg daily  Labs:  BNP 123, BMP pending, troponin 40, lactic acid 0.8.  Weight 156.9 kg  BP 134/55 pulse 68  Assessment:  General- she is awake and alert, in no acute distress.  HEENT- unable to assess for JVD due to body habitus. Pupils equal.  Cardiac- heart tones regular.  Chest- breath sounds clear to anterior auscultation.  Abdomen- rounded soft. Non tender  Musculoskeletal- lower legs edematous, skin hyperkeratosis and  papillomas.  Psych- is pleasant and appropriate, at one point became tearful about her weight and being called fat.  Neuro- speech is clear.    Discussed her cares at home, she states she checks her BP in the afternoon at home, get 120/ to 130/.  She does not weight herself daily.  She tries to be active - cooks breakfast, cleans up the kitchen, cleans the bathroom except for the shower.  Talked that she was in heart failure when she came in, stressed eating low sodium diet and weighing daily.    Given heart failure booklet and zone magnet.  Will continue to follow.  Pricilla Riffle RN, CHFN

## 2020-09-27 NOTE — ED Notes (Signed)
Patient O2 saturation noted to be 86% on 2L nasal cannula, O2 increased to 4L nasal cannula with improvements to 93%. MD Owens Shark made aware.

## 2020-09-27 NOTE — ED Notes (Signed)
Attempted to call report

## 2020-09-27 NOTE — ED Notes (Signed)
Date and time results received: 09/27/20 1:15 AM  Test: Troponin Critical Value: Increase from 12 to 40  Name of Provider Notified: Owens Shark MD

## 2020-09-27 NOTE — Plan of Care (Signed)
°  Problem: Clinical Measurements: Goal: Ability to maintain clinical measurements within normal limits will improve Outcome: Progressing   Problem: Activity: Goal: Risk for activity intolerance will decrease Outcome: Progressing   Problem: Clinical Measurements: Goal: Cardiovascular complication will be avoided Outcome: Progressing   Problem: Clinical Measurements: Goal: Respiratory complications will improve Outcome: Progressing

## 2020-09-28 DIAGNOSIS — R778 Other specified abnormalities of plasma proteins: Secondary | ICD-10-CM | POA: Diagnosis not present

## 2020-09-28 DIAGNOSIS — I5033 Acute on chronic diastolic (congestive) heart failure: Secondary | ICD-10-CM | POA: Diagnosis not present

## 2020-09-28 DIAGNOSIS — J9621 Acute and chronic respiratory failure with hypoxia: Secondary | ICD-10-CM | POA: Diagnosis not present

## 2020-09-28 LAB — CBC
HCT: 36.2 % (ref 36.0–46.0)
Hemoglobin: 11.6 g/dL — ABNORMAL LOW (ref 12.0–15.0)
MCH: 30.6 pg (ref 26.0–34.0)
MCHC: 32 g/dL (ref 30.0–36.0)
MCV: 95.5 fL (ref 80.0–100.0)
Platelets: 160 10*3/uL (ref 150–400)
RBC: 3.79 MIL/uL — ABNORMAL LOW (ref 3.87–5.11)
RDW: 15.3 % (ref 11.5–15.5)
WBC: 6.4 10*3/uL (ref 4.0–10.5)
nRBC: 0 % (ref 0.0–0.2)

## 2020-09-28 LAB — BASIC METABOLIC PANEL
Anion gap: 8 (ref 5–15)
BUN: 17 mg/dL (ref 8–23)
CO2: 33 mmol/L — ABNORMAL HIGH (ref 22–32)
Calcium: 8.7 mg/dL — ABNORMAL LOW (ref 8.9–10.3)
Chloride: 100 mmol/L (ref 98–111)
Creatinine, Ser: 0.65 mg/dL (ref 0.44–1.00)
GFR, Estimated: >60 mL/min (ref >60)
Glucose, Bld: 110 mg/dL — ABNORMAL HIGH (ref 70–99)
Potassium: 3.6 mmol/L (ref 3.5–5.1)
Sodium: 141 mmol/L (ref 135–145)

## 2020-09-28 MED ORDER — ENOXAPARIN SODIUM 80 MG/0.8ML ~~LOC~~ SOLN
0.5000 mg/kg | SUBCUTANEOUS | Status: DC
  Administered 2020-09-29 – 2020-10-08 (×10): 75 mg via SUBCUTANEOUS
  Filled 2020-09-28 (×10): qty 0.8

## 2020-09-28 MED ORDER — LISINOPRIL 10 MG PO TABS
10.0000 mg | ORAL_TABLET | Freq: Every day | ORAL | Status: DC
  Administered 2020-09-28: 10 mg via ORAL
  Filled 2020-09-28: qty 1

## 2020-09-28 MED ORDER — LORATADINE 10 MG PO TABS
10.0000 mg | ORAL_TABLET | Freq: Every day | ORAL | Status: DC
  Administered 2020-09-28 – 2020-10-08 (×11): 10 mg via ORAL
  Filled 2020-09-28 (×11): qty 1

## 2020-09-28 NOTE — Evaluation (Signed)
Occupational Therapy Evaluation Patient Details Name: Carmen [name] MRN: [id number] DOB: Dec 08, 1955 Today's Date: 09/28/2020    History of Present Illness Patient is a 64 year old female who presents to ED with increased SOB and dyspnea.  Imaging negative for PE. PMH of chronic diastolic HF, morbid oesity, chronic lymphedema, chronic respiratory failure on home O2 with 2L, endometrial cancer, chronic lymphedema.   Clinical Impression   Carmen [name] was seen for OT evaluation this date. Prior to hospital admission, pt was MOD I for mobility and ADLs using SPC or RW in home and handicap shower. Pt lives c husband who assists c ADLs PRN on "bad days." Pt presents to acute OT demonstrating impaired ADL performance and functional mobility 2/2 decreased LB access, functional strength/balance deficits, decreased activity tolerance, and BLE edema.  Pt deferred mobility this date, pt became tearful at notion of moving stating that she just started Lasix and was anxious of leaking. Pt sompleted limited bed level evaluation, instructed on importance of mobility and BUE HEP for strengthening. Pt currently requires MOD I for grooming tasks at bed level. MAX A for LBD at bed level. Anticipate MAX A rolling for toileting. MAX A + B rails to long sit briefly at bed level. SpO2 88% on 4L [place], improved c PLB and repositioning. Pt would benefit from skilled OT to address noted impairments and functional limitations (see below for any additional details) in order to maximize safety and independence while minimizing falls risk and caregiver burden. Upon hospital discharge, recommend STR to maximize pt safety and return to PLOF.     Follow Up Recommendations  SNF    Equipment Recommendations  Other (comment) (TBD)    Recommendations for Other Services       Precautions / Restrictions Precautions Precautions: Fall Restrictions Weight Bearing Restrictions: No Other Position/Activity Restrictions: bilateral LE  edema/lymphadema      Mobility Bed Mobility Overal bed mobility: Needs Assistance      General bed mobility comments: Pt refused bed mobility, tearful and concerned b/c just got lasix. MAX A + B rails to long sit briefly at bed level.    Transfers      General transfer comment: Pt refused        ADL either performed or assessed with clinical judgement   ADL Overall ADL's : Needs assistance/impaired    General ADL Comments: MOD I for grooming tasks at bed level. MAX A for LBD at bed level. ANticipate MAX A rolling for toileting                  Pertinent Vitals/Pain Pain Assessment: Faces Faces Pain Scale: Hurts little more Pain Location: back Pain Descriptors / Indicators: Aching Pain Intervention(s): Limited activity within patient's tolerance;Monitored during session;Repositioned     Hand Dominance Right   Extremity/Trunk Assessment Upper Extremity Assessment Upper Extremity Assessment: Overall WFL for tasks assessed   Lower Extremity Assessment Lower Extremity Assessment: RLE deficits/detail;LLE deficits/detail RLE Deficits / Details: refuses mobility, grossly 2-/5 LLE Deficits / Details: refuses mobility, grossly 2-/5       Communication Communication Communication: No difficulties   Cognition Arousal/Alertness: Awake/alert Behavior During Therapy: WFL for tasks assessed/performed Overall Cognitive Status: Within Functional Limits for tasks assessed      General Comments: Tearful    General Comments  SpO2 88% on 4L , improved c PLB and repositioning    Exercises Exercises: Other exercises Other Exercises Other Exercises: Pt educated re: OT role, DME recs, d/c recs, falls prevention,  importance of mobiltiy for functional stregnthening,  Other Exercises: Bed mobility, simulated UBD   Shoulder Instructions      Home Living Family/patient expects to be discharged to:: Private residence Living Arrangements: Spouse/significant other Available  Help at Discharge: Family Type of Home: House Home Access: Stairs to enter Technical brewer of Steps: 3 Entrance Stairs-Rails: Left Home Layout: One level     Bathroom Shower/Tub: Occupational psychologist: Handicapped height     Home Equipment: Environmental consultant - 2 wheels;Cane - single point;Shower seat - built in;Grab bars - tub/shower;Grab bars - toilet          Prior Functioning/Environment Level of Independence: Needs assistance  Gait / Transfers Assistance Needed: able to walk short distances in home with walker and 2 L oxygen ADL's / Homemaking Assistance Needed: able to dress and bath ind per patient report. Husband helps when she is having a bad day.            OT Problem List: Decreased strength;Decreased range of motion;Decreased activity tolerance;Impaired balance (sitting and/or standing);Decreased safety awareness;Increased edema      OT Treatment/Interventions: Self-care/ADL training;Therapeutic exercise;Energy conservation;DME and/or AE instruction;Therapeutic activities;Patient/family education;Balance training    OT Goals(Current goals can be found in the care plan section) Acute Rehab OT Goals Patient Stated Goal: to be able to move OT Goal Formulation: With patient Time For Goal Achievement: 10/12/20 Potential to Achieve Goals: Good ADL Goals Pt Will Perform Grooming: with supervision;sitting Pt Will Perform Lower Body Dressing: with mod assist;sitting/lateral leans;with caregiver independent in assisting Pt Will Transfer to Toilet: with min assist (rolling at bed level)  OT Frequency: Min 1X/week   Barriers to D/C: Inaccessible home environment             AM-PAC OT "6 Clicks" Daily Activity     Outcome Measure Help from another person eating meals?: A Little Help from another person taking care of personal grooming?: A Little Help from another person toileting, which includes using toliet, bedpan, or urinal?: A Lot Help from another  person bathing (including washing, rinsing, drying)?: A Lot Help from another person to put on and taking off regular upper body clothing?: A Lot Help from another person to put on and taking off regular lower body clothing?: A Lot 6 Click Score: 14   End of Session Equipment Utilized During Treatment: Oxygen (4L [place])  Activity Tolerance: Patient limited by fatigue Patient left: in bed;with call bell/phone within reach;with bed alarm set  OT Visit Diagnosis: Other abnormalities of gait and mobility (R26.89);Muscle weakness (generalized) (M62.81)                Time: 1610-9604 OT Time Calculation (min): 19 min Charges:  OT General Charges $OT Visit: 1 Visit OT Evaluation $OT Eval Low Complexity: 1 Low OT Treatments $Self Care/Home Management : 8-22 mins  Dessie Coma, M.S. OTR/L  09/28/20, 10:56 AM  ascom [phone]

## 2020-09-28 NOTE — Progress Notes (Signed)
[facility] Encounter Note  Patient: Carmen [name] / Admit Date: 09/26/2020 / Date of Encounter: 09/28/2020, 12:53 PM   Subjective: Patient significantly improved from the oxygenation standpoint with no evidence of PND orthopnea. Patient has had significant of urine output with intravenous Lasix. Patient has not had any change in her glomerular filtration rate at this time. Lower extremity edema still significant but appears that this is a chronic lymphedema. No evidence of myocardial infarction with peak troponin of 40.  Review of Systems: Positive for: Shortness of breath and edema Negative for: Vision change, hearing change, syncope, dizziness, nausea, vomiting,diarrhea, bloody stool, stomach pain, cough, congestion, diaphoresis, urinary frequency, urinary pain,skin lesions, skin rashes Others previously listed  Objective: Telemetry: Normal sinus rhythm Physical Exam: Blood pressure 135/61, pulse 68, temperature 97.9 F (36.6 C), temperature source Oral, resp. rate 20, height 5\' 1"  (1.549 m), weight (!) 151.9 kg, SpO2 90 %. Body mass index is 63.26 kg/m. General: Well developed, well nourished, in no acute distress. Head: Normocephalic, atraumatic, sclera non-icteric, no xanthomas, nares are without discharge. Neck: No apparent masses Lungs: Normal respirations with few wheezes, no rhonchi, no rales , few basilar crackles   Heart: Regular rate and rhythm, normal S1 S2, no murmur, no rub, no gallop, PMI is normal size and placement, carotid upstroke normal without bruit, jugular venous pressure normal Abdomen: Soft, non-tender, non-distended with normoactive bowel sounds. No hepatosplenomegaly. Abdominal aorta is normal size without bruit Extremities: 2+ edema, no clubbing, no cyanosis, no ulcers, significant bilateral lower extremity brawny changes Peripheral: 2+ radial, zero+ femoral, zero+ dorsal pedal pulses Neuro: Alert and oriented. Moves all extremities  spontaneously. Psych:  Responds to questions appropriately with a normal affect.   Intake/Output Summary (Last 24 hours) at 09/28/2020 1253 Last data filed at 09/28/2020 1100 Gross per 24 hour  Intake 243 ml  Output 5100 ml  Net -4857 ml    Inpatient Medications:  . carvedilol  3.125 mg Oral BID WC  . [START ON 09/29/2020] enoxaparin (LOVENOX) injection  0.5 mg/kg Subcutaneous Q24H  . furosemide  40 mg Intravenous BID  . lisinopril  5 mg Oral Daily  . loratadine  10 mg Oral Daily  . sodium chloride flush  3 mL Intravenous Q12H   Infusions:  . sodium chloride      Labs: Recent Labs    09/26/20 2213 09/26/20 2213 09/27/20 0350 09/28/20 0547  NA 140  --   --  141  K 4.5  --   --  3.6  CL 101  --   --  100  CO2 28  --   --  33*  GLUCOSE 140*  --   --  110*  BUN 28*  --   --  17  CREATININE 0.74   < > 0.61 0.65  CALCIUM 8.9  --   --  8.7*   < > = values in this interval not displayed.   Recent Labs    09/26/20 2213  AST 22  ALT 17  ALKPHOS 85  BILITOT 1.1  PROT 8.2*  ALBUMIN 3.7   Recent Labs    09/26/20 2213 09/26/20 2213 09/27/20 0350 09/28/20 0547  WBC 10.0   < > 9.7 6.4  NEUTROABS 8.4*  --   --   --   HGB 12.8   < > 11.9* 11.6*  HCT 39.4   < > 36.6 36.2  MCV 95.9   < > 94.3 95.5  PLT 166   < >  170 160   < > = values in this interval not displayed.   No results for input(s): CKTOTAL, CKMB, TROPONINI in the last 72 hours. Invalid input(s): POCBNP No results for input(s): HGBA1C in the last 72 hours.   Weights: Filed Weights   09/26/20 2156 09/27/20 1149 09/28/20 0350  Weight: (!) 156.7 kg (!) 156.9 kg (!) 151.9 kg     Radiology/Studies:  CT Angio Chest PE W and/or Wo Contrast  Result Date: 09/27/2020 CLINICAL DATA:  Elevated D-dimer. EXAM: CT ANGIOGRAPHY CHEST WITH CONTRAST TECHNIQUE: Multidetector CT imaging of the chest was performed using the standard protocol during bolus administration of intravenous contrast. Multiplanar CT image  reconstructions and MIPs were obtained to evaluate the vascular anatomy. CONTRAST:  175mL OMNIPAQUE IOHEXOL 350 MG/ML SOLN COMPARISON:  None. FINDINGS: Cardiovascular: There is moderate severity calcification of the aortic arch. Satisfactory opacification of the pulmonary arteries to the segmental level. No evidence of pulmonary embolism. Normal heart size. No pericardial effusion. Mediastinum/Nodes: There is mild right hilar and AP window lymphadenopathy. Thyroid gland, trachea, and esophagus demonstrate no significant findings. Lungs/Pleura: A 6 mm noncalcified lung nodule versus focal scar seen within the posterior aspect of the left apex. An 8 mm noncalcified lung nodule is seen within the anterolateral aspect of the right upper lobe. A 6 mm posterior right upper lobe noncalcified lung nodule is seen. Adjacent 6 mm noncalcified lung nodules are seen along the posterolateral aspect of the right lower lobe. There is no evidence of acute infiltrate, pleural effusion or pneumothorax. Upper Abdomen: No acute abnormality. Musculoskeletal: No chest wall abnormality. No acute or significant osseous findings. Review of the MIP images confirms the above findings. IMPRESSION: 1. No evidence of pulmonary embolism. 2. Multiple bilateral noncalcified lung nodules. Correlation with follow-up chest CT is recommended in 3-6 months. 3. Aortic atherosclerosis. Aortic Atherosclerosis (ICD10-I70.0). Electronically Signed   By: Virgina Norfolk M.D.   On: 09/27/2020 01:28   DG Chest Portable 1 View  Result Date: 09/26/2020 CLINICAL DATA:  Shortness of breath. EXAM: PORTABLE CHEST 1 VIEW COMPARISON:  March 12, 2014 FINDINGS: There is no evidence of acute infiltrate, pleural effusion or pneumothorax. Mild to moderate severity prominence of the perihilar pulmonary vasculature is seen. The cardiac silhouette is mildly enlarged. The visualized skeletal structures are unremarkable. IMPRESSION: Cardiomegaly with mild to moderate  severity pulmonary vascular congestion. Electronically Signed   By: Virgina Norfolk M.D.   On: 09/26/2020 22:10   ECHOCARDIOGRAM COMPLETE  Result Date: 09/27/2020    ECHOCARDIOGRAM REPORT   Patient Name:   Carmen [name] Date of Exam: 09/27/2020 Medical Rec #:  [id number]       Height:       62.0 in Accession #:    [id number]      Weight:       345.5 lb Date of Birth:  Feb 06, 1956       BSA:          2.412 m Patient Age:    64 years        BP:           118/60 mmHg Patient Gender: F               HR:           64 bpm. Exam Location:  [facility] Procedure: 2D Echo, Cardiac Doppler and Color Doppler Indications:     CHF- acute diastolic 563.87  History:         Patient has no  prior history of Echocardiogram examinations.                  CHF.  Sonographer:     Sherrie Sport RDCS (AE) Referring Phys:  [id number] Athena Masse Diagnosing Phys: Kathlyn Sacramento MD  Sonographer Comments: No apical window, no subcostal window and Technically challenging study due to limited acoustic windows. IMPRESSIONS  1. Left ventricular ejection fraction, by estimation, is 55 to 60%. The left ventricle has normal function. Left ventricular endocardial border not optimally defined to evaluate regional wall motion. Left ventricular diastolic function could not be evaluated.  2. Right ventricular systolic function is normal. The right ventricular size is normal.  3. Left atrial size was moderately dilated.  4. The mitral valve is normal in structure. No evidence of mitral valve regurgitation. No evidence of mitral stenosis.  5. The aortic valve is normal in structure. Aortic valve regurgitation is not visualized. Mild to moderate aortic valve sclerosis/calcification is present, without any evidence of aortic stenosis.  6. Very challenging image quality with limited views. FINDINGS  Left Ventricle: Left ventricular ejection fraction, by estimation, is 55 to 60%. The left ventricle has normal function. Left ventricular endocardial border not  optimally defined to evaluate regional wall motion. The left ventricular internal cavity size was normal in size. There is no left ventricular hypertrophy. Left ventricular diastolic function could not be evaluated. Right Ventricle: The right ventricular size is normal. No increase in right ventricular wall thickness. Right ventricular systolic function is normal. Left Atrium: Left atrial size was moderately dilated. Right Atrium: Right atrial size was normal in size. Pericardium: There is no evidence of pericardial effusion. Mitral Valve: The mitral valve is normal in structure. No evidence of mitral valve regurgitation. No evidence of mitral valve stenosis. Tricuspid Valve: The tricuspid valve is normal in structure. Tricuspid valve regurgitation is trivial. No evidence of tricuspid stenosis. Aortic Valve: The aortic valve is normal in structure. Aortic valve regurgitation is not visualized. Mild to moderate aortic valve sclerosis/calcification is present, without any evidence of aortic stenosis. Pulmonic Valve: The pulmonic valve was normal in structure. Pulmonic valve regurgitation is not visualized. No evidence of pulmonic stenosis. Aorta: The aortic root is normal in size and structure. Venous: The inferior vena cava was not well visualized. IAS/Shunts: No atrial level shunt detected by color flow Doppler.  LEFT VENTRICLE PLAX 2D LVIDd:         4.22 cm LVIDs:         2.75 cm LV PW:         1.14 cm LV IVS:        0.99 cm LVOT diam:     2.00 cm LVOT Area:     3.14 cm  LEFT ATRIUM         Index LA diam:    4.50 cm 1.87 cm/m                        PULMONIC VALVE AORTA                 PV Vmax:        0.92 m/s Ao Root diam: 2.40 cm PV Peak grad:   3.4 mmHg                       RVOT Peak grad: 16 mmHg   SHUNTS Systemic Diam: 2.00 cm Kathlyn Sacramento MD Electronically signed by Kathlyn Sacramento MD Signature Date/Time:  09/27/2020/12:38:09 PM    Final      Assessment and Recommendation  64 y.o. female with known  hypertension hyperlipidemia and severe chronic lower extremity brawny changes and lymphedema with acute on chronic diastolic dysfunction congestive heart failure without evidence of myocardial infarction now improved with intravenous Lasix One. Okay for continuation of intravenous Lasix for improvements of lower extremity and lymphedema in addition to continuation of treatment for pulmonary edema and acute on chronic diastolic dysfunction congestive heart failure 2. Will increase lisinopril to 10 mg each day in addition to carvedilol for diastolic dysfunction heart failure and hypertension control 3. Okay for change furosemide injection to oral furosemide tomorrow for possible discharged home and or outpatient planning 4. No further cardiac diagnostics necessary at this time  Signed, Serafina Royals M.D. FACC

## 2020-09-28 NOTE — TOC Initial Note (Signed)
Transition of Care [facility]) - Initial/Assessment Note    Patient Details  Name: Carmen [name] MRN: [id number] Date of Birth: 02-Mar-1956  [facility] CM/SW Contact:    Magnus Ivan, LCSW Phone Number: 09/28/2020, 1:35 PM  Clinical Narrative:             CSW spoke to patient. Patient lives with her husband who transports her to appointments. PCP is Dr. Lavera Guise. Pharmacy is [facility]. No DME or SNF history. Had [place] through [facility] in the past, says she would not want to use them again. Discussed PT and OT recommending SNF. Patient said she is not sure if she is agreeable to SNF, would like to talk to her husband about it this evening when he gets off work then let [place] know. CSW will continue to follow.    Expected Discharge Plan: Skilled Nursing Facility Barriers to Discharge: Continued Medical Work up   Patient Goals and CMS Choice Patient states their goals for this hospitalization and ongoing recovery are:: still deciding if shes willing to go to SNF CMS Medicare.gov Compare Post Acute Care list provided to:: Patient Choice offered to / list presented to : Patient  Expected Discharge Plan and Services Expected Discharge Plan: [place] arrangements for the past 2 months: Single Family Home                                      Prior Living Arrangements/Services Living arrangements for the past 2 months: Single Family Home Lives with:: Spouse Patient language and need for interpreter reviewed:: Yes Do you feel safe going back to the place where you live?: Yes      Need for Family Participation in Patient Care: Yes (Comment) Care giver support system in place?: Yes (comment)   Criminal Activity/Legal Involvement Pertinent to Current Situation/Hospitalization: No - Comment as needed  Activities of Daily Living Home Assistive Devices/Equipment: Oxygen, Walker (specify type), Cane (specify quad or straight),  Built-in shower seat, Blood pressure cuff ADL Screening (condition at time of admission) Patient's cognitive ability adequate to safely complete daily activities?: Yes Is the patient deaf or have difficulty hearing?: No Does the patient have difficulty seeing, even when wearing glasses/contacts?: No Does the patient have difficulty concentrating, remembering, or making decisions?: No Patient able to express need for assistance with ADLs?: Yes Does the patient have difficulty dressing or bathing?: No Independently performs ADLs?: Yes (appropriate for developmental age) Does the patient have difficulty walking or climbing stairs?: Yes Weakness of Legs: Both Weakness of Arms/Hands: None  Permission Sought/Granted Permission sought to share information with : Chartered certified accountant granted to share information with : Yes, Verbal Permission Granted     Permission granted to share info w AGENCY: SNF, HH        Emotional Assessment       Orientation: : Oriented to Self, Oriented to Place, Oriented to  Time, Oriented to Situation Alcohol / Substance Use: Not Applicable Psych Involvement: No (comment)  Admission diagnosis:  Acute CHF (congestive heart failure) (HCC) [I50.9] Dyspnea, unspecified type [R06.00] Congestive heart failure, unspecified HF chronicity, unspecified heart failure type ([place]) [I50.9] Patient Active Problem List   Diagnosis Date Noted  . Acute on chronic respiratory failure with hypoxia ([place]) 09/27/2020  . Morbid obesity with BMI of 60.0-69.9, adult ([place]) 09/27/2020  .  Chronic diastolic CHF (congestive heart failure) ([place]) 09/27/2020  . Elevated troponin 09/27/2020  . Acute on chronic diastolic CHF (congestive heart failure) ([place]) 09/27/2020  . Acute CHF (congestive heart failure) ([place]) 09/27/2020  . Uterine carcinoma ([place]) 01/30/2012  . ABNORMAL GLANDULAR PAPANICOLAOU SMEAR OF CERVIX 10/10/2010  . SHORTNESS OF BREATH 09/30/2010  . OBESITY  09/02/2010  . KNEE PAIN, BILATERAL 09/02/2010   PCP:  Cletis Athens, MD Pharmacy:   [facility], [place] [address] Phone: [phone] Fax: [phone]     Social Determinants of Health (SDOH) Interventions    Readmission Risk Interventions No flowsheet data found.

## 2020-09-28 NOTE — Progress Notes (Signed)
[place] [facility] [place] NAME: Carmen [name]    MR#:  [id number]  DATE OF BIRTH:  1956-02-01  SUBJECTIVE:  CHIEF COMPLAINT:   Chief Complaint  Patient presents with  . Shortness of Breath  Feels better than on admission.  Her back is hurting her and wants to adjust her bed and ideally get up from the bed and sit in the chair if possible she is amenable for walk as well. REVIEW OF SYSTEMS:  Review of Systems  Constitutional: Positive for malaise/fatigue. Negative for diaphoresis, fever and weight loss.  HENT: Negative for ear discharge, ear pain, hearing loss, nosebleeds, sore throat and tinnitus.   Eyes: Negative for blurred vision and pain.  Respiratory: Positive for shortness of breath. Negative for cough, hemoptysis and wheezing.   Cardiovascular: Positive for leg swelling. Negative for chest pain, palpitations and orthopnea.  Gastrointestinal: Negative for abdominal pain, blood in stool, constipation, diarrhea, heartburn, nausea and vomiting.  Genitourinary: Negative for dysuria, frequency and urgency.  Musculoskeletal: Positive for back pain. Negative for myalgias.  Skin: Negative for itching and rash.  Neurological: Negative for dizziness, tingling, tremors, focal weakness, seizures, weakness and headaches.  Psychiatric/Behavioral: Negative for depression. The patient is not nervous/anxious.    DRUG ALLERGIES:   Allergies  Allergen Reactions  . Clarithromycin     REACTION: respiratory issues, can't breathe  . Penicillins     REACTION: can't breath   VITALS:  Blood pressure (!) 148/65, pulse 70, temperature 98.3 F (36.8 C), temperature source Oral, resp. rate 19, height 5\' 1"  (1.549 m), weight (!) 151.9 kg, SpO2 91 %. PHYSICAL EXAMINATION:  Physical Exam HENT:     Head: Normocephalic and atraumatic.  Eyes:     Conjunctiva/sclera: Conjunctivae normal.     Pupils: Pupils are equal, round, and reactive to light.  Neck:     Thyroid: No  thyromegaly.     Trachea: No tracheal deviation.  Cardiovascular:     Rate and Rhythm: Normal rate and regular rhythm.     Heart sounds: Normal heart sounds.     Comments: Chronic lymphedema changes in both lower extremities Pulmonary:     Effort: Pulmonary effort is normal. No respiratory distress.     Breath sounds: Normal breath sounds. No wheezing.  Chest:     Chest wall: No tenderness.  Abdominal:     General: Bowel sounds are normal. There is no distension.     Palpations: Abdomen is soft.     Tenderness: There is no abdominal tenderness.  Musculoskeletal:        General: Normal range of motion.     Cervical back: Normal range of motion and neck supple.     Right lower leg: 2+ Pitting Edema present.     Left lower leg: 2+ Pitting Edema present.  Skin:    General: Skin is warm and dry.     Findings: No rash.  Neurological:     Mental Status: She is alert and oriented to person, place, and time.     Cranial Nerves: No cranial nerve deficit.    LABORATORY PANEL:  Female CBC Recent Labs  Lab 09/28/20 0547  WBC 6.4  HGB 11.6*  HCT 36.2  PLT 160   ------------------------------------------------------------------------------------------------------------------ Chemistries  Recent Labs  Lab 09/26/20 2213 09/27/20 0350 09/28/20 0547  NA 140  --  141  K 4.5  --  3.6  CL 101  --  100  CO2 28  --  33*  GLUCOSE 140*  --  110*  BUN 28*  --  17  CREATININE 0.74   < > 0.65  CALCIUM 8.9  --  8.7*  AST 22  --   --   ALT 17  --   --   ALKPHOS 85  --   --   BILITOT 1.1  --   --    < > = values in this interval not displayed.   RADIOLOGY:  No results found. ASSESSMENT AND PLAN:  64 year old female with history of chronic diastolic heart failure, morbid obesity, chronic respiratory failure on home O2 2 L history of endometrial cancer presenting with several day history of dyspnea on exertion  Acute on chronic respiratory failure with hypoxia (HCC) -O2 sat 88% on  home flow rate at 2 L requiring 4 L to maintain sats in the low to mid 90s.  -Secondary to CHF exacerbation -CTA chest negative for acute PE -No evidence of infiltrate on chest x-ray or CTA -Wean oxygen as able, check ambulatory pulse ox  Acute on chronic diastolic CHF (congestive heart failure) (HCC) -Several day history of dyspnea on exertion, lower extremity edema -chest x-ray showing cardiomegaly and pulmonary vascular congestion -IV Lasix 40 mg twice daily, continue lisinopril, Coreg, -3.8 L fluid balance -Echocardiogram was overall a difficult study due to body habitus but shows normal LV systolic function. -Cardiology input appreciated  Elevated troponin -Due to demand ischemia.  No chest pain/MI  Chronic lymphedema -This is likely her biggest challenge.  She reports this going on since 2015 but her PCP gave her diagnosis last year She is not using Unna boots or following with any lymphedema clinic as of now -She may benefit from lymphedema clinic follow-up at discharge  Morbid obesity with BMI of 60.0-69.9, adult ([place]) -Complicating factor to overall prognosis and care  Body mass index is 63.26 kg/m.    Can move her to off unit telemetry on any MedSurg floor  Status is: Inpatient  Remains inpatient appropriate because:IV treatments appropriate due to intensity of illness or inability to take PO   Dispo: The patient is from: Home              Anticipated d/c is to: SNF              Anticipated d/c date is: 3 days              Patient currently is not medically stable to d/c.  Needs ongoing diuresis and likely placement based on PT eval.  TOC team aware and consulted    DVT prophylaxis:     Lovenox subcu  Family Communication: discussed with patient.  Try to call her husband Legrand Como at [phone] but no luck  All the records are reviewed and case discussed with Care Management/Social Worker. Management plans discussed with the patient, nursing and they  are in agreement.  CODE STATUS: Full Code  TOTAL TIME TAKING CARE OF THIS PATIENT: 35 minutes.   More than 50% of the time was spent in counseling/coordination of care: YES  POSSIBLE D/C IN 2-3 DAYS, DEPENDING ON CLINICAL CONDITION.   Max Sane M.D on 09/28/2020 at 11:56 AM  [facility]   CC: Primary care physician; Cletis Athens, MD  Note: This dictation was prepared with Dragon dictation along with smaller phrase technology. Any transcriptional errors that result from this process are unintentional.

## 2020-09-28 NOTE — Consult Note (Signed)
[place] Nurse Consult Note: Reason for Consult:Untreated lymphedema and CHF exacerbation with chronic respiratory failure.  Wound type: blistering from edema Pressure Injury POA: NA Measurement:scattered serum filled blisters Wound JQB:HALPFX blistering Drainage (amount, consistency, odor) minimal serous weeping Periwound:chronic edema Dressing procedure/placement/frequency: Cleanse bilateral lower legs with soap and water and pat dry. Apply Xeroform gauze to blisters.  Wrap both legs with kerlix from below toes to below knee and secure with self adherent ace wrap.   Will not follow at this time.  Please re-consult if needed.  Domenic Moras MSN, RN, FNP-BC CWON Wound, Ostomy, Continence Nurse Pager [phone] Change daily.

## 2020-09-28 NOTE — Plan of Care (Signed)

## 2020-09-29 DIAGNOSIS — I5033 Acute on chronic diastolic (congestive) heart failure: Secondary | ICD-10-CM | POA: Diagnosis not present

## 2020-09-29 LAB — CBC
HCT: 38.7 % (ref 36.0–46.0)
Hemoglobin: 12.5 g/dL (ref 12.0–15.0)
MCH: 30.9 pg (ref 26.0–34.0)
MCHC: 32.3 g/dL (ref 30.0–36.0)
MCV: 95.6 fL (ref 80.0–100.0)
Platelets: 156 10*3/uL (ref 150–400)
RBC: 4.05 MIL/uL (ref 3.87–5.11)
RDW: 14.9 % (ref 11.5–15.5)
WBC: 7.6 10*3/uL (ref 4.0–10.5)
nRBC: 0 % (ref 0.0–0.2)

## 2020-09-29 LAB — BASIC METABOLIC PANEL
Anion gap: 10 (ref 5–15)
BUN: 16 mg/dL (ref 8–23)
CO2: 32 mmol/L (ref 22–32)
Calcium: 9 mg/dL (ref 8.9–10.3)
Chloride: 100 mmol/L (ref 98–111)
Creatinine, Ser: 0.69 mg/dL (ref 0.44–1.00)
GFR, Estimated: >60 mL/min (ref >60)
Glucose, Bld: 121 mg/dL — ABNORMAL HIGH (ref 70–99)
Potassium: 3.5 mmol/L (ref 3.5–5.1)
Sodium: 142 mmol/L (ref 135–145)

## 2020-09-29 MED ORDER — LISINOPRIL 20 MG PO TABS
20.0000 mg | ORAL_TABLET | Freq: Every day | ORAL | Status: DC
  Administered 2020-09-29 – 2020-10-08 (×10): 20 mg via ORAL
  Filled 2020-09-29 (×10): qty 1

## 2020-09-29 NOTE — Progress Notes (Addendum)
Physical Therapy Treatment Patient Details Name: Carmen [name] MRN: [id number] DOB: Mar 20, 1956 Today's Date: 09/29/2020    History of Present Illness Patient is a 64 year old female who presents to ED with increased SOB and dyspnea.  Imaging negative for PE. PMH of chronic diastolic HF, morbid oesity, chronic lymphedema, chronic respiratory failure on home O2 with 2L, endometrial cancer, chronic lymphedema.    PT Comments    Pt is making gradual progress towards goals, however still needs significant assist for mobility and complains of pain in B post knees from sheering forces. B LE weakness present. Pt on 4L of O2 with sats decreasing quickly with exertion, RN notified. Left on 4L with O2 sats at 86%. Pt doesn't appear to be at baseline and she is typically able to ambulate short distances and at this time needs 2 assist for assist and safety. Continue to rec SNF for disposition.   Follow Up Recommendations  SNF     Equipment Recommendations  Wheelchair (measurements PT)    Recommendations for Other Services       Precautions / Restrictions Precautions Precautions: Fall Restrictions Weight Bearing Restrictions: No Other Position/Activity Restrictions: bilateral LE edema/lymphadema    Mobility  Bed Mobility Overal bed mobility: Needs Assistance Bed Mobility: Supine to Sit     Supine to sit: Mod assist;Max assist     General bed mobility comments: needs assist for B LEs and moves very slowly secondary to pressure sores on post knee. Once seated, able to sit with upright posture and only supervision. During transition, noted pt with cyanosis around lips. O2 sats decreased to 79-80% on 4L of O2. Takes extended time for recovery to 91%.  Transfers Overall transfer level: Needs assistance Equipment used: 2 person hand held assist Transfers: Sit to/from Stand Sit to Stand: Mod assist;+2 physical assistance         General transfer comment: Therapist and husband assisted  to stand with L knee blocked. Once standing, able to hold onto RW and maintain with cga/occasional min assist  Ambulation/Gait Ambulation/Gait assistance: Min assist Gait Distance (Feet): 5 Feet Assistive device: Rolling walker (2 wheeled) Gait Pattern/deviations: Step-to pattern     General Gait Details: short steps with wide stance, ideally needs BRW due to leg girth. Able to take a few steps at bedside up side of bed. No LOB noted   Stairs             Wheelchair Mobility    Modified Rankin (Stroke Patients Only)       Balance Overall balance assessment: Needs assistance Sitting-balance support: Feet supported;Bilateral upper extremity supported Sitting balance-Leahy Scale: Fair     Standing balance support: Bilateral upper extremity supported Standing balance-Leahy Scale: Fair                              Cognition Arousal/Alertness: Awake/alert Behavior During Therapy: WFL for tasks assessed/performed Overall Cognitive Status: Within Functional Limits for tasks assessed                                        Exercises Other Exercises Other Exercises: bed saturated with urine despite purewick device. During standing, 2nd person able to assist in hygiene while this therapist assisted in balance and stamina.    General Comments        Pertinent Vitals/Pain Pain Assessment: Faces  Faces Pain Scale: Hurts little more Pain Location: post knee sores from sheering forces Pain Descriptors / Indicators: Aching Pain Intervention(s): Limited activity within patient's tolerance;Repositioned    Home Living                      Prior Function            PT Goals (current goals can now be found in the care plan section) Acute Rehab PT Goals Patient Stated Goal: to be able to move PT Goal Formulation: With patient Time For Goal Achievement: 10/11/20 Potential to Achieve Goals: Fair Progress towards PT goals: Progressing  toward goals    Frequency    Min 2X/week      PT Plan Current plan remains appropriate    Co-evaluation              AM-PAC PT "6 Clicks" Mobility   Outcome Measure  Help needed turning from your back to your side while in a flat bed without using bedrails?: A Lot Help needed moving from lying on your back to sitting on the side of a flat bed without using bedrails?: A Lot Help needed moving to and from a bed to a chair (including a wheelchair)?: A Lot Help needed standing up from a chair using your arms (e.g., wheelchair or bedside chair)?: A Lot Help needed to walk in hospital room?: A Lot Help needed climbing 3-5 steps with a railing? : Total 6 Click Score: 11    End of Session Equipment Utilized During Treatment: Oxygen Activity Tolerance: Patient tolerated treatment well Patient left: in bed;with bed alarm set;with nursing/sitter in room;with family/visitor present Nurse Communication: Mobility status PT Visit Diagnosis: Unsteadiness on feet (R26.81);Other abnormalities of gait and mobility (R26.89);Muscle weakness (generalized) (M62.81);Difficulty in walking, not elsewhere classified (R26.2);Pain Pain - Right/Left: Right Pain - part of body: Knee     Time: 9794-8016 PT Time Calculation (min) (ACUTE ONLY): 38 min  Charges:  $Therapeutic Exercise: 23-37 mins $Therapeutic Activity: 8-22 mins                     Greggory Stallion, PT, DPT [phone]    Vaudine Dutan 09/29/2020, 4:53 PM

## 2020-09-29 NOTE — Progress Notes (Signed)
[facility] Encounter Note  Patient: Carmen [name] / Admit Date: 09/26/2020 / Date of Encounter: 09/29/2020, 8:21 AM   Subjective: Patient significantly improved from the oxygenation standpoint with no evidence of PND orthopnea. Patient has had significant of urine output with intravenous Lasix. Patient has not had any change in her glomerular filtration rate at this time. Lower extremity edema still significant but appears that this is a chronic lymphedema. No evidence of myocardial infarction with peak troponin of 40.  Echocardiogram showing normal LV systolic function with ejection fraction of 60% and no evidence of significant valvular heart disease  Review of Systems: Positive for: Shortness of breath and edema Negative for: Vision change, hearing change, syncope, dizziness, nausea, vomiting,diarrhea, bloody stool, stomach pain, cough, congestion, diaphoresis, urinary frequency, urinary pain,skin lesions, skin rashes Others previously listed  Objective: Telemetry: Normal sinus rhythm Physical Exam: Blood pressure (!) 150/63, pulse 63, temperature 98.4 F (36.9 C), temperature source Oral, resp. rate 18, height 5\' 1"  (1.549 m), weight 66.9 kg, SpO2 90 %. Body mass index is 27.85 kg/m. General: Well developed, well nourished, in no acute distress. Head: Normocephalic, atraumatic, sclera non-icteric, no xanthomas, nares are without discharge. Neck: No apparent masses Lungs: Normal respirations with few wheezes, no rhonchi, no rales , few basilar crackles   Heart: Regular rate and rhythm, normal S1 S2, no murmur, no rub, no gallop, PMI is normal size and placement, carotid upstroke normal without bruit, jugular venous pressure normal Abdomen: Soft, non-tender, non-distended with normoactive bowel sounds. No hepatosplenomegaly. Abdominal aorta is normal size without bruit Extremities: 2+ edema, no clubbing, no cyanosis, no ulcers, significant bilateral lower  extremity brawny changes Peripheral: 2+ radial, zero+ femoral, zero+ dorsal pedal pulses Neuro: Alert and oriented. Moves all extremities spontaneously. Psych:  Responds to questions appropriately with a normal affect.   Intake/Output Summary (Last 24 hours) at 09/29/2020 0821 Last data filed at 09/29/2020 0500 Gross per 24 hour  Intake 3 ml  Output 6300 ml  Net -6297 ml    Inpatient Medications:  . carvedilol  3.125 mg Oral BID WC  . enoxaparin (LOVENOX) injection  0.5 mg/kg Subcutaneous Q24H  . furosemide  40 mg Intravenous BID  . lisinopril  10 mg Oral Daily  . loratadine  10 mg Oral Daily  . sodium chloride flush  3 mL Intravenous Q12H   Infusions:  . sodium chloride      Labs: Recent Labs    09/28/20 0547 09/29/20 0521  NA 141 142  K 3.6 3.5  CL 100 100  CO2 33* 32  GLUCOSE 110* 121*  BUN 17 16  CREATININE 0.65 0.69  CALCIUM 8.7* 9.0   Recent Labs    09/26/20 2213  AST 22  ALT 17  ALKPHOS 85  BILITOT 1.1  PROT 8.2*  ALBUMIN 3.7   Recent Labs    09/26/20 2213 09/27/20 0350 09/28/20 0547 09/29/20 0521  WBC 10.0   < > 6.4 7.6  NEUTROABS 8.4*  --   --   --   HGB 12.8   < > 11.6* 12.5  HCT 39.4   < > 36.2 38.7  MCV 95.9   < > 95.5 95.6  PLT 166   < > 160 156   < > = values in this interval not displayed.   No results for input(s): CKTOTAL, CKMB, TROPONINI in the last 72 hours. Invalid input(s): POCBNP No results for input(s): HGBA1C in the last 72 hours.   Weights: [employer]  09/27/20 1149 09/28/20 0350 09/29/20 0432  Weight: (!) 156.9 kg (!) 151.9 kg 66.9 kg     Radiology/Studies:  CT Angio Chest PE W and/or Wo Contrast  Result Date: 09/27/2020 CLINICAL DATA:  Elevated D-dimer. EXAM: CT ANGIOGRAPHY CHEST WITH CONTRAST TECHNIQUE: Multidetector CT imaging of the chest was performed using the standard protocol during bolus administration of intravenous contrast. Multiplanar CT image reconstructions and MIPs were obtained to evaluate the  vascular anatomy. CONTRAST:  114mL OMNIPAQUE IOHEXOL 350 MG/ML SOLN COMPARISON:  None. FINDINGS: Cardiovascular: There is moderate severity calcification of the aortic arch. Satisfactory opacification of the pulmonary arteries to the segmental level. No evidence of pulmonary embolism. Normal heart size. No pericardial effusion. Mediastinum/Nodes: There is mild right hilar and AP window lymphadenopathy. Thyroid gland, trachea, and esophagus demonstrate no significant findings. Lungs/Pleura: A 6 mm noncalcified lung nodule versus focal scar seen within the posterior aspect of the left apex. An 8 mm noncalcified lung nodule is seen within the anterolateral aspect of the right upper lobe. A 6 mm posterior right upper lobe noncalcified lung nodule is seen. Adjacent 6 mm noncalcified lung nodules are seen along the posterolateral aspect of the right lower lobe. There is no evidence of acute infiltrate, pleural effusion or pneumothorax. Upper Abdomen: No acute abnormality. Musculoskeletal: No chest wall abnormality. No acute or significant osseous findings. Review of the MIP images confirms the above findings. IMPRESSION: 1. No evidence of pulmonary embolism. 2. Multiple bilateral noncalcified lung nodules. Correlation with follow-up chest CT is recommended in 3-6 months. 3. Aortic atherosclerosis. Aortic Atherosclerosis (ICD10-I70.0). Electronically Signed   By: Virgina Norfolk M.D.   On: 09/27/2020 01:28   DG Chest Portable 1 View  Result Date: 09/26/2020 CLINICAL DATA:  Shortness of breath. EXAM: PORTABLE CHEST 1 VIEW COMPARISON:  March 12, 2014 FINDINGS: There is no evidence of acute infiltrate, pleural effusion or pneumothorax. Mild to moderate severity prominence of the perihilar pulmonary vasculature is seen. The cardiac silhouette is mildly enlarged. The visualized skeletal structures are unremarkable. IMPRESSION: Cardiomegaly with mild to moderate severity pulmonary vascular congestion. Electronically  Signed   By: Virgina Norfolk M.D.   On: 09/26/2020 22:10   ECHOCARDIOGRAM COMPLETE  Result Date: 09/27/2020    ECHOCARDIOGRAM REPORT   Patient Name:   Carmen [name] Date of Exam: 09/27/2020 Medical Rec #:  [id number]       Height:       62.0 in Accession #:    [id number]      Weight:       345.5 lb Date of Birth:  16-Oct-1956       BSA:          2.412 m Patient Age:    35 years        BP:           118/60 mmHg Patient Gender: F               HR:           64 bpm. Exam Location:  [facility] Procedure: 2D Echo, Cardiac Doppler and Color Doppler Indications:     CHF- acute diastolic 073.71  History:         Patient has no prior history of Echocardiogram examinations.                  CHF.  Sonographer:     Sherrie Sport RDCS (AE) Referring Phys:  [id number] Athena Masse Diagnosing Phys: Kathlyn Sacramento MD  Sonographer Comments:  No apical window, no subcostal window and Technically challenging study due to limited acoustic windows. IMPRESSIONS  1. Left ventricular ejection fraction, by estimation, is 55 to 60%. The left ventricle has normal function. Left ventricular endocardial border not optimally defined to evaluate regional wall motion. Left ventricular diastolic function could not be evaluated.  2. Right ventricular systolic function is normal. The right ventricular size is normal.  3. Left atrial size was moderately dilated.  4. The mitral valve is normal in structure. No evidence of mitral valve regurgitation. No evidence of mitral stenosis.  5. The aortic valve is normal in structure. Aortic valve regurgitation is not visualized. Mild to moderate aortic valve sclerosis/calcification is present, without any evidence of aortic stenosis.  6. Very challenging image quality with limited views. FINDINGS  Left Ventricle: Left ventricular ejection fraction, by estimation, is 55 to 60%. The left ventricle has normal function. Left ventricular endocardial border not optimally defined to evaluate regional wall motion. The left  ventricular internal cavity size was normal in size. There is no left ventricular hypertrophy. Left ventricular diastolic function could not be evaluated. Right Ventricle: The right ventricular size is normal. No increase in right ventricular wall thickness. Right ventricular systolic function is normal. Left Atrium: Left atrial size was moderately dilated. Right Atrium: Right atrial size was normal in size. Pericardium: There is no evidence of pericardial effusion. Mitral Valve: The mitral valve is normal in structure. No evidence of mitral valve regurgitation. No evidence of mitral valve stenosis. Tricuspid Valve: The tricuspid valve is normal in structure. Tricuspid valve regurgitation is trivial. No evidence of tricuspid stenosis. Aortic Valve: The aortic valve is normal in structure. Aortic valve regurgitation is not visualized. Mild to moderate aortic valve sclerosis/calcification is present, without any evidence of aortic stenosis. Pulmonic Valve: The pulmonic valve was normal in structure. Pulmonic valve regurgitation is not visualized. No evidence of pulmonic stenosis. Aorta: The aortic root is normal in size and structure. Venous: The inferior vena cava was not well visualized. IAS/Shunts: No atrial level shunt detected by color flow Doppler.  LEFT VENTRICLE PLAX 2D LVIDd:         4.22 cm LVIDs:         2.75 cm LV PW:         1.14 cm LV IVS:        0.99 cm LVOT diam:     2.00 cm LVOT Area:     3.14 cm  LEFT ATRIUM         Index LA diam:    4.50 cm 1.87 cm/m                        PULMONIC VALVE AORTA                 PV Vmax:        0.92 m/s Ao Root diam: 2.40 cm PV Peak grad:   3.4 mmHg                       RVOT Peak grad: 16 mmHg   SHUNTS Systemic Diam: 2.00 cm Kathlyn Sacramento MD Electronically signed by Kathlyn Sacramento MD Signature Date/Time: 09/27/2020/12:38:09 PM    Final      Assessment and Recommendation  64 y.o. female with known hypertension hyperlipidemia and severe chronic lower extremity  brawny changes and lymphedema with acute on chronic diastolic dysfunction congestive heart failure without evidence of myocardial infarction now improved  with intravenous Lasix 1. Okay for continuation of intravenous Lasix for improvements of lower extremity and lymphedema in addition to continuation of treatment for pulmonary edema and acute on chronic diastolic dysfunction congestive heart failure.  Plan for Lasix 40 mg orally each day upon discharge for continued maintenance of fluid balances 2. Will increase lisinopril to 20 mg each day in addition to carvedilol for diastolic dysfunction heart failure and hypertension control.  No increase in carvedilol at this time due to heart rate at 63 bpm 3.  Patient is on chronic home oxygen at 2 L and would continue that for now 4. No further cardiac diagnostics necessary at this time 5.  Okay for discharge to home or outside facility as necessary with outpatient medication management as listed  Signed, Serafina Royals M.D. FACC

## 2020-09-29 NOTE — Plan of Care (Signed)

## 2020-09-29 NOTE — Progress Notes (Addendum)
PROGRESS NOTE    Carmen [name]  ZHY:[id number] DOB: 17-Oct-1956 DOA: 09/26/2020 PCP: Cletis Athens, MD   Chief Complaint  Patient presents with  . Shortness of Breath    Brief Narrative: 64 year old female with history of chronic diastolic heart failure, morbid obesity, chronic respiratory failure on home O2 2 L history of endometrial cancer presenting with several day history of dyspnea on exertion.  She's currently requiring 4 L by [place] and is being treated for [place] heart failure exacerbation.  Assessment & Plan:   Principal Problem:   Acute on chronic diastolic CHF (congestive heart failure) (HCC) Active Problems:   Acute on chronic respiratory failure with hypoxia (HCC)   Morbid obesity with BMI of 60.0-69.9, adult (HCC)   Elevated troponin   Acute CHF (congestive heart failure) (HCC)  Acute on chronic respiratory failure with hypoxia (HCC) -Continues to require more than her baseline, needing 4 L today.  -Secondary to CHF exacerbation -CTA chest negative for acute PE -No evidence of infiltrate on chest x-ray or CTA - Wean oxygen as able, check ambulatory pulse ox - Net negative 11 L  Acute on chronic diastolic CHF (congestive heart failure) (HCC) -Several day history of dyspnea on exertion, lower extremity edema -chest x-ray showing cardiomegaly and pulmonary vascular congestion -IV Lasix 40 mg twice daily, continue lisinopril, Coreg -Echocardiogramwas overall Nicolaos Mitrano difficult study due to body habitus but shows normal LV systolic function. -Cardiologyinput appreciated -> recommends 40 mg PO lasix when ready for discharge, increase lisinopril to 20 mg daily in addition to coreg  Elevated troponin -Due to demand ischemia. No chest pain/MI - cardiology following   Chronic lymphedema -This is likely her biggest challenge. She reports this going on since 2015 but her PCP gave her diagnosis last year She is not using Unna boots or following with any lymphedema clinic as  of now -She may benefit from lymphedema clinic follow-up at discharge - wound care c/s -> appreciate woudn care recs  Morbid obesity with BMI of 60.0-69.9, adult ([place]) -Complicating factor to overall prognosis and care  Body mass index is 63.26 kg/m.  DVT prophylaxis: lovenox Code Status: full  Family Communication: none at bedside - called husband, no answer/no answering service Disposition:   Status is: Inpatient  Remains inpatient appropriate because:Inpatient level of care appropriate due to severity of illness   Dispo: The patient is from: Home              Anticipated d/c is to: Home              Anticipated d/c date is: 2 days              Patient currently is not medically stable to d/c.       Consultants:  cardiology  Procedures:  Echo IMPRESSIONS    1. Left ventricular ejection fraction, by estimation, is 55 to 60%. The  left ventricle has normal function. Left ventricular endocardial border  not optimally defined to evaluate regional wall motion. Left ventricular  diastolic function could not be  evaluated.  2. Right ventricular systolic function is normal. The right ventricular  size is normal.  3. Left atrial size was moderately dilated.  4. The mitral valve is normal in structure. No evidence of mitral valve  regurgitation. No evidence of mitral stenosis.  5. The aortic valve is normal in structure. Aortic valve regurgitation is  not visualized. Mild to moderate aortic valve sclerosis/calcification is  present, without any evidence of aortic stenosis.  6. Very challenging image quality with limited views.   Antimicrobials Anti-infectives (From admission, onward)   None        Subjective: No new complaints Isn't interested in SNF  Objective: Vitals:   09/28/20 1900 09/28/20 2353 09/29/20 0432 09/29/20 0753  BP: (!) 147/61 129/68 (!) 156/72 (!) 150/63  Pulse: 73 69 72 63  Resp: 17 16 18 18   Temp: 99.2 F (37.3 C) 98.8 F  (37.1 C) 98.4 F (36.9 C) 98.4 F (36.9 C)  TempSrc: Oral  Oral Oral  SpO2: 90% 90% 92% 90%  Weight:   66.9 kg   Height:        Intake/Output Summary (Last 24 hours) at 09/29/2020 1221 Last data filed at 09/29/2020 1040 Gross per 24 hour  Intake 240 ml  Output 4350 ml  Net -4110 ml   Filed Weights   09/27/20 1149 09/28/20 0350 09/29/20 0432  Weight: (!) 156.9 kg (!) 151.9 kg 66.9 kg    Examination:  General exam: Appears calm and comfortable  Respiratory system: Clear to auscultation. Respiratory effort normal. Cardiovascular system: S1 & S2 heard, RRR.  Gastrointestinal system: Abdomen is nondistended, soft and nontender. Central nervous system: Alert and oriented. No focal neurological deficits. Extremities: bilateral LE edema, lymphedema Skin: No rashes, lesions or ulcers Psychiatry: Judgement and insight appear normal. Mood & affect appropriate.     Data Reviewed: I have personally reviewed following labs and imaging studies  CBC: Recent Labs  Lab 09/26/20 2213 09/27/20 0350 09/28/20 0547 09/29/20 0521  WBC 10.0 9.7 6.4 7.6  NEUTROABS 8.4*  --   --   --   HGB 12.8 11.9* 11.6* 12.5  HCT 39.4 36.6 36.2 38.7  MCV 95.9 94.3 95.5 95.6  PLT 166 170 160 295    Basic Metabolic Panel: Recent Labs  Lab 09/26/20 2213 09/27/20 0350 09/28/20 0547 09/29/20 0521  NA 140  --  141 142  K 4.5  --  3.6 3.5  CL 101  --  100 100  CO2 28  --  33* 32  GLUCOSE 140*  --  110* 121*  BUN 28*  --  17 16  CREATININE 0.74 0.61 0.65 0.69  CALCIUM 8.9  --  8.7* 9.0    GFR: Estimated Creatinine Clearance: 60.5 mL/min (by C-G formula based on SCr of 0.69 mg/dL).  Liver Function Tests: Recent Labs  Lab 09/26/20 2213  AST 22  ALT 17  ALKPHOS 85  BILITOT 1.1  PROT 8.2*  ALBUMIN 3.7    CBG: No results for input(s): GLUCAP in the last 168 hours.   Recent Results (from the past 240 hour(s))  Respiratory Panel by RT PCR (Flu Rivaldo Hineman&B, Covid) - Nasopharyngeal Swab      Status: None   Collection Time: 09/27/20  3:04 AM   Specimen: Nasopharyngeal Swab  Result Value Ref Range Status   SARS Coronavirus 2 by RT PCR NEGATIVE NEGATIVE Final    Comment: (NOTE) SARS-CoV-2 target nucleic acids are NOT DETECTED.  The SARS-CoV-2 RNA is generally detectable in upper respiratoy specimens during the acute phase of infection. The lowest concentration of SARS-CoV-2 viral copies this assay can detect is 131 copies/mL. Tabbetha Kutscher negative result does not preclude SARS-Cov-2 infection and should not be used as the sole basis for treatment or other patient management decisions. Meranda Dechaine negative result may occur with  improper specimen collection/handling, submission of specimen other than nasopharyngeal swab, presence of viral mutation(s) within the areas targeted by this assay, and inadequate number  of viral copies (<131 copies/mL). Kiaya Haliburton negative result must be combined with clinical observations, patient history, and epidemiological information. The expected result is Negative.  Fact Sheet for Patients:  PinkCheek.be  Fact Sheet for Healthcare Providers:  GravelBags.it  This test is no t yet approved or cleared by the [place] FDA and  has been authorized for detection and/or diagnosis of SARS-CoV-2 by FDA under an Emergency Use Authorization (EUA). This EUA will remain  in effect (meaning this test can be used) for the duration of the COVID-19 declaration under Section 564(b)(1) of the Act, 21 U.S.C. section 360bbb-3(b)(1), unless the authorization is terminated or revoked sooner.     Influenza Erikka Follmer by PCR NEGATIVE NEGATIVE Final   Influenza B by PCR NEGATIVE NEGATIVE Final    Comment: (NOTE) The Xpert Xpress SARS-CoV-2/FLU/RSV assay is intended as an aid in  the diagnosis of influenza from Nasopharyngeal swab specimens and  should not be used as Blakley Michna sole basis for treatment. Nasal washings and  aspirates are  unacceptable for Xpert Xpress SARS-CoV-2/FLU/RSV  testing.  Fact Sheet for Patients: PinkCheek.be  Fact Sheet for Healthcare Providers: GravelBags.it  This test is not yet approved or cleared by the [place] FDA and  has been authorized for detection and/or diagnosis of SARS-CoV-2 by  FDA under an Emergency Use Authorization (EUA). This EUA will remain  in effect (meaning this test can be used) for the duration of the  Covid-19 declaration under Section 564(b)(1) of the Act, 21  U.S.C. section 360bbb-3(b)(1), unless the authorization is  terminated or revoked. Performed at [facility], [address] [place], [place] [address]          Radiology Studies: No results found.      Scheduled Meds: . carvedilol  3.125 mg Oral BID WC  . enoxaparin (LOVENOX) injection  0.5 mg/kg Subcutaneous Q24H  . furosemide  40 mg Intravenous BID  . lisinopril  20 mg Oral Daily  . loratadine  10 mg Oral Daily  . sodium chloride flush  3 mL Intravenous Q12H   Continuous Infusions: . sodium chloride       LOS: 2 days    Time spent: over 30 min    Fayrene Helper, MD [facility]   To contact the attending provider between 7A-7P or the covering provider during after hours 7P-7A, please log into the web site [email or web address] and access using universal [place] password for that web site. If you do not have the password, please call the hospital operator.  09/29/2020, 12:21 PM

## 2020-09-29 NOTE — TOC Progression Note (Signed)
Transition of Care [facility]) - Progression Note    Patient Details  Name: Carmen [name] MRN: [id number] Date of Birth: May 29, 1956  [facility]) CM/SW [place], LCSW Phone Number: 09/29/2020, 11:00 AM  Clinical Narrative:   CSW followed up with patient. Patient reported she talked to her husband and they do not want SNF rehab at discharge. She reported she would like to go home with Home Health services. She reported she is unsure of which agency would be her preference. CSW provided list of agencies for patient to review from [employer]Exp[employer].be.    Expected Discharge Plan: [place] Barriers to Discharge: Continued Medical Work up  Expected Discharge Plan and Services Expected Discharge Plan: [place] arrangements for the past 2 months: Single Family Home                                       Social Determinants of Health (SDOH) Interventions    Readmission Risk Interventions No flowsheet data found.

## 2020-09-30 DIAGNOSIS — I5033 Acute on chronic diastolic (congestive) heart failure: Secondary | ICD-10-CM | POA: Diagnosis not present

## 2020-09-30 LAB — CBC WITH DIFFERENTIAL/PLATELET
Abs Immature Granulocytes: 0.08 10*3/uL — ABNORMAL HIGH (ref 0.00–0.07)
Basophils Absolute: 0 10*3/uL (ref 0.0–0.1)
Basophils Relative: 1 %
Eosinophils Absolute: 0.2 10*3/uL (ref 0.0–0.5)
Eosinophils Relative: 4 %
HCT: 38 % (ref 36.0–46.0)
Hemoglobin: 12 g/dL (ref 12.0–15.0)
Immature Granulocytes: 1 %
Lymphocytes Relative: 14 %
Lymphs Abs: 0.9 10*3/uL (ref 0.7–4.0)
MCH: 30.5 pg (ref 26.0–34.0)
MCHC: 31.6 g/dL (ref 30.0–36.0)
MCV: 96.7 fL (ref 80.0–100.0)
Monocytes Absolute: 0.5 10*3/uL (ref 0.1–1.0)
Monocytes Relative: 8 %
Neutro Abs: 4.5 10*3/uL (ref 1.7–7.7)
Neutrophils Relative %: 72 %
Platelets: 156 10*3/uL (ref 150–400)
RBC: 3.93 MIL/uL (ref 3.87–5.11)
RDW: 14.9 % (ref 11.5–15.5)
WBC: 6.2 10*3/uL (ref 4.0–10.5)
nRBC: 0 % (ref 0.0–0.2)

## 2020-09-30 LAB — COMPREHENSIVE METABOLIC PANEL
ALT: 13 U/L (ref 0–44)
AST: 14 U/L — ABNORMAL LOW (ref 15–41)
Albumin: 3.2 g/dL — ABNORMAL LOW (ref 3.5–5.0)
Alkaline Phosphatase: 63 U/L (ref 38–126)
Anion gap: 8 (ref 5–15)
BUN: 17 mg/dL (ref 8–23)
CO2: 34 mmol/L — ABNORMAL HIGH (ref 22–32)
Calcium: 8.7 mg/dL — ABNORMAL LOW (ref 8.9–10.3)
Chloride: 100 mmol/L (ref 98–111)
Creatinine, Ser: 0.66 mg/dL (ref 0.44–1.00)
GFR, Estimated: >60 mL/min (ref >60)
Glucose, Bld: 111 mg/dL — ABNORMAL HIGH (ref 70–99)
Potassium: 3.6 mmol/L (ref 3.5–5.1)
Sodium: 142 mmol/L (ref 135–145)
Total Bilirubin: 0.8 mg/dL (ref 0.3–1.2)
Total Protein: 7.1 g/dL (ref 6.5–8.1)

## 2020-09-30 LAB — MAGNESIUM: Magnesium: 2.1 mg/dL (ref 1.7–2.4)

## 2020-09-30 LAB — PHOSPHORUS: Phosphorus: 4.3 mg/dL (ref 2.5–4.6)

## 2020-09-30 MED ORDER — FUROSEMIDE 10 MG/ML IJ SOLN
40.0000 mg | Freq: Two times a day (BID) | INTRAMUSCULAR | Status: DC
  Administered 2020-09-30 – 2020-10-02 (×5): 40 mg via INTRAVENOUS
  Filled 2020-09-30 (×5): qty 4

## 2020-09-30 MED ORDER — CARVEDILOL 3.125 MG PO TABS
6.2500 mg | ORAL_TABLET | Freq: Two times a day (BID) | ORAL | Status: DC
  Administered 2020-09-30 – 2020-10-08 (×16): 6.25 mg via ORAL
  Filled 2020-09-30 (×16): qty 2

## 2020-09-30 NOTE — Plan of Care (Signed)

## 2020-09-30 NOTE — Care Management Important Message (Signed)
Important Message  Patient Details  Name: Carmen [name] MRN: [id number] Date of Birth: 05-18-1956   Medicare Important Message Given:  Yes     Juliann Pulse A Dameian Crisman 09/30/2020, 10:33 AM

## 2020-09-30 NOTE — Progress Notes (Signed)
[facility] Encounter Note  Patient: Carmen [name] / Admit Date: 09/26/2020 / Date of Encounter: 09/30/2020, 9:05 AM   Subjective: Patient significantly improved from the oxygenation standpoint with no evidence of PND orthopnea. Patient has had significant of urine output with intravenous Lasix. Patient has not had any change in her glomerular filtration rate at this time over the last several days. Lower extremity edema still significant but appears that this is a chronic lymphedema.  The patient has had his considerable amount of improvement though with intravenous Lasix.. No evidence of myocardial infarction with peak troponin of 40.  Echocardiogram showing normal LV systolic function with ejection fraction of 60% and no evidence of significant valvular heart disease  Review of Systems: Positive for: Shortness of breath and edema Negative for: Vision change, hearing change, syncope, dizziness, nausea, vomiting,diarrhea, bloody stool, stomach pain, cough, congestion, diaphoresis, urinary frequency, urinary pain,skin lesions, skin rashes Others previously listed  Objective: Telemetry: Normal sinus rhythm Physical Exam: Blood pressure (!) 163/59, pulse 78, temperature (!) 97.5 F (36.4 C), temperature source Oral, resp. rate 16, height 5\' 1"  (1.549 m), weight 65.1 kg, SpO2 90 %. Body mass index is 27.11 kg/m. General: Well developed, well nourished, in no acute distress. Head: Normocephalic, atraumatic, sclera non-icteric, no xanthomas, nares are without discharge. Neck: No apparent masses Lungs: Normal respirations with few wheezes, no rhonchi, no rales , few basilar crackles   Heart: Regular rate and rhythm, normal S1 S2, no murmur, no rub, no gallop, PMI is normal size and placement, carotid upstroke normal without bruit, jugular venous pressure normal Abdomen: Soft, non-tender, non-distended with normoactive bowel sounds. No hepatosplenomegaly. Abdominal aorta is  normal size without bruit Extremities: 2+ edema, no clubbing, no cyanosis, no ulcers, significant bilateral lower extremity brawny changes Peripheral: 2+ radial, zero+ femoral, zero+ dorsal pedal pulses Neuro: Alert and oriented. Moves all extremities spontaneously. Psych:  Responds to questions appropriately with a normal affect.   Intake/Output Summary (Last 24 hours) at 09/30/2020 0905 Last data filed at 09/29/2020 1910 Gross per 24 hour  Intake 600 ml  Output 2400 ml  Net -1800 ml    Inpatient Medications:  . carvedilol  3.125 mg Oral BID WC  . enoxaparin (LOVENOX) injection  0.5 mg/kg Subcutaneous Q24H  . furosemide  40 mg Intravenous BID  . lisinopril  20 mg Oral Daily  . loratadine  10 mg Oral Daily  . sodium chloride flush  3 mL Intravenous Q12H   Infusions:  . sodium chloride      Labs: Recent Labs    09/29/20 0521 09/30/20 0559  NA 142 142  K 3.5 3.6  CL 100 100  CO2 32 34*  GLUCOSE 121* 111*  BUN 16 17  CREATININE 0.69 0.66  CALCIUM 9.0 8.7*  MG  --  2.1  PHOS  --  4.3   Recent Labs    09/30/20 0559  AST 14*  ALT 13  ALKPHOS 63  BILITOT 0.8  PROT 7.1  ALBUMIN 3.2*   Recent Labs    09/29/20 0521 09/30/20 0559  WBC 7.6 6.2  NEUTROABS  --  4.5  HGB 12.5 12.0  HCT 38.7 38.0  MCV 95.6 96.7  PLT 156 156   No results for input(s): CKTOTAL, CKMB, TROPONINI in the last 72 hours. Invalid input(s): POCBNP No results for input(s): HGBA1C in the last 72 hours.   Weights: Filed Weights   09/29/20 0432 09/29/20 1600 09/30/20 0430  Weight: 66.9 kg (!) 142.1  kg 65.1 kg     Radiology/Studies:  CT Angio Chest PE W and/or Wo Contrast  Result Date: 09/27/2020 CLINICAL DATA:  Elevated D-dimer. EXAM: CT ANGIOGRAPHY CHEST WITH CONTRAST TECHNIQUE: Multidetector CT imaging of the chest was performed using the standard protocol during bolus administration of intravenous contrast. Multiplanar CT image reconstructions and MIPs were obtained to evaluate the  vascular anatomy. CONTRAST:  165mL OMNIPAQUE IOHEXOL 350 MG/ML SOLN COMPARISON:  None. FINDINGS: Cardiovascular: There is moderate severity calcification of the aortic arch. Satisfactory opacification of the pulmonary arteries to the segmental level. No evidence of pulmonary embolism. Normal heart size. No pericardial effusion. Mediastinum/Nodes: There is mild right hilar and AP window lymphadenopathy. Thyroid gland, trachea, and esophagus demonstrate no significant findings. Lungs/Pleura: A 6 mm noncalcified lung nodule versus focal scar seen within the posterior aspect of the left apex. An 8 mm noncalcified lung nodule is seen within the anterolateral aspect of the right upper lobe. A 6 mm posterior right upper lobe noncalcified lung nodule is seen. Adjacent 6 mm noncalcified lung nodules are seen along the posterolateral aspect of the right lower lobe. There is no evidence of acute infiltrate, pleural effusion or pneumothorax. Upper Abdomen: No acute abnormality. Musculoskeletal: No chest wall abnormality. No acute or significant osseous findings. Review of the MIP images confirms the above findings. IMPRESSION: 1. No evidence of pulmonary embolism. 2. Multiple bilateral noncalcified lung nodules. Correlation with follow-up chest CT is recommended in 3-6 months. 3. Aortic atherosclerosis. Aortic Atherosclerosis (ICD10-I70.0). Electronically Signed   By: Virgina Norfolk M.D.   On: 09/27/2020 01:28   DG Chest Portable 1 View  Result Date: 09/26/2020 CLINICAL DATA:  Shortness of breath. EXAM: PORTABLE CHEST 1 VIEW COMPARISON:  March 12, 2014 FINDINGS: There is no evidence of acute infiltrate, pleural effusion or pneumothorax. Mild to moderate severity prominence of the perihilar pulmonary vasculature is seen. The cardiac silhouette is mildly enlarged. The visualized skeletal structures are unremarkable. IMPRESSION: Cardiomegaly with mild to moderate severity pulmonary vascular congestion. Electronically  Signed   By: Virgina Norfolk M.D.   On: 09/26/2020 22:10   ECHOCARDIOGRAM COMPLETE  Result Date: 09/27/2020    ECHOCARDIOGRAM REPORT   Patient Name:   Carmen [name] Date of Exam: 09/27/2020 Medical Rec #:  [id number]       Height:       62.0 in Accession #:    [id number]      Weight:       345.5 lb Date of Birth:  June 11, 1956       BSA:          2.412 m Patient Age:    44 years        BP:           118/60 mmHg Patient Gender: F               HR:           64 bpm. Exam Location:  [facility] Procedure: 2D Echo, Cardiac Doppler and Color Doppler Indications:     CHF- acute diastolic 062.69  History:         Patient has no prior history of Echocardiogram examinations.                  CHF.  Sonographer:     Sherrie Sport RDCS (AE) Referring Phys:  [id number] Athena Masse Diagnosing Phys: Kathlyn Sacramento MD  Sonographer Comments: No apical window, no subcostal window and Technically challenging study due to limited  acoustic windows. IMPRESSIONS  1. Left ventricular ejection fraction, by estimation, is 55 to 60%. The left ventricle has normal function. Left ventricular endocardial border not optimally defined to evaluate regional wall motion. Left ventricular diastolic function could not be evaluated.  2. Right ventricular systolic function is normal. The right ventricular size is normal.  3. Left atrial size was moderately dilated.  4. The mitral valve is normal in structure. No evidence of mitral valve regurgitation. No evidence of mitral stenosis.  5. The aortic valve is normal in structure. Aortic valve regurgitation is not visualized. Mild to moderate aortic valve sclerosis/calcification is present, without any evidence of aortic stenosis.  6. Very challenging image quality with limited views. FINDINGS  Left Ventricle: Left ventricular ejection fraction, by estimation, is 55 to 60%. The left ventricle has normal function. Left ventricular endocardial border not optimally defined to evaluate regional wall motion. The left  ventricular internal cavity size was normal in size. There is no left ventricular hypertrophy. Left ventricular diastolic function could not be evaluated. Right Ventricle: The right ventricular size is normal. No increase in right ventricular wall thickness. Right ventricular systolic function is normal. Left Atrium: Left atrial size was moderately dilated. Right Atrium: Right atrial size was normal in size. Pericardium: There is no evidence of pericardial effusion. Mitral Valve: The mitral valve is normal in structure. No evidence of mitral valve regurgitation. No evidence of mitral valve stenosis. Tricuspid Valve: The tricuspid valve is normal in structure. Tricuspid valve regurgitation is trivial. No evidence of tricuspid stenosis. Aortic Valve: The aortic valve is normal in structure. Aortic valve regurgitation is not visualized. Mild to moderate aortic valve sclerosis/calcification is present, without any evidence of aortic stenosis. Pulmonic Valve: The pulmonic valve was normal in structure. Pulmonic valve regurgitation is not visualized. No evidence of pulmonic stenosis. Aorta: The aortic root is normal in size and structure. Venous: The inferior vena cava was not well visualized. IAS/Shunts: No atrial level shunt detected by color flow Doppler.  LEFT VENTRICLE PLAX 2D LVIDd:         4.22 cm LVIDs:         2.75 cm LV PW:         1.14 cm LV IVS:        0.99 cm LVOT diam:     2.00 cm LVOT Area:     3.14 cm  LEFT ATRIUM         Index LA diam:    4.50 cm 1.87 cm/m                        PULMONIC VALVE AORTA                 PV Vmax:        0.92 m/s Ao Root diam: 2.40 cm PV Peak grad:   3.4 mmHg                       RVOT Peak grad: 16 mmHg   SHUNTS Systemic Diam: 2.00 cm Kathlyn Sacramento MD Electronically signed by Kathlyn Sacramento MD Signature Date/Time: 09/27/2020/12:38:09 PM    Final      Assessment and Recommendation  64 y.o. female with known hypertension hyperlipidemia and severe chronic lower extremity  brawny changes and lymphedema with acute on chronic diastolic dysfunction congestive heart failure without evidence of myocardial infarction now improved with intravenous Lasix 1. Okay for continuation of intravenous Lasix for possibly 1  more day for improvements of lower extremity and lymphedema in addition to continuation of treatment for pulmonary edema and acute on chronic diastolic dysfunction congestive heart failure.  Plan for Lasix 40 mg orally each day upon discharge for continued maintenance of fluid balances 2.  We will plan to continue lisinopril at 20 mg each day but increase carvedilol to 6.25 mg  3.  Patient is on chronic home oxygen at 2 L and would continue that for now 4. No further cardiac diagnostics necessary at this time 5.  Okay for discharge to home or outside facility as necessary with outpatient medication management as listed  Signed, Serafina Royals M.D. FACC

## 2020-09-30 NOTE — TOC Progression Note (Signed)
Transition of Care [facility]) - Progression Note    Patient Details  Name: Carmen [name] MRN: [id number] Date of Birth: 06/02/1956  Transition of Care [facility]) CM/SW [place], LCSW Phone Number: 09/30/2020, 10:42 AM  Clinical Narrative:   Spoke to patient. She reviewed the [facility] List and was agreeable to referral being made to [facility]. Referral made to [facility] for [place] services.  Discussed PT recommending a wheelchair for home use. Patient agreeable to wheelchair being ordered. Patient also requested a 3in1. Patient said she has access to a RW at home, but it was her [name] and is old. She asked if insurance would cover a new RW. CSW made referral to [employer] DME Rep Mardene Celeste who will follow up on DME needs. She is aware patient needs w/c and 3in1 and would like a RW as well if insurance will cover both.     Expected Discharge Plan: [place] Barriers to Discharge: Continued Medical Work up  Expected Discharge Plan and Services Expected Discharge Plan: [place] arrangements for the past 2 months: Single Family Home                                       Social Determinants of Health (SDOH) Interventions    Readmission Risk Interventions No flowsheet data found.

## 2020-09-30 NOTE — Progress Notes (Signed)
PROGRESS NOTE    Carmen [name]  IPJ:[id number] DOB: 31-Oct-1956 DOA: 09/26/2020 PCP: Cletis Athens, MD   Chief Complaint  Patient presents with  . Shortness of Breath    Brief Narrative: 64 year old female with history of chronic diastolic heart failure, morbid obesity, chronic respiratory failure on home O2 2 L history of endometrial cancer presenting with several day history of dyspnea on exertion.  She's currently requiring 4 L by [place] and is being treated for a heart failure exacerbation.  Assessment & Plan:   Principal Problem:   Acute on chronic diastolic CHF (congestive heart failure) (HCC) Active Problems:   Acute on chronic respiratory failure with hypoxia (HCC)   Morbid obesity with BMI of 60.0-69.9, adult (HCC)   Elevated troponin   Acute CHF (congestive heart failure) (HCC)  Acute on chronic respiratory failure with hypoxia (HCC) Chronic hypoxic respiratory failure on 2L at baseline -needed 4L on presentation, now back down to 2L.  -Secondary to CHF exacerbation -CTA chest negative for acute PE -No evidence of infiltrate on chest x-ray or CTA PLAN: --continue aggressive diuresis with IV lasix 40 mg BID  Acute on chronic diastolic CHF (congestive heart failure) (HCC) -Several day history of dyspnea on exertion, lower extremity edema -chest x-ray showing cardiomegaly and pulmonary vascular congestion -Echocardiogramwas overall a difficult study due to body habitus but shows normal LV systolic function. PLAN: --continue aggressive diuresis with IV lasix 40 mg BID --Strict I/O -continue lisinopril, Coreg -Cardiologyinput appreciated -> recommends 40 mg PO lasix when ready for discharge, increase lisinopril to 20 mg daily in addition to coreg  Elevated troponin -Due to demand ischemia. No chest pain/MI - cardiology following   Chronic lymphedema -This is likely her biggest challenge. She reports this going on since 2015 but her PCP gave her diagnosis  last year She is not using [employer] boots or following with any lymphedema clinic as of now PLAN: --outpatient referral to lymphedema clinic --at high risk of cellulitis, wound care c/s -> appreciate woudn care recs  Morbid obesity with BMI of 60.0-69.9, adult ([place]) -Complicating factor to overall prognosis and care Body mass index is 63.26 kg/m. --weight loss encouraged   DVT prophylaxis: lovenox Code Status: full  Family Communication:  Disposition:   Status is: Inpatient  Remains inpatient appropriate because:Inpatient level of care appropriate due to severity of illness   Dispo: The patient is from: Home              Anticipated d/c is to: Home (pt declined SNF)              Anticipated d/c date is: 2 days              Patient currently is not medically stable to d/c.  Severe volume overload, need aggressive diuresis with IV lasix     Consultants:  cardiology  Procedures:  Echo IMPRESSIONS    1. Left ventricular ejection fraction, by estimation, is 55 to 60%. The  left ventricle has normal function. Left ventricular endocardial border  not optimally defined to evaluate regional wall motion. Left ventricular  diastolic function could not be  evaluated.  2. Right ventricular systolic function is normal. The right ventricular  size is normal.  3. Left atrial size was moderately dilated.  4. The mitral valve is normal in structure. No evidence of mitral valve  regurgitation. No evidence of mitral stenosis.  5. The aortic valve is normal in structure. Aortic valve regurgitation is  not visualized. Mild  to moderate aortic valve sclerosis/calcification is  present, without any evidence of aortic stenosis.  6. Very challenging image quality with limited views.   Antimicrobials Anti-infectives (From admission, onward)   None        Subjective: Having large amount of urine output.  Dyspnea better.     Objective: Vitals:   09/30/20 0430 09/30/20 0600  09/30/20 0753 09/30/20 1139  BP: (!) 145/64  (!) 163/59 (!) 150/68  Pulse: 69  78 71  Resp: 16  16 17   Temp: 98.7 F (37.1 C)  (!) 97.5 F (36.4 C) 98.2 F (36.8 C)  TempSrc:   Oral   SpO2: 91% 91% 90% (!) 87%  Weight: 65.1 kg     Height:        Intake/Output Summary (Last 24 hours) at 09/30/2020 1552 Last data filed at 09/30/2020 1418 Gross per 24 hour  Intake 600 ml  Output 1200 ml  Net -600 ml   Filed Weights   09/29/20 0432 09/29/20 1600 09/30/20 0430  Weight: 66.9 kg (!) 142.1 kg 65.1 kg    Examination:  Constitutional: NAD, AAOx3 HEENT: conjunctivae and lids normal, EOMI CV: No cyanosis.   RESP: normal respiratory effort, on 2L Extremities: BLE severe lymphedema, with rough nodular skin and mild erythema SKIN: warm, dry  Neuro: II - XII grossly intact.   Psych: Normal mood and affect.  Appropriate judgement and reason   Data Reviewed: I have personally reviewed following labs and imaging studies  CBC: Recent Labs  Lab 09/26/20 2213 09/27/20 0350 09/28/20 0547 09/29/20 0521 09/30/20 0559  WBC 10.0 9.7 6.4 7.6 6.2  NEUTROABS 8.4*  --   --   --  4.5  HGB 12.8 11.9* 11.6* 12.5 12.0  HCT 39.4 36.6 36.2 38.7 38.0  MCV 95.9 94.3 95.5 95.6 96.7  PLT 166 170 160 156 270    Basic Metabolic Panel: Recent Labs  Lab 09/26/20 2213 09/27/20 0350 09/28/20 0547 09/29/20 0521 09/30/20 0559  NA 140  --  141 142 142  K 4.5  --  3.6 3.5 3.6  CL 101  --  100 100 100  CO2 28  --  33* 32 34*  GLUCOSE 140*  --  110* 121* 111*  BUN 28*  --  17 16 17   CREATININE 0.74 0.61 0.65 0.69 0.66  CALCIUM 8.9  --  8.7* 9.0 8.7*  MG  --   --   --   --  2.1  PHOS  --   --   --   --  4.3    GFR: Estimated Creatinine Clearance: 59.7 mL/min (by C-G formula based on SCr of 0.66 mg/dL).  Liver Function Tests: Recent Labs  Lab 09/26/20 2213 09/30/20 0559  AST 22 14*  ALT 17 13  ALKPHOS 85 63  BILITOT 1.1 0.8  PROT 8.2* 7.1  ALBUMIN 3.7 3.2*    CBG: No results for  input(s): GLUCAP in the last 168 hours.   Recent Results (from the past 240 hour(s))  Respiratory Panel by RT PCR (Flu A&B, Covid) - Nasopharyngeal Swab     Status: None   Collection Time: 09/27/20  3:04 AM   Specimen: Nasopharyngeal Swab  Result Value Ref Range Status   SARS Coronavirus 2 by RT PCR NEGATIVE NEGATIVE Final    Comment: (NOTE) SARS-CoV-2 target nucleic acids are NOT DETECTED.  The SARS-CoV-2 RNA is generally detectable in upper respiratoy specimens during the acute phase of infection. The lowest concentration of SARS-CoV-2 viral  copies this assay can detect is 131 copies/mL. A negative result does not preclude SARS-Cov-2 infection and should not be used as the sole basis for treatment or other patient management decisions. A negative result may occur with  improper specimen collection/handling, submission of specimen other than nasopharyngeal swab, presence of viral mutation(s) within the areas targeted by this assay, and inadequate number of viral copies (<131 copies/mL). A negative result must be combined with clinical observations, patient history, and epidemiological information. The expected result is Negative.  Fact Sheet for Patients:  PinkCheek.be  Fact Sheet for Healthcare Providers:  GravelBags.it  This test is no t yet approved or cleared by the [place] FDA and  has been authorized for detection and/or diagnosis of SARS-CoV-2 by FDA under an Emergency Use Authorization (EUA). This EUA will remain  in effect (meaning this test can be used) for the duration of the COVID-19 declaration under Section 564(b)(1) of the Act, 21 U.S.C. section 360bbb-3(b)(1), unless the authorization is terminated or revoked sooner.     Influenza A by PCR NEGATIVE NEGATIVE Final   Influenza B by PCR NEGATIVE NEGATIVE Final    Comment: (NOTE) The Xpert Xpress SARS-CoV-2/FLU/RSV assay is intended as an aid in    the diagnosis of influenza from Nasopharyngeal swab specimens and  should not be used as a sole basis for treatment. Nasal washings and  aspirates are unacceptable for Xpert Xpress SARS-CoV-2/FLU/RSV  testing.  Fact Sheet for Patients: PinkCheek.be  Fact Sheet for Healthcare Providers: GravelBags.it  This test is not yet approved or cleared by the [place] FDA and  has been authorized for detection and/or diagnosis of SARS-CoV-2 by  FDA under an Emergency Use Authorization (EUA). This EUA will remain  in effect (meaning this test can be used) for the duration of the  Covid-19 declaration under Section 564(b)(1) of the Act, 21  U.S.C. section 360bbb-3(b)(1), unless the authorization is  terminated or revoked. Performed at [facility], [address] [place], [place] [address]          Radiology Studies: No results found.      Scheduled Meds: . carvedilol  6.25 mg Oral BID WC  . enoxaparin (LOVENOX) injection  0.5 mg/kg Subcutaneous Q24H  . furosemide  40 mg Intravenous BID  . lisinopril  20 mg Oral Daily  . loratadine  10 mg Oral Daily  . sodium chloride flush  3 mL Intravenous Q12H   Continuous Infusions: . sodium chloride       LOS: 3 days     Enzo Bi, MD [facility]   To contact the attending provider between 7A-7P or the covering provider during after hours 7P-7A, please log into the web site [email or web address] and access using universal [place] password for that web site. If you do not have the password, please call the hospital operator.  09/30/2020, 3:52 PM

## 2020-10-01 ENCOUNTER — Ambulatory Visit: Payer: Self-pay | Admitting: Family Medicine

## 2020-10-01 DIAGNOSIS — I5033 Acute on chronic diastolic (congestive) heart failure: Secondary | ICD-10-CM | POA: Diagnosis not present

## 2020-10-01 LAB — BASIC METABOLIC PANEL
Anion gap: 9 (ref 5–15)
BUN: 19 mg/dL (ref 8–23)
CO2: 32 mmol/L (ref 22–32)
Calcium: 9.1 mg/dL (ref 8.9–10.3)
Chloride: 100 mmol/L (ref 98–111)
Creatinine, Ser: 0.56 mg/dL (ref 0.44–1.00)
GFR, Estimated: >60 mL/min (ref >60)
Glucose, Bld: 114 mg/dL — ABNORMAL HIGH (ref 70–99)
Potassium: 3.4 mmol/L — ABNORMAL LOW (ref 3.5–5.1)
Sodium: 141 mmol/L (ref 135–145)

## 2020-10-01 LAB — CBC
HCT: 38.3 % (ref 36.0–46.0)
Hemoglobin: 12.4 g/dL (ref 12.0–15.0)
MCH: 30.2 pg (ref 26.0–34.0)
MCHC: 32.4 g/dL (ref 30.0–36.0)
MCV: 93.4 fL (ref 80.0–100.0)
Platelets: 170 10*3/uL (ref 150–400)
RBC: 4.1 MIL/uL (ref 3.87–5.11)
RDW: 14.9 % (ref 11.5–15.5)
WBC: 6.7 10*3/uL (ref 4.0–10.5)
nRBC: 0 % (ref 0.0–0.2)

## 2020-10-01 LAB — MAGNESIUM: Magnesium: 2.4 mg/dL (ref 1.7–2.4)

## 2020-10-01 MED ORDER — POTASSIUM CHLORIDE CRYS ER 20 MEQ PO TBCR
40.0000 meq | EXTENDED_RELEASE_TABLET | Freq: Once | ORAL | Status: AC
  Administered 2020-10-01: 09:00:00 40 meq via ORAL
  Filled 2020-10-01: qty 2

## 2020-10-01 NOTE — TOC Progression Note (Signed)
Transition of Care [facility]) - Progression Note    Patient Details  Name: Carmen [name] MRN: [id number] Date of Birth: Dec 13, 1955  Transition of Care [facility]) CM/SW [place], LCSW Phone Number: 10/01/2020, 10:41 AM  Clinical Narrative:   Updated by [employer] DME Representative Mardene Celeste that no wheelchair order is in. CSW asked Dr. Florene Glen to put in order.    Expected Discharge Plan: [place] Barriers to Discharge: Continued Medical Work up  Expected Discharge Plan and Services Expected Discharge Plan: [place] arrangements for the past 2 months: Single Family Home                                       Social Determinants of Health (SDOH) Interventions    Readmission Risk Interventions No flowsheet data found.

## 2020-10-01 NOTE — Progress Notes (Signed)
Occupational Therapy Treatment Patient Details Name: BRALEIGH MASSOUD MRN: [id number] DOB: 1956/09/02 Today's Date: 10/01/2020    History of present illness Patient is a 64 year old female who presents to ED with increased SOB and dyspnea.  Imaging negative for PE. PMH of chronic diastolic HF, morbid oesity, chronic lymphedema, chronic respiratory failure on home O2 with 2L, endometrial cancer, chronic lymphedema.   OT comments  Ms Mathurin was seen for OT treatment on this date. Upon arrival to room pt awake reclined in bed reporting need to have BM. Pt continues to report anxiousness c mobility however willing to attempt mobility for practice upon return home. Pt requires MIN A for bed mobility - assist for torso. Pt tolerated ~20 mins sitting EOB for tooth brushing, medication from RN in room, and UBD - required SBA for dynamic sitting balance. MIN A + RW for BSC t/f - assist for lift off and RW stabilization - pt uses momentum to stand. MAX A perihygiene in standing - pt had very large BM. MIN A + RW for ~ 21f ambulating to recliner, pt then used BLE to self-propel recliner with heel walking ~10 ft in room. Pt left reclined in chair c all needs met, call bell in reach, RN aware. Pt making good progress toward goals. Pt continues to benefit from skilled OT services to maximize return to PLOF and minimize risk of future falls, injury, caregiver burden, and readmission. Will continue to follow POC. Discharge recommendation remains appropriate.    Follow Up Recommendations  SNF    Equipment Recommendations  3 in 1 bedside commode;[facility] (comment[facility]    Recommendations for Other Services      Precautions / Restrictions Precautions Precautions: Fall Restrictions Weight Bearing Restrictions: No Other Position/Activity Restrictions: bilateral LE edema/lymphadema       Mobility Bed Mobility Overal bed mobility: Needs Assistance Bed Mobility: Supine to Sit     Supine to sit: Min  assist;HOB elevated     General bed mobility comments: Increased time + bed rail use, MIN A for torso   Transfers Overall transfer level: Needs assistance Equipment used: Rolling walker (2 wheeled) Transfers: Sit to/from SOmnicareSit to Stand: Min assist;From elevated surface Stand pivot transfers: Min assist       General transfer comment: MIN A to stabilize RW - pt uses momentum to stand     Balance Overall balance assessment: Needs assistance Sitting-balance support: Feet supported;Single extremity supported Sitting balance-Leahy Scale: Good     Standing balance support: Bilateral upper extremity supported Standing balance-Leahy Scale: Fair        ADL either performed or assessed with clinical judgement   ADL Overall ADL's : Needs assistance/impaired       General ADL Comments: MAX A for LBD at bed level. MIN A + RW for BSC t/f, MAX A for perihygiene in standing. SBA toothbrushing seated EOB.                Cognition Arousal/Alertness: Awake/alert Behavior During Therapy: WFL for tasks assessed/performed Overall Cognitive Status: Within Functional Limits for tasks assessed         Exercises Exercises: Other exercises Other Exercises Other Exercises: Pt educated re: falls prevention, ECS, DME recs, home/routines modifications, safe RW technique Other Exercises: LBD, toileting, tooth brushing, self-drinking, sup>sit, sit<>stand x2, sitting/standing balance/toelrance, ~5 ft mobility      General Comments Stable t/o on 4L [place]     Pertinent Vitals/ Pain  Pain Assessment: Faces Faces Pain Scale: Hurts little more Pain Location: post knee sores from sheering forces Pain Descriptors / Indicators: Aching Pain Intervention(s): Limited activity within patient's tolerance;Repositioned         Frequency  Min 1X/week        Progress Toward Goals  OT Goals(current goals can now be found in the care plan section)  Progress towards OT  goals: Progressing toward goals  Acute Rehab OT Goals Patient Stated Goal: to be able to move OT Goal Formulation: With patient Time For Goal Achievement: 10/12/20 Potential to Achieve Goals: Good ADL Goals Pt Will Perform Grooming: with supervision;sitting Pt Will Perform Lower Body Dressing: with mod assist;sitting/lateral leans;with caregiver independent in assisting Pt Will Transfer to Toilet: with min assist;ambulating;regular height toilet (c LRAD PRN)  Plan Discharge plan remains appropriate;Frequency remains appropriate       AM-PAC OT "6 Clicks" Daily Activity     Outcome Measure   Help from another person eating meals?: None Help from another person taking care of personal grooming?: A Little Help from another person toileting, which includes using toliet, bedpan, or urinal?: A Lot Help from another person bathing (including washing, rinsing, drying)?: A Lot Help from another person to put on and taking off regular upper body clothing?: A Little Help from another person to put on and taking off regular lower body clothing?: A Lot 6 Click Score: 16    End of Session Equipment Utilized During Treatment: Oxygen  OT Visit Diagnosis: Other abnormalities of gait and mobility (R26.89);Muscle weakness (generalized) (M62.81)   Activity Tolerance Patient tolerated treatment well   Patient Left in chair;with call bell/phone within reach;with chair alarm set   Nurse Communication Mobility status (pt up in chair, had BM)        Time: 0901-1006 OT Time Calculation (min): 65 min  Charges: OT General Charges $OT Visit: 1 Visit OT Treatments $Self Care/Home Management : 23-37 mins $Therapeutic Activity: 23-37 mins  Dessie Coma, M.S. OTR/L  10/01/20, 11:01 AM  ascom [phone]

## 2020-10-01 NOTE — Progress Notes (Addendum)
PROGRESS NOTE    Carmen [name]  FWY:[id number] DOB: 08/20/1956 DOA: 09/26/2020 PCP: Cletis Athens, MD   Chief Complaint  Patient presents with  . Shortness of Breath    Brief Narrative: 64 year old female with history of chronic diastolic heart failure, morbid obesity, chronic respiratory failure on home O2 2 L history of endometrial cancer presenting with several day history of dyspnea on exertion.  She's currently requiring 4 L by [place] and is being treated for Dietrick Barris heart failure exacerbation.  Assessment & Plan:   Principal Problem:   Acute on chronic diastolic CHF (congestive heart failure) (HCC) Active Problems:   Acute on chronic respiratory failure with hypoxia (HCC)   Morbid obesity with BMI of 60.0-69.9, adult (HCC)   Elevated troponin   Acute CHF (congestive heart failure) (HCC)  Acute on chronic respiratory failure with hypoxia (HCC) -Continues to require more than her baseline, needing 2-4 L today (on 2 L at baseline).  -Secondary to CHF exacerbation -CTA chest negative for acute PE -No evidence of infiltrate on chest x-ray or CTA - Wean oxygen as able, check ambulatory pulse ox - Net negative 11.7 L  Acute on chronic diastolic CHF (congestive heart failure) (HCC) -Several day history of dyspnea on exertion, lower extremity edema -chest x-ray showing cardiomegaly and pulmonary vascular congestion - continue IV Lasix 40 mg twice daily, continue lisinopril, Coreg -Echocardiogramwas overall Naome Brigandi difficult study due to body habitus but shows normal LV systolic function. -Cardiologyinput appreciated -> recommends 40 mg PO lasix when ready for discharge, increase lisinopril to 20 mg daily in addition to coreg  Elevated troponin -Due to demand ischemia. No chest pain/MI - cardiology following   Chronic lymphedema -This is likely her biggest challenge. She reports this going on since 2015 but her PCP gave her diagnosis last year She is not using Unna boots or  following with any lymphedema clinic as of now -She may benefit from lymphedema clinic follow-up at discharge - wound care c/s -> appreciate woudn care recs  Morbid obesity with BMI of 60.0-69.9, adult ([place]) -Complicating factor to overall prognosis and care  Body mass index is 63.26 kg/m.  Discharge to home with [facility] vs SNF -> SNF for rehab safest d/c option, she's not interested in this at this time, I've discussed to discuss with her husband about this as I think she needs more help at home than 1 person can safely provide.  DVT prophylaxis: lovenox Code Status: full  Family Communication: none at bedside - called husband, no answer 11/5 Disposition:   Status is: Inpatient  Remains inpatient appropriate because:Inpatient level of care appropriate due to severity of illness   Dispo: The patient is from: Home              Anticipated d/c is to: Home              Anticipated d/c date is: 2 days              Patient currently is not medically stable to d/c.       Consultants:  cardiology  Procedures:  Echo IMPRESSIONS    1. Left ventricular ejection fraction, by estimation, is 55 to 60%. The  left ventricle has normal function. Left ventricular endocardial border  not optimally defined to evaluate regional wall motion. Left ventricular  diastolic function could not be  evaluated.  2. Right ventricular systolic function is normal. The right ventricular  size is normal.  3. Left atrial size was moderately  dilated.  4. The mitral valve is normal in structure. No evidence of mitral valve  regurgitation. No evidence of mitral stenosis.  5. The aortic valve is normal in structure. Aortic valve regurgitation is  not visualized. Mild to moderate aortic valve sclerosis/calcification is  present, without any evidence of aortic stenosis.  6. Very challenging image quality with limited views.   Antimicrobials Anti-infectives (From admission, onward)   None         Subjective: No new complaints today Still not interseted in SNF Discussed importance of considering this for safe d/c  Objective: Vitals:   10/01/20 0117 10/01/20 0406 10/01/20 0500 10/01/20 1538  BP: 136/66 (!) 130/59  135/66  Pulse: 68 67  67  Resp: 20   16  Temp: 98.4 F (36.9 C) 98.1 F (36.7 C)    TempSrc: Oral Oral    SpO2: (!) 89% (!) 88%  91%  Weight:   (!) 142.3 kg   Height:        Intake/Output Summary (Last 24 hours) at 10/01/2020 1658 Last data filed at 10/01/2020 1500 Gross per 24 hour  Intake 360 ml  Output 1300 ml  Net -940 ml   Filed Weights   09/29/20 1600 09/30/20 0430 10/01/20 0500  Weight: (!) 142.1 kg 65.1 kg (!) 142.3 kg    Examination:  General: No acute distress. Cardiovascular: Heart sounds show Jerrett Baldinger regular rate, and rhythm.  Lungs: Clear to auscultation bilaterally Abdomen: Soft, nontender, nondistended Neurological: Alert and oriented 3. Moves all extremities 4. Cranial nerves II through XII grossly intact. Skin: Warm and dry. No rashes or lesions. Extremities: bilateral LE edema   Data Reviewed: I have personally reviewed following labs and imaging studies  CBC: Recent Labs  Lab 09/26/20 2213 09/26/20 2213 09/27/20 0350 09/28/20 0547 09/29/20 0521 09/30/20 0559 10/01/20 0504  WBC 10.0   < > 9.7 6.4 7.6 6.2 6.7  NEUTROABS 8.4*  --   --   --   --  4.5  --   HGB 12.8   < > 11.9* 11.6* 12.5 12.0 12.4  HCT 39.4   < > 36.6 36.2 38.7 38.0 38.3  MCV 95.9   < > 94.3 95.5 95.6 96.7 93.4  PLT 166   < > 170 160 156 156 170   < > = values in this interval not displayed.    Basic Metabolic Panel: Recent Labs  Lab 09/26/20 2213 09/26/20 2213 09/27/20 0350 09/28/20 0547 09/29/20 0521 09/30/20 0559 10/01/20 0504  NA 140  --   --  141 142 142 141  K 4.5  --   --  3.6 3.5 3.6 3.4*  CL 101  --   --  100 100 100 100  CO2 28  --   --  33* 32 34* 32  GLUCOSE 140*  --   --  110* 121* 111* 114*  BUN 28*  --   --  17 16 17 19    CREATININE 0.74   < > 0.61 0.65 0.69 0.66 0.56  CALCIUM 8.9  --   --  8.7* 9.0 8.7* 9.1  MG  --   --   --   --   --  2.1 2.4  PHOS  --   --   --   --   --  4.3  --    < > = values in this interval not displayed.    GFR: Estimated Creatinine Clearance: 93.5 mL/min (by C-G formula based on SCr of 0.56  mg/dL).  Liver Function Tests: Recent Labs  Lab 09/26/20 2213 09/30/20 0559  AST 22 14*  ALT 17 13  ALKPHOS 85 63  BILITOT 1.1 0.8  PROT 8.2* 7.1  ALBUMIN 3.7 3.2*    CBG: No results for input(s): GLUCAP in the last 168 hours.   Recent Results (from the past 240 hour(s))  Respiratory Panel by RT PCR (Flu Jenne Sellinger&B, Covid) - Nasopharyngeal Swab     Status: None   Collection Time: 09/27/20  3:04 AM   Specimen: Nasopharyngeal Swab  Result Value Ref Range Status   SARS Coronavirus 2 by RT PCR NEGATIVE NEGATIVE Final    Comment: (NOTE) SARS-CoV-2 target nucleic acids are NOT DETECTED.  The SARS-CoV-2 RNA is generally detectable in upper respiratoy specimens during the acute phase of infection. The lowest concentration of SARS-CoV-2 viral copies this assay can detect is 131 copies/mL. Renatta Shrieves negative result does not preclude SARS-Cov-2 infection and should not be used as the sole basis for treatment or other patient management decisions. Lennis Korb negative result may occur with  improper specimen collection/handling, submission of specimen other than nasopharyngeal swab, presence of viral mutation(s) within the areas targeted by this assay, and inadequate number of viral copies (<131 copies/mL). Nyazia Canevari negative result must be combined with clinical observations, patient history, and epidemiological information. The expected result is Negative.  Fact Sheet for Patients:  PinkCheek.be  Fact Sheet for Healthcare Providers:  GravelBags.it  This test is no t yet approved or cleared by the [place] FDA and  has been authorized for  detection and/or diagnosis of SARS-CoV-2 by FDA under an Emergency Use Authorization (EUA). This EUA will remain  in effect (meaning this test can be used) for the duration of the COVID-19 declaration under Section 564(b)(1) of the Act, 21 U.S.C. section 360bbb-3(b)(1), unless the authorization is terminated or revoked sooner.     Influenza Izzac Rockett by PCR NEGATIVE NEGATIVE Final   Influenza B by PCR NEGATIVE NEGATIVE Final    Comment: (NOTE) The Xpert Xpress SARS-CoV-2/FLU/RSV assay is intended as an aid in  the diagnosis of influenza from Nasopharyngeal swab specimens and  should not be used as Verdell Dykman sole basis for treatment. Nasal washings and  aspirates are unacceptable for Xpert Xpress SARS-CoV-2/FLU/RSV  testing.  Fact Sheet for Patients: PinkCheek.be  Fact Sheet for Healthcare Providers: GravelBags.it  This test is not yet approved or cleared by the [place] FDA and  has been authorized for detection and/or diagnosis of SARS-CoV-2 by  FDA under an Emergency Use Authorization (EUA). This EUA will remain  in effect (meaning this test can be used) for the duration of the  Covid-19 declaration under Section 564(b)(1) of the Act, 21  U.S.C. section 360bbb-3(b)(1), unless the authorization is  terminated or revoked. Performed at [facility], [address], [place], [place] [address]          Radiology Studies: No results found.      Scheduled Meds: . carvedilol  6.25 mg Oral BID WC  . enoxaparin (LOVENOX) injection  0.5 mg/kg Subcutaneous Q24H  . furosemide  40 mg Intravenous BID  . lisinopril  20 mg Oral Daily  . loratadine  10 mg Oral Daily  . sodium chloride flush  3 mL Intravenous Q12H   Continuous Infusions: . sodium chloride       LOS: 4 days    Time spent: over 30 min    Fayrene Helper, MD [facility]   To contact the attending provider between  7A-7P or the covering  provider during after hours 7P-7A, please log into the web site [email or web address] and access using universal [place] password for that web site. If you do not have the password, please call the hospital operator.  10/01/2020, 4:58 PM

## 2020-10-02 ENCOUNTER — Inpatient Hospital Stay: Payer: Medicare HMO

## 2020-10-02 DIAGNOSIS — I5033 Acute on chronic diastolic (congestive) heart failure: Secondary | ICD-10-CM | POA: Diagnosis not present

## 2020-10-02 LAB — CBC WITH DIFFERENTIAL/PLATELET
Abs Immature Granulocytes: 0.02 10*3/uL (ref 0.00–0.07)
Basophils Absolute: 0 10*3/uL (ref 0.0–0.1)
Basophils Relative: 0 %
Eosinophils Absolute: 0.3 10*3/uL (ref 0.0–0.5)
Eosinophils Relative: 5 %
HCT: 39.8 % (ref 36.0–46.0)
Hemoglobin: 12.6 g/dL (ref 12.0–15.0)
Immature Granulocytes: 0 %
Lymphocytes Relative: 14 %
Lymphs Abs: 1 10*3/uL (ref 0.7–4.0)
MCH: 30.1 pg (ref 26.0–34.0)
MCHC: 31.7 g/dL (ref 30.0–36.0)
MCV: 95 fL (ref 80.0–100.0)
Monocytes Absolute: 0.6 10*3/uL (ref 0.1–1.0)
Monocytes Relative: 9 %
Neutro Abs: 4.8 10*3/uL (ref 1.7–7.7)
Neutrophils Relative %: 72 %
Platelets: 173 10*3/uL (ref 150–400)
RBC: 4.19 MIL/uL (ref 3.87–5.11)
RDW: 15 % (ref 11.5–15.5)
WBC: 6.7 10*3/uL (ref 4.0–10.5)
nRBC: 0 % (ref 0.0–0.2)

## 2020-10-02 LAB — COMPREHENSIVE METABOLIC PANEL
ALT: 19 U/L (ref 0–44)
AST: 20 U/L (ref 15–41)
Albumin: 3.3 g/dL — ABNORMAL LOW (ref 3.5–5.0)
Alkaline Phosphatase: 70 U/L (ref 38–126)
Anion gap: 8 (ref 5–15)
BUN: 20 mg/dL (ref 8–23)
CO2: 34 mmol/L — ABNORMAL HIGH (ref 22–32)
Calcium: 8.9 mg/dL (ref 8.9–10.3)
Chloride: 100 mmol/L (ref 98–111)
Creatinine, Ser: 0.62 mg/dL (ref 0.44–1.00)
GFR, Estimated: >60 mL/min (ref >60)
Glucose, Bld: 115 mg/dL — ABNORMAL HIGH (ref 70–99)
Potassium: 3.7 mmol/L (ref 3.5–5.1)
Sodium: 142 mmol/L (ref 135–145)
Total Bilirubin: 0.7 mg/dL (ref 0.3–1.2)
Total Protein: 7.3 g/dL (ref 6.5–8.1)

## 2020-10-02 LAB — MAGNESIUM: Magnesium: 2.3 mg/dL (ref 1.7–2.4)

## 2020-10-02 LAB — PHOSPHORUS: Phosphorus: 4.6 mg/dL (ref 2.5–4.6)

## 2020-10-02 MED ORDER — FUROSEMIDE 10 MG/ML IJ SOLN
60.0000 mg | Freq: Two times a day (BID) | INTRAMUSCULAR | Status: DC
  Administered 2020-10-03 – 2020-10-05 (×6): 60 mg via INTRAVENOUS
  Filled 2020-10-02 (×5): qty 6

## 2020-10-02 NOTE — Progress Notes (Signed)
PROGRESS NOTE    Carmen [name]  [id number]:[id number] DOB: [birth year] DOA: 09/26/2020 PCP: Carmen Athens, MD   Chief Complaint  Patient presents with  . Shortness of Breath    Brief Narrative: 64 year old female with history of chronic diastolic heart failure, morbid obesity, chronic respiratory failure on home O2 2 L history of endometrial cancer presenting with several day history of dyspnea on exertion.  She's currently requiring 4 L by [place] and is being treated for Mila Pair heart failure exacerbation.  Assessment & Plan:   Principal Problem:   Acute on chronic diastolic CHF (congestive heart failure) (HCC) Active Problems:   Acute on chronic respiratory failure with hypoxia (HCC)   Morbid obesity with BMI of 60.0-69.9, adult (HCC)   Elevated troponin   Acute CHF (congestive heart failure) (HCC)  Acute on chronic respiratory failure with hypoxia (HCC) -Continues to require more than her baseline, needing 2-4 L today (on 2 L at baseline).  -Secondary to CHF exacerbation -CTA chest negative for acute PE -No evidence of infiltrate on chest x-ray or CTA - Wean oxygen as able, check ambulatory pulse ox - Net negative 13.6 L   Acute on chronic diastolic CHF (congestive heart failure) (HCC) -Several day history of dyspnea on exertion, lower extremity edema -chest x-ray showing cardiomegaly and pulmonary vascular congestion - continue IV Lasix 60 mg twice daily, continue lisinopril, Coreg - CXR 11/6 with mild pulm edema, will continue diuresis -Echocardiogramwas overall [name] difficult study due to body habitus but shows normal LV systolic function. -Cardiologyinput appreciated -> recommends 40 mg PO lasix when ready for discharge, increase lisinopril to 20 mg daily in addition to coreg  Elevated troponin -Due to demand ischemia. No chest pain/MI - cardiology following   Chronic lymphedema -This is likely her biggest challenge. She reports this going on since 2015 but her PCP gave  her diagnosis last year She is not using Unna boots or following with any lymphedema clinic as of now -She may benefit from lymphedema clinic follow-up at discharge - wound care c/s -> appreciate woudn care recs  Morbid obesity with BMI of 60.0-69.9, adult ([place]) -Complicating factor to overall prognosis and care  Body mass index is 63.26 kg/m.  Discharge to [facility] with [facility] vs SNF -> SNF for rehab safest d/c option, she's not interested in this at this time, I've discussed to discuss with her husband about this as I think she needs more help at home than 1 person can safely provide.  Will ask SW to discuss again with patient as we continue to diurese her.  DVT prophylaxis: lovenox Code Status: full  Family Communication: none at bedside - husband at bedside 11/6 Disposition:   Status is: Inpatient  Remains inpatient appropriate because:Inpatient level of care appropriate due to severity of illness   Dispo: The patient is from: Home              Anticipated d/c is to: Home              Anticipated d/c date is: 2 days              Patient currently is not medically stable to d/c.       Consultants:  cardiology  Procedures:  Echo IMPRESSIONS    1. Left ventricular ejection fraction, by estimation, is 55 to 60%. The  left ventricle has normal function. Left ventricular endocardial border  not optimally defined to evaluate regional wall motion. Left ventricular  diastolic function could not  be  evaluated.  2. Right ventricular systolic function is normal. The right ventricular  size is normal.  3. Left atrial size was moderately dilated.  4. The mitral valve is normal in structure. No evidence of mitral valve  regurgitation. No evidence of mitral stenosis.  5. The aortic valve is normal in structure. Aortic valve regurgitation is  not visualized. Mild to moderate aortic valve sclerosis/calcification is  present, without any evidence of aortic stenosis.  6. Very  challenging image quality with limited views.   Antimicrobials Anti-infectives (From admission, onward)   None        Subjective: No new complaints Again concerned about discharge to SNF, wants to talk to SW about this  Objective: Vitals:   10/02/20 0537 10/02/20 0932 10/02/20 1208 10/02/20 1610  BP: 137/60 (!) 135/57 (!) 132/59 130/65  Pulse: 62 72 62 64  Resp: 18 18 20 20   Temp: 98.1 F (36.7 C) (!) 97.3 F (36.3 C) 97.7 F (36.5 C) 97.6 F (36.4 C)  TempSrc: Oral Oral Oral Oral  SpO2: 95% 92% 90% 93%  Weight:      Height:        Intake/Output Summary (Last 24 hours) at 10/02/2020 1811 Last data filed at 10/02/2020 1013 Gross per 24 hour  Intake 120 ml  Output 1400 ml  Net -1280 ml   Filed Weights   09/30/20 0430 10/01/20 0500 10/02/20 0500  Weight: 65.1 kg (!) 142.3 kg (!) 139.9 kg    Examination:  General: No acute distress. Cardiovascular: Heart sounds show [name] regular rate, and rhythm Lungs: Clear to auscultation bilaterally  Abdomen: Soft, nontender, nondistended Neurological: Alert and oriented 3. Moves all extremities 4 . Cranial nerves II through XII grossly intact. Skin: Warm and dry. No rashes or lesions. Extremities: bilateral LE edema   Data Reviewed: I have personally reviewed following labs and imaging studies  CBC: Recent Labs  Lab 09/26/20 2213 09/27/20 0350 09/28/20 0547 09/29/20 0521 09/30/20 0559 10/01/20 0504 10/02/20 0520  WBC 10.0   < > 6.4 7.6 6.2 6.7 6.7  NEUTROABS 8.4*  --   --   --  4.5  --  4.8  HGB 12.8   < > 11.6* 12.5 12.0 12.4 12.6  HCT 39.4   < > 36.2 38.7 38.0 38.3 39.8  MCV 95.9   < > 95.5 95.6 96.7 93.4 95.0  PLT 166   < > 160 156 156 170 173   < > = values in this interval not displayed.    Basic Metabolic Panel: Recent Labs  Lab 09/28/20 0547 09/29/20 0521 09/30/20 0559 10/01/20 0504 10/02/20 0520  NA 141 142 142 141 142  K 3.6 3.5 3.6 3.4* 3.7  CL 100 100 100 100 100  CO2 33* 32 34* 32 34*    GLUCOSE 110* 121* 111* 114* 115*  BUN 17 16 17 19 20   CREATININE 0.65 0.69 0.66 0.56 0.62  CALCIUM 8.7* 9.0 8.7* 9.1 8.9  MG  --   --  2.1 2.4 2.3  PHOS  --   --  4.3  --  4.6    GFR: Estimated Creatinine Clearance: 92.4 mL/min (by C-G formula based on SCr of 0.62 mg/dL).  Liver Function Tests: Recent Labs  Lab 09/26/20 2213 09/30/20 0559 10/02/20 0520  AST 22 14* 20  ALT 17 13 19   ALKPHOS 85 63 70  BILITOT 1.1 0.8 0.7  PROT 8.2* 7.1 7.3  ALBUMIN 3.7 3.2* 3.3*    CBG: No  results for input(s): GLUCAP in the last 168 hours.   Recent Results (from the past 240 hour(s))  Respiratory Panel by RT PCR (Flu Sabriya Yono&B, Covid) - Nasopharyngeal Swab     Status: None   Collection Time: 09/27/20  3:04 AM   Specimen: Nasopharyngeal Swab  Result Value Ref Range Status   SARS Coronavirus 2 by RT PCR NEGATIVE NEGATIVE Final    Comment: (NOTE) SARS-CoV-2 target nucleic acids are NOT DETECTED.  The SARS-CoV-2 RNA is generally detectable in upper respiratoy specimens during the acute phase of infection. The lowest concentration of SARS-CoV-2 viral copies this assay can detect is 131 copies/mL. Marlin Jarrard negative result does not preclude SARS-Cov-2 infection and should not be used as the sole basis for treatment or other patient management decisions. Katalena Malveaux negative result may occur with  improper specimen collection/handling, submission of specimen other than nasopharyngeal swab, presence of viral mutation(s) within the areas targeted by this assay, and inadequate number of viral copies (<131 copies/mL). Nykayla Marcelli negative result must be combined with clinical observations, patient history, and epidemiological information. The expected result is Negative.  Fact Sheet for Patients:  PinkCheek.be  Fact Sheet for Healthcare Providers:  GravelBags.it  This test is no t yet approved or cleared by the [place] FDA and  has been authorized for  detection and/or diagnosis of SARS-CoV-2 by FDA under an Emergency Use Authorization (EUA). This EUA will remain  in effect (meaning this test can be used) for the duration of the COVID-19 declaration under Section 564(b)(1) of the Act, 21 U.S.C. section 360bbb-3(b)(1), unless the authorization is terminated or revoked sooner.     Influenza Arlean Thies by PCR NEGATIVE NEGATIVE Final   Influenza B by PCR NEGATIVE NEGATIVE Final    Comment: (NOTE) The Xpert Xpress SARS-CoV-2/FLU/RSV assay is intended as an aid in  the diagnosis of influenza from Nasopharyngeal swab specimens and  should not be used as Jame Seelig sole basis for treatment. Nasal washings and  aspirates are unacceptable for Xpert Xpress SARS-CoV-2/FLU/RSV  testing.  Fact Sheet for Patients: PinkCheek.be  Fact Sheet for Healthcare Providers: GravelBags.it  This test is not yet approved or cleared by the [place] FDA and  has been authorized for detection and/or diagnosis of SARS-CoV-2 by  FDA under an Emergency Use Authorization (EUA). This EUA will remain  in effect (meaning this test can be used) for the duration of the  Covid-19 declaration under Section 564(b)(1) of the Act, 21  U.S.C. section 360bbb-3(b)(1), unless the authorization is  terminated or revoked. Performed at [facility], [address], [place], [place] [address]          Radiology Studies: DG Chest [place] 1 View  Result Date: 10/02/2020 CLINICAL DATA:  Shortness of breath, acute on chronic diastolic heart failure, morbid obesity, chronic respiratory failure, history of endometrial cancer EXAM: PORTABLE CHEST 1 VIEW COMPARISON:  Portable exam 0651 hours compared to 09/26/2020 FINDINGS: Enlargement of cardiac silhouette with pulmonary vascular congestion. Scattered interstitial infiltrate consistent with mild pulmonary edema and CHF. Atherosclerotic calcification and tortuosity of thoracic  aorta. No pleural effusion or pneumothorax. Osseous structures unremarkable. IMPRESSION: Enlargement of cardiac silhouette with pulmonary vascular congestion and mild pulmonary edema. Electronically Signed   By: Lavonia Dana M.D.   On: 10/02/2020 12:32        Scheduled Meds: . carvedilol  6.25 mg Oral BID WC  . enoxaparin (LOVENOX) injection  0.5 mg/kg Subcutaneous Q24H  . [START ON 10/03/2020] furosemide  60 mg Intravenous BID  .  lisinopril  20 mg Oral Daily  . loratadine  10 mg Oral Daily  . sodium chloride flush  3 mL Intravenous Q12H   Continuous Infusions: . sodium chloride       LOS: 5 days    Time spent: over 30 min    Fayrene Helper, MD [facility]   To contact the attending provider between 7A-7P or the covering provider during after hours 7P-7A, please log into the web site [email or web address] and access using universal [place] password for that web site. If you do not have the password, please call the hospital operator.  10/02/2020, 6:11 PM

## 2020-10-03 ENCOUNTER — Inpatient Hospital Stay: Payer: Medicare HMO

## 2020-10-03 DIAGNOSIS — I5033 Acute on chronic diastolic (congestive) heart failure: Secondary | ICD-10-CM | POA: Diagnosis not present

## 2020-10-03 LAB — CBC WITH DIFFERENTIAL/PLATELET
Abs Immature Granulocytes: 0.02 10*3/uL (ref 0.00–0.07)
Basophils Absolute: 0 10*3/uL (ref 0.0–0.1)
Basophils Relative: 1 %
Eosinophils Absolute: 0.2 10*3/uL (ref 0.0–0.5)
Eosinophils Relative: 4 %
HCT: 39.8 % (ref 36.0–46.0)
Hemoglobin: 12.7 g/dL (ref 12.0–15.0)
Immature Granulocytes: 0 %
Lymphocytes Relative: 16 %
Lymphs Abs: 0.9 10*3/uL (ref 0.7–4.0)
MCH: 31 pg (ref 26.0–34.0)
MCHC: 31.9 g/dL (ref 30.0–36.0)
MCV: 97.1 fL (ref 80.0–100.0)
Monocytes Absolute: 0.5 10*3/uL (ref 0.1–1.0)
Monocytes Relative: 9 %
Neutro Abs: 4 10*3/uL (ref 1.7–7.7)
Neutrophils Relative %: 70 %
Platelets: 162 10*3/uL (ref 150–400)
RBC: 4.1 MIL/uL (ref 3.87–5.11)
RDW: 14.9 % (ref 11.5–15.5)
WBC: 5.8 10*3/uL (ref 4.0–10.5)
nRBC: 0 % (ref 0.0–0.2)

## 2020-10-03 LAB — COMPREHENSIVE METABOLIC PANEL
ALT: 22 U/L (ref 0–44)
AST: 23 U/L (ref 15–41)
Albumin: 3.3 g/dL — ABNORMAL LOW (ref 3.5–5.0)
Alkaline Phosphatase: 74 U/L (ref 38–126)
Anion gap: 8 (ref 5–15)
BUN: 23 mg/dL (ref 8–23)
CO2: 36 mmol/L — ABNORMAL HIGH (ref 22–32)
Calcium: 8.7 mg/dL — ABNORMAL LOW (ref 8.9–10.3)
Chloride: 99 mmol/L (ref 98–111)
Creatinine, Ser: 0.64 mg/dL (ref 0.44–1.00)
GFR, Estimated: >60 mL/min (ref >60)
Glucose, Bld: 128 mg/dL — ABNORMAL HIGH (ref 70–99)
Potassium: 3.6 mmol/L (ref 3.5–5.1)
Sodium: 143 mmol/L (ref 135–145)
Total Bilirubin: 0.8 mg/dL (ref 0.3–1.2)
Total Protein: 7.5 g/dL (ref 6.5–8.1)

## 2020-10-03 LAB — BRAIN NATRIURETIC PEPTIDE: B Natriuretic Peptide: 73.8 pg/mL (ref 0.0–100.0)

## 2020-10-03 LAB — MAGNESIUM: Magnesium: 2.3 mg/dL (ref 1.7–2.4)

## 2020-10-03 LAB — PHOSPHORUS: Phosphorus: 4.6 mg/dL (ref 2.5–4.6)

## 2020-10-03 NOTE — Progress Notes (Addendum)
PROGRESS NOTE    Carmen [name]  FYB:[id number] DOB: February 26, 1956 DOA: 09/26/2020 PCP: Cletis Athens, MD   Chief Complaint  Patient presents with  . Shortness of Breath    Brief Narrative: 64 year old female with history of chronic diastolic heart failure, morbid obesity, chronic respiratory failure on home O2 2 L history of endometrial cancer presenting with several day history of dyspnea on exertion.  She's currently requiring 4 L by [place] and is being treated for Carmen [name] heart failure exacerbation.  Assessment & Plan:   Principal Problem:   Acute on chronic diastolic CHF (congestive heart failure) (HCC) Active Problems:   Acute on chronic respiratory failure with hypoxia (HCC)   Morbid obesity with BMI of 60.0-69.9, adult (HCC)   Elevated troponin   Acute CHF (congestive heart failure) (HCC)  Acute on chronic respiratory failure with hypoxia (HCC) -Continues to require more than her baseline, needing 2-4 L today (on 2 L at baseline).  -Secondary to CHF exacerbation -CTA chest negative for acute PE -No evidence of infiltrate on chest x-ray or CTA - Wean oxygen as able, check ambulatory pulse ox - Net negative 14.5 L   Acute on chronic diastolic CHF (congestive heart failure) (HCC) -Several day history of dyspnea on exertion, lower extremity edema -chest x-ray showing cardiomegaly and pulmonary vascular congestion - continue IV Lasix 60 mg twice daily, continue lisinopril, Coreg - CXR 11/6 with mild pulm edema, will continue diuresis - repeat CXR 11/8 - starting to develop alkalosis, follow CXR to held determine if continued aggressive diuresis needed -Echocardiogramwas overall Carmen [name] difficult study due to body habitus but shows normal LV systolic function. -Cardiologyinput appreciated -> recommends 40 mg PO lasix when ready for discharge, increase lisinopril to 20 mg daily in addition to coreg  Elevated troponin -Due to demand ischemia. No chest pain/MI - cardiology following    Chronic lymphedema -This is likely her biggest challenge. She reports this going on since 2015 but her PCP gave her diagnosis last year She is not using Unna boots or following with any lymphedema clinic as of now -She may benefit from lymphedema clinic follow-up at discharge - wound care c/s -> appreciate woudn care recs  Morbid obesity with BMI of 60.0-69.9, adult ([place]) -Complicating factor to overall prognosis and care  Body mass index is 63.26 kg/m.  Discharge to [facility] with [facility] vs SNF -> SNF for rehab safest d/c option, she's initially wasn't interested, but now is open.  Will ask SW to discuss again with patient as we continue to diurese her.  DVT prophylaxis: lovenox Code Status: full  Family Communication: none at bedside - husband at bedside 11/7 Disposition:   Status is: Inpatient  Remains inpatient appropriate because:Inpatient level of care appropriate due to severity of illness   Dispo: The patient is from: Home              Anticipated d/c is to: Home              Anticipated d/c date is: 2 days              Patient currently is not medically stable to d/c.       Consultants:  cardiology  Procedures:  Echo IMPRESSIONS    1. Left ventricular ejection fraction, by estimation, is 55 to 60%. The  left ventricle has normal function. Left ventricular endocardial border  not optimally defined to evaluate regional wall motion. Left ventricular  diastolic function could not be  evaluated.  2.  Right ventricular systolic function is normal. The right ventricular  size is normal.  3. Left atrial size was moderately dilated.  4. The mitral valve is normal in structure. No evidence of mitral valve  regurgitation. No evidence of mitral stenosis.  5. The aortic valve is normal in structure. Aortic valve regurgitation is  not visualized. Mild to moderate aortic valve sclerosis/calcification is  present, without any evidence of aortic stenosis.  6. Very  challenging image quality with limited views.   Antimicrobials Anti-infectives (From admission, onward)   None        Subjective: No new complaints, encouraged with progress with therapy, stood up today  Objective: Vitals:   10/02/20 2047 10/03/20 0001 10/03/20 0454 10/03/20 0727  BP: (!) 123/57 (!) 120/51 (!) 125/55 (!) 146/68  Pulse: 66 (!) 58 (!) 57 64  Resp: 20 20 20 17   Temp: 98 F (36.7 C) 97.9 F (36.6 C) 97.8 F (36.6 C) 98.1 F (36.7 C)  TempSrc: Oral Oral Oral Oral  SpO2: (!) 88% 95% (!) 89% 92%  Weight:      Height:        Intake/Output Summary (Last 24 hours) at 10/03/2020 1340 Last data filed at 10/03/2020 9326 Gross per 24 hour  Intake --  Output 900 ml  Net -900 ml   Filed Weights   09/30/20 0430 10/01/20 0500 10/02/20 0500  Weight: 65.1 kg (!) 142.3 kg (!) 139.9 kg    Examination:  General: No acute distress. Cardiovascular: Heart sounds show Carmen [name] regular rate, and rhythm. Lungs: distant, limited due to body habitus Abdomen: Soft, nontender, nondistended  Neurological: Alert and oriented 3. Moves all extremities 4. Cranial nerves II through XII grossly intact. Skin: Warm and dry. No rashes or lesions. Extremities: bilateral lymphedema, LEE   Data Reviewed: I have personally reviewed following labs and imaging studies  CBC: Recent Labs  Lab 09/26/20 2213 09/27/20 0350 09/29/20 0521 09/30/20 0559 10/01/20 0504 10/02/20 0520 10/03/20 0444  WBC 10.0   < > 7.6 6.2 6.7 6.7 5.8  NEUTROABS 8.4*  --   --  4.5  --  4.8 4.0  HGB 12.8   < > 12.5 12.0 12.4 12.6 12.7  HCT 39.4   < > 38.7 38.0 38.3 39.8 39.8  MCV 95.9   < > 95.6 96.7 93.4 95.0 97.1  PLT 166   < > 156 156 170 173 162   < > = values in this interval not displayed.    Basic Metabolic Panel: Recent Labs  Lab 09/29/20 0521 09/30/20 0559 10/01/20 0504 10/02/20 0520 10/03/20 0444  NA 142 142 141 142 143  K 3.5 3.6 3.4* 3.7 3.6  CL 100 100 100 100 99  CO2 32 34* 32 34* 36*   GLUCOSE 121* 111* 114* 115* 128*  BUN 16 17 19 20 23   CREATININE 0.69 0.66 0.56 0.62 0.64  CALCIUM 9.0 8.7* 9.1 8.9 8.7*  MG  --  2.1 2.4 2.3 2.3  PHOS  --  4.3  --  4.6 4.6    GFR: Estimated Creatinine Clearance: 92.4 mL/min (by C-G formula based on SCr of 0.64 mg/dL).  Liver Function Tests: Recent Labs  Lab 09/26/20 2213 09/30/20 0559 10/02/20 0520 10/03/20 0444  AST 22 14* 20 23  ALT 17 13 19 22   ALKPHOS 85 63 70 74  BILITOT 1.1 0.8 0.7 0.8  PROT 8.2* 7.1 7.3 7.5  ALBUMIN 3.7 3.2* 3.3* 3.3*    CBG: No results for input(s): GLUCAP  in the last 168 hours.   Recent Results (from the past 240 hour(s))  Respiratory Panel by RT PCR (Flu Chelcy Bolda&B, Covid) - Nasopharyngeal Swab     Status: None   Collection Time: 09/27/20  3:04 AM   Specimen: Nasopharyngeal Swab  Result Value Ref Range Status   SARS Coronavirus 2 by RT PCR NEGATIVE NEGATIVE Final    Comment: (NOTE) SARS-CoV-2 target nucleic acids are NOT DETECTED.  The SARS-CoV-2 RNA is generally detectable in upper respiratoy specimens during the acute phase of infection. The lowest concentration of SARS-CoV-2 viral copies this assay can detect is 131 copies/mL. Domique Reardon negative result does not preclude SARS-Cov-2 infection and should not be used as the sole basis for treatment or other patient management decisions. Grove Defina negative result may occur with  improper specimen collection/handling, submission of specimen other than nasopharyngeal swab, presence of viral mutation(s) within the areas targeted by this assay, and inadequate number of viral copies (<131 copies/mL). Jamez Ambrocio negative result must be combined with clinical observations, patient history, and epidemiological information. The expected result is Negative.  Fact Sheet for Patients:  PinkCheek.be  Fact Sheet for Healthcare Providers:  GravelBags.it  This test is no t yet approved or cleared by the [place] FDA  and  has been authorized for detection and/or diagnosis of SARS-CoV-2 by FDA under an Emergency Use Authorization (EUA). This EUA will remain  in effect (meaning this test can be used) for the duration of the COVID-19 declaration under Section 564(b)(1) of the Act, 21 U.S.C. section 360bbb-3(b)(1), unless the authorization is terminated or revoked sooner.     Influenza Ryne Mctigue by PCR NEGATIVE NEGATIVE Final   Influenza B by PCR NEGATIVE NEGATIVE Final    Comment: (NOTE) The Xpert Xpress SARS-CoV-2/FLU/RSV assay is intended as an aid in  the diagnosis of influenza from Nasopharyngeal swab specimens and  should not be used as Iysha Mishkin sole basis for treatment. Nasal washings and  aspirates are unacceptable for Xpert Xpress SARS-CoV-2/FLU/RSV  testing.  Fact Sheet for Patients: PinkCheek.be  Fact Sheet for Healthcare Providers: GravelBags.it  This test is not yet approved or cleared by the [place] FDA and  has been authorized for detection and/or diagnosis of SARS-CoV-2 by  FDA under an Emergency Use Authorization (EUA). This EUA will remain  in effect (meaning this test can be used) for the duration of the  Covid-19 declaration under Section 564(b)(1) of the Act, 21  U.S.C. section 360bbb-3(b)(1), unless the authorization is  terminated or revoked. Performed at [facility], [address] [place], [place] [address]          Radiology Studies: DG Chest [place] 1 View  Result Date: 10/02/2020 CLINICAL DATA:  Shortness of breath, acute on chronic diastolic heart failure, morbid obesity, chronic respiratory failure, history of endometrial cancer EXAM: PORTABLE CHEST 1 VIEW COMPARISON:  Portable exam 0651 hours compared to 09/26/2020 FINDINGS: Enlargement of cardiac silhouette with pulmonary vascular congestion. Scattered interstitial infiltrate consistent with mild pulmonary edema and CHF. Atherosclerotic calcification  and tortuosity of thoracic aorta. No pleural effusion or pneumothorax. Osseous structures unremarkable. IMPRESSION: Enlargement of cardiac silhouette with pulmonary vascular congestion and mild pulmonary edema. Electronically Signed   By: Lavonia Dana M.D.   On: 10/02/2020 12:32        Scheduled Meds: . carvedilol  6.25 mg Oral BID WC  . enoxaparin (LOVENOX) injection  0.5 mg/kg Subcutaneous Q24H  . furosemide  60 mg Intravenous BID  . lisinopril  20 mg Oral  Daily  . loratadine  10 mg Oral Daily  . sodium chloride flush  3 mL Intravenous Q12H   Continuous Infusions: . sodium chloride       LOS: 6 days    Time spent: over 30 min    Fayrene Helper, MD [facility]   To contact the attending provider between 7A-7P or the covering provider during after hours 7P-7A, please log into the web site [email or web address] and access using universal [place] password for that web site. If you do not have the password, please call the hospital operator.  10/03/2020, 1:40 PM

## 2020-10-03 NOTE — Progress Notes (Signed)
Physical Therapy Treatment Patient Details Name: Carmen [name] MRN: [id number] DOB: September 11, 1956 Today's Date: 10/03/2020    History of Present Illness Carmen [name] is a 64 year old female who presents to ED with increased SOB and dyspnea.  Imaging negative for PE. PMH of chronic diastolic HF, morbid oesity, chronic lymphedema, chronic respiratory failure on home O2 with 2L, endometrial cancer, chronic lymphedema.    PT Comments    Pt transferring back to bed from [facility] with NA upon entry, agreeable to PT session. Min-modA to sitting EOB from supine. Pt performs 3x STS from EOB, desat to 84% with SOB, recovery over 90sec back to >88%. Pt performs again on 6L/min, bedsat to 86% with quicker recovery, less SOB. AMB deferred due to quick desat and limited O@ delivery system. Pt requires no assist to stand, also appears to have minimal balance deficits in static stance. Pt more successful with bariatric RW this session, left in room at EOS.      Follow Up Recommendations  SNF     Equipment Recommendations  Wheelchair (measurements PT)    Recommendations for Other Services       Precautions / Restrictions Precautions Precautions: Fall Restrictions Other Position/Activity Restrictions: bilateral LE lymphedema    Mobility  Bed Mobility Overal bed mobility: Needs Assistance Bed Mobility: Supine to Sit     Supine to sit: Min assist     General bed mobility comments: Increased time + bed rail use, MIN of legs  Transfers Overall transfer level: Needs assistance Equipment used: Rolling walker (2 wheeled) Transfers: Sit to/from Stand Sit to Stand: Supervision         General transfer comment: 1x for assessment  Ambulation/Gait Ambulation/Gait assistance:  (deferred)               Stairs             Wheelchair Mobility    Modified Rankin (Stroke Patients Only)       Balance                                            Cognition  Arousal/Alertness: Awake/alert Behavior During Therapy: WFL for tasks assessed/performed;Anxious Overall Cognitive Status: Within Functional Limits for tasks assessed                                        Exercises Other Exercises Other Exercises: STS from EOB, bariRW once up, two sets of three on 4L and 6L, terminal SpO2: 84% and 86% respectively; pt on 1-2L baseline.    General Comments        Pertinent Vitals/Pain Pain Assessment: Faces Faces Pain Scale: Hurts little more Pain Location: BLE blisters sore against bed durign transfers Pain Intervention(s): Limited activity within patient's tolerance;Monitored during session;Repositioned    Home Living                      Prior Function            PT Goals (current goals can now be found in the care plan section) Acute Rehab PT Goals Patient Stated Goal: to be able to move PT Goal Formulation: With patient Time For Goal Achievement: 10/11/20 Potential to Achieve Goals: Fair Progress towards PT goals: Progressing toward goals  Frequency    Min 2X/week      PT Plan Current plan remains appropriate    Co-evaluation              AM-PAC PT "6 Clicks" Mobility   Outcome Measure  Help needed turning from your back to your side while in a flat bed without using bedrails?: A Lot Help needed moving from lying on your back to sitting on the side of a flat bed without using bedrails?: A Little Help needed moving to and from a bed to a chair (including a wheelchair)?: A Little Help needed standing up from a chair using your arms (e.g., wheelchair or bedside chair)?: A Little Help needed to walk in hospital room?: A Little Help needed climbing 3-5 steps with a railing? : A Lot 6 Click Score: 16    End of Session Equipment Utilized During Treatment: Oxygen Activity Tolerance: Patient tolerated treatment well;No increased pain Patient left: in bed;with bed alarm set;with nursing/sitter in  room;with family/visitor present;Other (comment) (pt wants to remain sitting up at EOB at EOS) Nurse Communication: Mobility status PT Visit Diagnosis: Unsteadiness on feet (R26.81);Other abnormalities of gait and mobility (R26.89);Muscle weakness (generalized) (M62.81);Difficulty in walking, not elsewhere classified (R26.2);Pain Pain - Right/Left: Right Pain - part of body: Knee     Time: 1012-1040 PT Time Calculation (min) (ACUTE ONLY): 28 min  Charges:  $Therapeutic Exercise: 23-37 mins                     12:50 PM, 10/03/20 Etta Grandchild, PT, DPT Physical Therapist - [facility]  [phone] ([place])    [place] 10/03/2020, 12:44 PM

## 2020-10-04 DIAGNOSIS — I5033 Acute on chronic diastolic (congestive) heart failure: Secondary | ICD-10-CM | POA: Diagnosis not present

## 2020-10-04 LAB — CBC WITH DIFFERENTIAL/PLATELET
Abs Immature Granulocytes: 0.03 10*3/uL (ref 0.00–0.07)
Basophils Absolute: 0 10*3/uL (ref 0.0–0.1)
Basophils Relative: 1 %
Eosinophils Absolute: 0.2 10*3/uL (ref 0.0–0.5)
Eosinophils Relative: 4 %
HCT: 40.8 % (ref 36.0–46.0)
Hemoglobin: 13 g/dL (ref 12.0–15.0)
Immature Granulocytes: 1 %
Lymphocytes Relative: 18 %
Lymphs Abs: 1.1 10*3/uL (ref 0.7–4.0)
MCH: 30.7 pg (ref 26.0–34.0)
MCHC: 31.9 g/dL (ref 30.0–36.0)
MCV: 96.2 fL (ref 80.0–100.0)
Monocytes Absolute: 0.5 10*3/uL (ref 0.1–1.0)
Monocytes Relative: 8 %
Neutro Abs: 4.2 10*3/uL (ref 1.7–7.7)
Neutrophils Relative %: 68 %
Platelets: 162 10*3/uL (ref 150–400)
RBC: 4.24 MIL/uL (ref 3.87–5.11)
RDW: 14.9 % (ref 11.5–15.5)
WBC: 6.1 10*3/uL (ref 4.0–10.5)
nRBC: 0 % (ref 0.0–0.2)

## 2020-10-04 LAB — COMPREHENSIVE METABOLIC PANEL
ALT: 25 U/L (ref 0–44)
AST: 23 U/L (ref 15–41)
Albumin: 3.1 g/dL — ABNORMAL LOW (ref 3.5–5.0)
Alkaline Phosphatase: 68 U/L (ref 38–126)
Anion gap: 9 (ref 5–15)
BUN: 25 mg/dL — ABNORMAL HIGH (ref 8–23)
CO2: 34 mmol/L — ABNORMAL HIGH (ref 22–32)
Calcium: 8.7 mg/dL — ABNORMAL LOW (ref 8.9–10.3)
Chloride: 98 mmol/L (ref 98–111)
Creatinine, Ser: 0.66 mg/dL (ref 0.44–1.00)
GFR, Estimated: >60 mL/min (ref >60)
Glucose, Bld: 124 mg/dL — ABNORMAL HIGH (ref 70–99)
Potassium: 3.6 mmol/L (ref 3.5–5.1)
Sodium: 141 mmol/L (ref 135–145)
Total Bilirubin: 0.7 mg/dL (ref 0.3–1.2)
Total Protein: 7.2 g/dL (ref 6.5–8.1)

## 2020-10-04 LAB — MAGNESIUM: Magnesium: 2.2 mg/dL (ref 1.7–2.4)

## 2020-10-04 LAB — PHOSPHORUS: Phosphorus: 5 mg/dL — ABNORMAL HIGH (ref 2.5–4.6)

## 2020-10-04 NOTE — Progress Notes (Signed)
PROGRESS NOTE    Carmen [name]  MGQ:[id number] DOB: [birth year] DOA: 09/26/2020 PCP: Cletis Athens, MD   Chief Complaint  Patient presents with  . Shortness of Breath    Brief Narrative: 64 year old female with history of chronic diastolic heart failure, morbid obesity, chronic respiratory failure on home O2 2 L history of endometrial cancer presenting with several day history of dyspnea on exertion.  She's currently requiring 4 L by [place] and is being treated for Roel Douthat heart failure exacerbation.  Assessment & Plan:   Principal Problem:   Acute on chronic diastolic CHF (congestive heart failure) (HCC) Active Problems:   Acute on chronic respiratory failure with hypoxia (HCC)   Morbid obesity with BMI of 60.0-69.9, adult (HCC)   Elevated troponin   Acute CHF (congestive heart failure) (HCC)  Acute on chronic respiratory failure with hypoxia (HCC) -Continues to require more than her baseline, needing 2-4 L today (on 2 L at baseline).  -Secondary to CHF exacerbation -CTA chest negative for acute PE -No evidence of infiltrate on chest x-ray or CTA - Wean oxygen as able, check ambulatory pulse ox - Net negative 15.9 L   Acute on chronic diastolic CHF (congestive heart failure) (HCC) -Several day history of dyspnea on exertion, lower extremity edema -chest x-ray showing cardiomegaly and pulmonary vascular congestion - continue IV Lasix 60 mg twice daily, continue lisinopril, Coreg - CXR 11/6 with mild pulm edema, will continue diuresis - repeat CXR 11/7 - mild CHF, improved - repeat CXR 11/9 -Echocardiogramwas overall Ascher Schroepfer difficult study due to body habitus but shows normal LV systolic function. -Cardiologyinput appreciated -> recommends 40 mg PO lasix when ready for discharge, increase lisinopril to 20 mg daily in addition to coreg  Elevated troponin -Due to demand ischemia. No chest pain/MI - cardiology now signed off  Chronic lymphedema -This is likely her biggest  challenge. She reports this going on since 2015 but her PCP gave her diagnosis last year She is not using Unna boots or following with any lymphedema clinic as of now -She may benefit from lymphedema clinic follow-up at discharge - wound care c/s -> appreciate woudn care recs  Morbid obesity with BMI of 60.0-69.9, adult ([place]) -Complicating factor to overall prognosis and care  Body mass index is 63.26 kg/m.  Plan for d/c to SNF  DVT prophylaxis: lovenox Code Status: full  Family Communication: none at bedside - husband at bedside 11/7 Disposition:   Status is: Inpatient  Remains inpatient appropriate because:Inpatient level of care appropriate due to severity of illness   Dispo: The patient is from: Home              Anticipated d/c is to: Home              Anticipated d/c date is: 2 days              Patient currently is not medically stable to d/c.       Consultants:  cardiology  Procedures:  Echo IMPRESSIONS    1. Left ventricular ejection fraction, by estimation, is 55 to 60%. The  left ventricle has normal function. Left ventricular endocardial border  not optimally defined to evaluate regional wall motion. Left ventricular  diastolic function could not be  evaluated.  2. Right ventricular systolic function is normal. The right ventricular  size is normal.  3. Left atrial size was moderately dilated.  4. The mitral valve is normal in structure. No evidence of mitral valve  regurgitation. No  evidence of mitral stenosis.  5. The aortic valve is normal in structure. Aortic valve regurgitation is  not visualized. Mild to moderate aortic valve sclerosis/calcification is  present, without any evidence of aortic stenosis.  6. Very challenging image quality with limited views.   Antimicrobials Anti-infectives (From admission, onward)   None        Subjective: No new complaints today  Objective: Vitals:   10/04/20 0645 10/04/20 0836 10/04/20  0900 10/04/20 1138  BP: (!) 126/58 136/61  (!) 136/59  Pulse: 60 63  70  Resp: 20 18  18   Temp: 98.2 F (36.8 C) 98.6 F (37 C)  98.9 F (37.2 C)  TempSrc:  Oral  Oral  SpO2: 95% 94% 95% 90%  Weight:      Height:        Intake/Output Summary (Last 24 hours) at 10/04/2020 1423 Last data filed at 10/04/2020 1138 Gross per 24 hour  Intake 360 ml  Output 1800 ml  Net -1440 ml   Filed Weights   09/30/20 0430 10/01/20 0500 10/02/20 0500  Weight: 65.1 kg (!) 142.3 kg (!) 139.9 kg    Examination:  General: No acute distress. Cardiovascular: Heart sounds show Michaiah Holsopple regular rate, and rhythm.  Lungs: Clear to auscultation bilaterally . Abdomen: Soft, nontender, nondistended Neurological: Alert and oriented 3. Moves all extremities 4 . Cranial nerves II through XII grossly intact. Skin: Warm and dry. No rashes or lesions. Extremities: bilateral lymphedema   Data Reviewed: I have personally reviewed following labs and imaging studies  CBC: Recent Labs  Lab 09/30/20 0559 10/01/20 0504 10/02/20 0520 10/03/20 0444 10/04/20 0447  WBC 6.2 6.7 6.7 5.8 6.1  NEUTROABS 4.5  --  4.8 4.0 4.2  HGB 12.0 12.4 12.6 12.7 13.0  HCT 38.0 38.3 39.8 39.8 40.8  MCV 96.7 93.4 95.0 97.1 96.2  PLT 156 170 173 162 097    Basic Metabolic Panel: Recent Labs  Lab 09/30/20 0559 10/01/20 0504 10/02/20 0520 10/03/20 0444 10/04/20 0447  NA 142 141 142 143 141  K 3.6 3.4* 3.7 3.6 3.6  CL 100 100 100 99 98  CO2 34* 32 34* 36* 34*  GLUCOSE 111* 114* 115* 128* 124*  BUN 17 19 20 23  25*  CREATININE 0.66 0.56 0.62 0.64 0.66  CALCIUM 8.7* 9.1 8.9 8.7* 8.7*  MG 2.1 2.4 2.3 2.3 2.2  PHOS 4.3  --  4.6 4.6 5.0*    GFR: Estimated Creatinine Clearance: 92.4 mL/min (by C-G formula based on SCr of 0.66 mg/dL).  Liver Function Tests: Recent Labs  Lab 09/30/20 0559 10/02/20 0520 10/03/20 0444 10/04/20 0447  AST 14* 20 23 23   ALT 13 19 22 25   ALKPHOS 63 70 74 68  BILITOT 0.8 0.7 0.8 0.7  PROT  7.1 7.3 7.5 7.2  ALBUMIN 3.2* 3.3* 3.3* 3.1*    CBG: No results for input(s): GLUCAP in the last 168 hours.   Recent Results (from the past 240 hour(s))  Respiratory Panel by RT PCR (Flu Myeasha Ballowe&B, Covid) - Nasopharyngeal Swab     Status: None   Collection Time: 09/27/20  3:04 AM   Specimen: Nasopharyngeal Swab  Result Value Ref Range Status   SARS Coronavirus 2 by RT PCR NEGATIVE NEGATIVE Final    Comment: (NOTE) SARS-CoV-2 target nucleic acids are NOT DETECTED.  The SARS-CoV-2 RNA is generally detectable in upper respiratoy specimens during the acute phase of infection. The lowest concentration of SARS-CoV-2 viral copies this assay can detect is 131  copies/mL. Saamir Armstrong negative result does not preclude SARS-Cov-2 infection and should not be used as the sole basis for treatment or other patient management decisions. Conall Vangorder negative result may occur with  improper specimen collection/handling, submission of specimen other than nasopharyngeal swab, presence of viral mutation(s) within the areas targeted by this assay, and inadequate number of viral copies (<131 copies/mL). Laverda Stribling negative result must be combined with clinical observations, patient history, and epidemiological information. The expected result is Negative.  Fact Sheet for Patients:  PinkCheek.be  Fact Sheet for Healthcare Providers:  GravelBags.it  This test is no t yet approved or cleared by the [place] FDA and  has been authorized for detection and/or diagnosis of SARS-CoV-2 by FDA under an Emergency Use Authorization (EUA). This EUA will remain  in effect (meaning this test can be used) for the duration of the COVID-19 declaration under Section 564(b)(1) of the Act, 21 U.S.C. section 360bbb-3(b)(1), unless the authorization is terminated or revoked sooner.     Influenza Kadra Kohan by PCR NEGATIVE NEGATIVE Final   Influenza B by PCR NEGATIVE NEGATIVE Final    Comment:  (NOTE) The Xpert Xpress SARS-CoV-2/FLU/RSV assay is intended as an aid in  the diagnosis of influenza from Nasopharyngeal swab specimens and  should not be used as Jannet Calip sole basis for treatment. Nasal washings and  aspirates are unacceptable for Xpert Xpress SARS-CoV-2/FLU/RSV  testing.  Fact Sheet for Patients: PinkCheek.be  Fact Sheet for Healthcare Providers: GravelBags.it  This test is not yet approved or cleared by the [place] FDA and  has been authorized for detection and/or diagnosis of SARS-CoV-2 by  FDA under an Emergency Use Authorization (EUA). This EUA will remain  in effect (meaning this test can be used) for the duration of the  Covid-19 declaration under Section 564(b)(1) of the Act, 21  U.S.C. section 360bbb-3(b)(1), unless the authorization is  terminated or revoked. Performed at [facility], [address], [place], [place] [address]          Radiology Studies: DG Chest Edgar 1 View  Result Date: 10/03/2020 CLINICAL DATA:  Dyspnea, CHF EXAM: PORTABLE CHEST 1 VIEW COMPARISON:  Chest radiograph from one day prior. FINDINGS: Stable cardiomediastinal silhouette with mild cardiomegaly. No pneumothorax. No pleural effusion. Borderline mild pulmonary edema, improved. No consolidative airspace disease. IMPRESSION: Borderline mild congestive heart failure, improved. Electronically Signed   By: Ilona Sorrel M.D.   On: 10/03/2020 15:02        Scheduled Meds: . carvedilol  6.25 mg Oral BID WC  . enoxaparin (LOVENOX) injection  0.5 mg/kg Subcutaneous Q24H  . furosemide  60 mg Intravenous BID  . lisinopril  20 mg Oral Daily  . loratadine  10 mg Oral Daily  . sodium chloride flush  3 mL Intravenous Q12H   Continuous Infusions: . sodium chloride       LOS: 7 days    Time spent: over 30 min    Fayrene Helper, MD [facility]   To contact the attending provider between 7A-7P or  the covering provider during after hours 7P-7A, please log into the web site [email or web address] and access using universal [place] password for that web site. If you do not have the password, please call the hospital operator.  10/04/2020, 2:23 PM

## 2020-10-04 NOTE — Care Management Important Message (Signed)
Important Message  Patient Details  Name: Carmen [name] MRN: [id number] Date of Birth: Apr 01, 1956   Medicare Important Message Given:  Yes     Juliann Pulse A Ferol Laiche 10/04/2020, 11:33 AM

## 2020-10-04 NOTE — TOC Progression Note (Addendum)
Transition of Care [facility]) - Progression Note    Patient Details  Name: Carmen [name] MRN: [id number] Date of Birth: March 01, 1956  Transition of Care [facility]) CM/SW [place], LCSW Phone Number: 10/04/2020, 2:11 PM  Clinical Narrative:   CSW spoke with patient who confirmed she is now agreeable to SNF. Patient reported her preference would be [facility]. CSW obtained PASRR and sent out bed requests. Will also start insurance authorization.   2:50- Patient not in [place] portal. Call to [facility], spoke to [place]. He reported [facility] does not manage patient so receiving facility will have to get authorization.   Asked [facility] Rep Magda Paganini to review patient since that is patient's preference.    Expected Discharge Plan: [place] Barriers to Discharge: Continued Medical Work up  Expected Discharge Plan and Services Expected Discharge Plan: [place] arrangements for the past 2 months: Single Family Home                                       Social Determinants of Health (SDOH) Interventions    Readmission Risk Interventions No flowsheet data found.

## 2020-10-04 NOTE — NC FL2 (Signed)
[place] LEVEL OF CARE SCREENING TOOL     IDENTIFICATION  Patient Name: Carmen [name] Birthdate: [birth year] Sex: female Admission Date (Current Location): 09/26/2020  [place] and [place] Number:  Engineering geologist and Address:  [facility], [address], [place],  [address]      Provider Number: [id number]  Attending Physician Name and Address:  Elodia Florence., *  Relative Name and Phone Number:  [place]un[place], Pellegrino (Spouse) [phone] (Mobile)    Current Level of Care: Hospital Recommended Level of Care: [place] Prior Approval Number:    Date Approved/Denied:   PASRR Number: [id number]  Discharge Plan: SNF    Current Diagnoses: Patient Active Problem List   Diagnosis Date Noted  . Acute on chronic respiratory failure with hypoxia ([place]) 09/27/2020  . Morbid obesity with BMI of 60.0-69.9, adult ([place]) 09/27/2020  . Chronic diastolic CHF (congestive heart failure) ([place]) 09/27/2020  . Elevated troponin 09/27/2020  . Acute on chronic diastolic CHF (congestive heart failure) ([place]) 09/27/2020  . Acute CHF (congestive heart failure) ([place]) 09/27/2020  . Uterine carcinoma ([place]) 01/30/2012  . ABNORMAL GLANDULAR PAPANICOLAOU SMEAR OF CERVIX 10/10/2010  . SHORTNESS OF BREATH 09/30/2010  . OBESITY 09/02/2010  . KNEE PAIN, BILATERAL 09/02/2010    Orientation RESPIRATION BLADDER Height & Weight     Self, Time, Situation, Place  Normal External catheter, Continent Weight: (!) 308 lb 6.8 oz (139.9 kg) Height:  5\' 1"  (154.9 cm)  BEHAVIORAL SYMPTOMS/MOOD NEUROLOGICAL BOWEL NUTRITION STATUS      Continent Diet (2 gram sodium diet)  AMBULATORY STATUS COMMUNICATION OF NEEDS Skin   Extensive Assist Verbally Other (Comment) (cellulitis)                       Personal Care Assistance Level of Assistance  Bathing, Feeding, Dressing Bathing Assistance: Maximum assistance Feeding assistance:  Independent Dressing Assistance: Maximum assistance     Functional Limitations Info             SPECIAL CARE FACTORS FREQUENCY  PT (By licensed PT), OT (By licensed OT)     PT Frequency: 5 x/week OT Frequency: 5 x/week            Contractures      Additional Factors Info  Code Status, Allergies Code Status Info: full code Allergies Info: Clarithromycin, Penicillins           Current Medications (10/04/2020):  This is the current hospital active medication list Current Facility-Administered Medications  Medication Dose Route Frequency Provider Last Rate Last Admin  . 0.9 %  sodium chloride infusion  250 mL Intravenous PRN Athena Masse, MD      . acetaminophen (TYLENOL) tablet 650 mg  650 mg Oral Q4H PRN Athena Masse, MD   650 mg at 10/01/20 0433  . carvedilol (COREG) tablet 6.25 mg  6.25 mg Oral BID WC Corey Skains, MD   6.25 mg at 10/04/20 0951  . enoxaparin (LOVENOX) injection 75 mg  0.5 mg/kg Subcutaneous Q24H Oswald Hillock, RPH   75 mg at 10/04/20 0953  . furosemide (LASIX) injection 60 mg  60 mg Intravenous BID Elodia Florence., MD   60 mg at 10/04/20 1008  . lisinopril (ZESTRIL) tablet 20 mg  20 mg Oral Daily Corey Skains, MD   20 mg at 10/04/20 0951  . loratadine (CLARITIN) tablet 10 mg  10 mg Oral Daily Max Sane, MD  10 mg at 10/04/20 0952  . ondansetron (ZOFRAN) injection 4 mg  4 mg Intravenous Q6H PRN Judd Gaudier V, MD      . sodium chloride flush (NS) 0.9 % injection 3 mL  3 mL Intravenous Q12H Athena Masse, MD   3 mL at 10/04/20 0952  . sodium chloride flush (NS) 0.9 % injection 3 mL  3 mL Intravenous PRN Athena Masse, MD         Discharge Medications: Please see discharge summary for a list of discharge medications.  Relevant Imaging Results:  Relevant Lab Results:   Additional Information SS #: [place], LCSW

## 2020-10-05 ENCOUNTER — Ambulatory Visit: Payer: Self-pay | Admitting: Family

## 2020-10-05 ENCOUNTER — Inpatient Hospital Stay: Payer: Medicare HMO

## 2020-10-05 DIAGNOSIS — I5033 Acute on chronic diastolic (congestive) heart failure: Secondary | ICD-10-CM | POA: Diagnosis not present

## 2020-10-05 LAB — CBC WITH DIFFERENTIAL/PLATELET
Abs Immature Granulocytes: 0.02 10*3/uL (ref 0.00–0.07)
Basophils Absolute: 0 10*3/uL (ref 0.0–0.1)
Basophils Relative: 1 %
Eosinophils Absolute: 0.2 10*3/uL (ref 0.0–0.5)
Eosinophils Relative: 3 %
HCT: 41.8 % (ref 36.0–46.0)
Hemoglobin: 13.3 g/dL (ref 12.0–15.0)
Immature Granulocytes: 0 %
Lymphocytes Relative: 14 %
Lymphs Abs: 0.9 10*3/uL (ref 0.7–4.0)
MCH: 30.4 pg (ref 26.0–34.0)
MCHC: 31.8 g/dL (ref 30.0–36.0)
MCV: 95.7 fL (ref 80.0–100.0)
Monocytes Absolute: 0.6 10*3/uL (ref 0.1–1.0)
Monocytes Relative: 8 %
Neutro Abs: 5.1 10*3/uL (ref 1.7–7.7)
Neutrophils Relative %: 74 %
Platelets: 181 10*3/uL (ref 150–400)
RBC: 4.37 MIL/uL (ref 3.87–5.11)
RDW: 14.8 % (ref 11.5–15.5)
WBC: 6.9 10*3/uL (ref 4.0–10.5)
nRBC: 0 % (ref 0.0–0.2)

## 2020-10-05 LAB — COMPREHENSIVE METABOLIC PANEL
ALT: 24 U/L (ref 0–44)
AST: 24 U/L (ref 15–41)
Albumin: 3.5 g/dL (ref 3.5–5.0)
Alkaline Phosphatase: 69 U/L (ref 38–126)
Anion gap: 12 (ref 5–15)
BUN: 34 mg/dL — ABNORMAL HIGH (ref 8–23)
CO2: 33 mmol/L — ABNORMAL HIGH (ref 22–32)
Calcium: 8.6 mg/dL — ABNORMAL LOW (ref 8.9–10.3)
Chloride: 97 mmol/L — ABNORMAL LOW (ref 98–111)
Creatinine, Ser: 0.91 mg/dL (ref 0.44–1.00)
GFR, Estimated: >60 mL/min (ref >60)
Glucose, Bld: 119 mg/dL — ABNORMAL HIGH (ref 70–99)
Potassium: 3.6 mmol/L (ref 3.5–5.1)
Sodium: 142 mmol/L (ref 135–145)
Total Bilirubin: 0.8 mg/dL (ref 0.3–1.2)
Total Protein: 7.7 g/dL (ref 6.5–8.1)

## 2020-10-05 LAB — PHOSPHORUS: Phosphorus: 5.4 mg/dL — ABNORMAL HIGH (ref 2.5–4.6)

## 2020-10-05 LAB — MAGNESIUM: Magnesium: 2.3 mg/dL (ref 1.7–2.4)

## 2020-10-05 MED ORDER — FUROSEMIDE 10 MG/ML IJ SOLN
INTRAMUSCULAR | Status: AC
  Filled 2020-10-05: qty 8

## 2020-10-05 MED ORDER — ENSURE MAX PROTEIN PO LIQD
11.0000 [oz_av] | Freq: Every day | ORAL | Status: DC
  Administered 2020-10-06 – 2020-10-08 (×3): 11 [oz_av] via ORAL
  Filled 2020-10-05: qty 330

## 2020-10-05 NOTE — Progress Notes (Signed)
Initial Nutrition Assessment  DOCUMENTATION CODES:   Morbid obesity  INTERVENTION:  Provide Ensure Max Protein po once daily, each supplement provides 150 kcal and 30 grams of protein.  Reviewed nutrition education regarding CHF with patient (note to follow).  NUTRITION DIAGNOSIS:   Increased nutrient needs related to catabolic illness (CHF) as evidenced by estimated needs.  GOAL:   Patient will meet greater than or equal to 90% of their needs  MONITOR:   PO intake, Supplement acceptance, Labs, Weight trends, Skin, I & O's  REASON FOR ASSESSMENT:   Malnutrition Screening Tool    ASSESSMENT:   64 year old female with PMHx of osteoarthritis, endometrial cancer, CHF, lymphedema admitted with acute on chronic diastolic CHF.   Met with patient at bedside. She reports her appetite has been decreased since she has been in the hospital but she is still able to eat. Per chart patient is eating 100% of her meals. She reports at home she eats 3-4 small meals per day. She reports she typically eats baked or grilled chicken, broccoli, salads, and other vegetables. Patient reports she follows a low-sodium diet at home. She is requesting further information regarding low sodium diet. She reports she has not been able to weigh herself lately due to lymphedema and weight gain from fluid but feels she can weigh her self now. Also discussed importance of adequate protein intake. Patient requesting to try oral nutrition supplement to help meet protein needs.  Patient reports she has been losing weight during admission but it is related to diuresis. Patient reports she was up to 334 lbs on admission from fluid and has now lost down to around 308 lbs. Patient reports her dry weight is around 302 lbs.  Medications reviewed and include: Lasix 60 mg BID IV, lisinopril.  Labs reviewed: Chloride 97, CO2 33, BUN 34, Phosphorus 5.4.  Patient does not meet criteria for malnutrition at this  time.  NUTRITION - FOCUSED PHYSICAL EXAM:    Most Recent Value  Orbital Region No depletion  Upper Arm Region No depletion  Thoracic and Lumbar Region No depletion  Buccal Region No depletion  Temple Region No depletion  Clavicle Bone Region No depletion  Clavicle and Acromion Bone Region No depletion  Scapular Bone Region No depletion  Dorsal Hand No depletion  Patellar Region Unable to assess  Anterior Thigh Region Unable to assess  Posterior Calf Region Unable to assess  Edema (RD Assessment) --  [lymphedema to bilateral lower extremities]  Hair Reviewed  Eyes Reviewed  Mouth Reviewed  Skin Reviewed  Nails Reviewed     Diet Order:   Diet Order            Diet 2 gram sodium Room service appropriate? Yes; Fluid consistency: Thin  Diet effective now                EDUCATION NEEDS:   Education needs have been addressed  Skin:  Skin Assessment: Skin Integrity Issues: (lymphadema to bilateral lower extremities)  Last BM:  10/02/2020 per chart  Height:   Ht Readings from Last 1 Encounters:  09/29/20 _0  (1.549 m)   Weight:   Wt Readings from Last 1 Encounters:  10/05/20 (!) 139.8 kg   BMI:  Body mass index is 58.23 kg/m.  Estimated Nutritional Needs:   Kcal:  2300-2500  Protein:  115-125 grams  Fluid:  2 L/day  Jacklynn Barnacle, MS, RD, LDN Pager number available on Amion

## 2020-10-05 NOTE — Progress Notes (Signed)
Occupational Therapy Treatment Patient Details Name: Carmen [name] MRN: [id number] DOB: Dec 04, 1955 Today's Date: 10/05/2020    History of present illness Carmen [name] is a 64 year old female who presents to ED with increased SOB and dyspnea.  Imaging negative for PE. PMH of chronic diastolic HF, morbid oesity, chronic lymphedema, chronic respiratory failure on home O2 with 2L, endometrial cancer, chronic lymphedema.   OT comments  [name] was seen for OT treatment on this date. Upon arrival to room pt reclined in bed finishing meal, agreeable to tx. MAX A for LBD at bed level. CGA to exit L side of bed. SUPERVISION seated UB ADLs. CGA + Bari RW for ADL t/f. Pt completed orthostatics - endorses L knee pain in standing (not quantified). Completed x3 standing trials c MIN A + RW - pt uses momentum to stand, requires MIN A to stabilize RW. SpO2 desat ~87% on 4L [place] in standing, resolved c PLB and seated rest breaks to 90%. Pt making good progress toward goals. Pt continues to benefit from skilled OT services to maximize return to PLOF and minimize risk of future falls, injury, caregiver burden, and readmission. Will continue to follow POC. Discharge recommendation remains appropriate.   Orthostatic Vitals  Lying: BP 110/55 , MAP 73, HR 57, SpO2 95% Sitting: BP 131/63, MAP 79, HR 63, SpO2 89% Standing: BP 133/64, MAP 85, HR 75, SpO2 93% 3 min Standing: BP 145/111, MAP 122, HR 85, SpO2 87%     Follow Up Recommendations  SNF    Equipment Recommendations  Other (comment) (TBD)    Recommendations for Other Services      Precautions / Restrictions Precautions Precautions: Fall Restrictions Weight Bearing Restrictions: No Other Position/Activity Restrictions: bilateral LE lymphedema       Mobility Bed Mobility Overal bed mobility: Needs Assistance Bed Mobility: Supine to Sit;Sit to Supine     Supine to sit: Min guard Sit to supine: Mod assist   General bed mobility comments:  assist for BLE return to bed  Transfers Overall transfer level: Needs assistance Equipment used: Rolling walker (2 wheeled) Transfers: Sit to/from Stand Sit to Stand: MIN assist              Balance Overall balance assessment: Needs assistance Sitting-balance support: Feet supported;Single extremity supported Sitting balance-Leahy Scale: Good     Standing balance support: Bilateral upper extremity supported Standing balance-Leahy Scale: Fair          ADL either performed or assessed with clinical judgement   ADL Overall ADL's : Needs assistance/impaired        General ADL Comments: MAX A for LBD at bed level. SUPERVISION seated UB ADLs. MIN A + Bari RW for ADL t/f                Cognition Arousal/Alertness: Awake/alert Behavior During Therapy: WFL for tasks assessed/performed;Anxious Overall Cognitive Status: Within Functional Limits for tasks assessed               Exercises Exercises: Other exercises Other Exercises Other Exercises: Pt educated re: OT role, DME recs, falls prevention, home/routines modifications Other Exercises: LBD, self-feeding, sup<>sit, bed mobility, sit<>stand x3, sitting/standing balance/tolerance, ~5 steps in room, marching at EOB    Shoulder Instructions       General Comments orthostatics obtained    Pertinent Vitals/ Pain       Pain Assessment: No/denies pain         Frequency  Min 1X/week  Progress Toward Goals  OT Goals(current goals can now be found in the care plan section)  Progress towards OT goals: Progressing toward goals  Acute Rehab OT Goals Patient Stated Goal: to be able to move OT Goal Formulation: With patient Time For Goal Achievement: 10/12/20 Potential to Achieve Goals: Good ADL Goals Pt Will Perform Grooming: with supervision;sitting Pt Will Perform Lower Body Dressing: with mod assist;sitting/lateral leans;with caregiver independent in assisting Pt Will Transfer to Toilet: with  min assist;ambulating;regular height toilet (c LRAD PRN)  Plan Discharge plan remains appropriate;Frequency remains appropriate       AM-PAC OT "6 Clicks" Daily Activity     Outcome Measure   Help from another person eating meals?: None Help from another person taking care of personal grooming?: A Little Help from another person toileting, which includes using toliet, bedpan, or urinal?: A Lot Help from another person bathing (including washing, rinsing, drying)?: A Lot Help from another person to put on and taking off regular upper body clothing?: A Little Help from another person to put on and taking off regular lower body clothing?: A Lot 6 Click Score: 16    End of Session Equipment Utilized During Treatment: Oxygen;Rolling walker (4L [place])  OT Visit Diagnosis: Other abnormalities of gait and mobility (R26.89);Muscle weakness (generalized) (M62.81)   Activity Tolerance Patient tolerated treatment well   Patient Left in bed;with call bell/phone within reach;with nursing/sitter in room   Nurse Communication Mobility status        Time: 3007-6226 OT Time Calculation (min): 44 min  Charges: OT General Charges $OT Visit: 1 Visit OT Treatments $Self Care/Home Management : 8-22 mins $Therapeutic Activity: 8-22 mins $Therapeutic Exercise: 8-22 mins  Dessie Coma, M.S. OTR/L  10/05/20, 12:40 PM  ascom [phone]

## 2020-10-05 NOTE — Progress Notes (Signed)
Physical Therapy Treatment Patient Details Name: Carmen [name] MRN: [id number] DOB: 05-21-1956 Today's Date: 10/05/2020    History of Present Illness Carmen [name] is a 64 year old female who presents to ED with increased SOB and dyspnea.  Imaging negative for PE. PMH of chronic diastolic HF, morbid oesity, chronic lymphedema, chronic respiratory failure on home O2 with 2L, endometrial cancer, chronic lymphedema.    PT Comments    Pt was long sitting in bed upon arriving. She agrees to PT session and is cooperative and pleasant throughout. On 4 L upon arriving. Attempted wean to 2 L however pt unable to maintain >88%. Returned to 4 L and was able to tolerate. BP 145/58 (80) 93% on 4 L. No c/o fatigue or dizziness. Pt was very talkative and pleasant throughout. Was able to stand 4 x during session. Did ambulate 15 ft with RW (bariatric) without LOB and with sao2 > 89% on 4 L.  Limited by fatigue. Pt will benefit from continued skilled PT at SNF to address deficits and improve safe functional mobility. Pt was in bed at conclusion of session wityh call bell in reach, bed alarm in place and RN in room.    Follow Up Recommendations  SNF     Equipment Recommendations  Other (comment) (defer to next level of care)    Recommendations for Other Services       Precautions / Restrictions Precautions Precautions: Fall Restrictions Weight Bearing Restrictions: No Other Position/Activity Restrictions: bilateral LE lymphedema    Mobility  Bed Mobility Overal bed mobility: Needs Assistance Bed Mobility: Supine to Sit;Sit to Supine     Supine to sit: Min assist Sit to supine: Mod assist   General bed mobility comments: min assist to exit bed but mod assist with BLe rogression to return to supine.  Transfers Overall transfer level: Needs assistance Equipment used: Rolling walker (2 wheeled) Transfers: Sit to/from Stand Sit to Stand: Min guard;Supervision         General transfer  comment: CGA at first progressing to supervision. performed STS 4 x throughout session standing ~ 30sec.-2 minutes each trial  Ambulation/Gait Ambulation/Gait assistance: Min guard Gait Distance (Feet): 15 Feet Assistive device: Rolling walker (2 wheeled) (Bariatric) Gait Pattern/deviations: Step-through pattern Gait velocity: decreased   General Gait Details: pt was limited by O2 line length. attempted to wean from 4 L to 2 L but pt unable to maintain > 90%         Balance Overall balance assessment: Needs assistance Sitting-balance support: Feet supported Sitting balance-Leahy Scale: Normal Sitting balance - Comments: no LOB   Standing balance support: Bilateral upper extremity supported Standing balance-Leahy Scale: Good Standing balance comment: no LOB with BUE support on RW. even able to let go of RW and static standing without LOB       Cognition Arousal/Alertness: Awake/alert Behavior During Therapy: WFL for tasks assessed/performed Overall Cognitive Status: Within Functional Limits for tasks assessed        General Comments: Pt is A and O x 4. very talkative throughout session but motivated         General Comments General comments (skin integrity, edema, etc.): BP was stable throughout      Pertinent Vitals/Pain Pain Assessment: No/denies pain           PT Goals (current goals can now be found in the care plan section) Acute Rehab PT Goals Patient Stated Goal: to be able to move Progress towards PT goals: Progressing toward goals  Frequency    Min 2X/week      PT Plan Current plan remains appropriate       AM-PAC PT "6 Clicks" Mobility   Outcome Measure  Help needed turning from your back to your side while in a flat bed without using bedrails?: A Little Help needed moving from lying on your back to sitting on the side of a flat bed without using bedrails?: A Little Help needed moving to and from a bed to a chair (including a  wheelchair)?: A Little Help needed standing up from a chair using your arms (e.g., wheelchair or bedside chair)?: A Little Help needed to walk in hospital room?: A Lot Help needed climbing 3-5 steps with a railing? : Total 6 Click Score: 15    End of Session Equipment Utilized During Treatment: Oxygen (4L) Activity Tolerance: Patient tolerated treatment well;Patient limited by fatigue;No increased pain Patient left: in bed;with bed alarm set;with nursing/sitter in room;with family/visitor present;Other (comment) Nurse Communication: Mobility status PT Visit Diagnosis: Unsteadiness on feet (R26.81);Other abnormalities of gait and mobility (R26.89);Muscle weakness (generalized) (M62.81);Difficulty in walking, not elsewhere classified (R26.2);Pain Pain - Right/Left: Right Pain - part of body: Knee     Time: 6314-9702 PT Time Calculation (min) (ACUTE ONLY): 33 min  Charges:  $Gait Training: 8-22 mins $Therapeutic Activity: 8-22 mins                     Julaine Fusi PTA 10/05/20, 4:35 PM

## 2020-10-05 NOTE — Progress Notes (Signed)
PROGRESS NOTE    Carmen [name]  FAO:[id number] DOB: 26-Feb-1956 DOA: 09/26/2020 PCP: Cletis Athens, MD   Chief Complaint  Patient presents with  . Shortness of Breath    Brief Narrative: 64 year old female with history of chronic diastolic heart failure, morbid obesity, chronic respiratory failure on home O2 2 L history of endometrial cancer presenting with several day history of dyspnea on exertion.  She's currently requiring 4 L by [place] and is being treated for Starling Christofferson heart failure exacerbation.  She's currently awaiting insurance approval for SNF.  Assessment & Plan:   Principal Problem:   Acute on chronic diastolic CHF (congestive heart failure) (HCC) Active Problems:   Acute on chronic respiratory failure with hypoxia (HCC)   Morbid obesity with BMI of 60.0-69.9, adult (HCC)   Elevated troponin   Acute CHF (congestive heart failure) (HCC)  Acute on chronic respiratory failure with hypoxia (HCC) -Continues to require more than her baseline, needing 2-4 L today (on 2 L at baseline).  -Secondary to CHF exacerbation -CTA chest negative for acute PE -No evidence of infiltrate on chest x-ray or CTA - Wean oxygen as able, check ambulatory pulse ox - Net negative 15.9 L   Acute on chronic diastolic CHF (congestive heart failure) (HCC) -Several day history of dyspnea on exertion, lower extremity edema -chest x-ray showing cardiomegaly and pulmonary vascular congestion - continue IV Lasix 60 mg twice daily - (c/o LH today, check orthostatics - pending), continue lisinopril, Coreg - CXR 11/6 with mild pulm edema, will continue diuresis - repeat CXR 11/7 - mild CHF, improved - repeat CXR 11/9 - mild vascular congestion without overt pulm edema -Echocardiogramwas overall Dezeray Puccio difficult study due to body habitus but shows normal LV systolic function. -Cardiologyinput appreciated -> recommends 40 mg PO lasix when ready for discharge, increase lisinopril to 20 mg daily in addition to  coreg  Elevated troponin -Due to demand ischemia. No chest pain/MI - cardiology now signed off  Chronic lymphedema -This is likely her biggest challenge. She reports this going on since 2015 but her PCP gave her diagnosis last year She is not using Unna boots or following with any lymphedema clinic as of now -She may benefit from lymphedema clinic follow-up at discharge - wound care c/s -> appreciate woudn care recs  Morbid obesity with BMI of 60.0-69.9, adult ([place]) -Complicating factor to overall prognosis and care  Body mass index is 63.26 kg/m.  Plan for d/c to SNF  DVT prophylaxis: lovenox Code Status: full  Family Communication: none at bedside - husband at bedside 11/7 Disposition:   Status is: Inpatient  Remains inpatient appropriate because:Inpatient level of care appropriate due to severity of illness   Dispo: The patient is from: Home              Anticipated d/c is to: Home              Anticipated d/c date is: 2 days              Patient currently is not medically stable to d/c.       Consultants:  cardiology  Procedures:  Echo IMPRESSIONS    1. Left ventricular ejection fraction, by estimation, is 55 to 60%. The  left ventricle has normal function. Left ventricular endocardial border  not optimally defined to evaluate regional wall motion. Left ventricular  diastolic function could not be  evaluated.  2. Right ventricular systolic function is normal. The right ventricular  size is normal.  3. Left atrial size was moderately dilated.  4. The mitral valve is normal in structure. No evidence of mitral valve  regurgitation. No evidence of mitral stenosis.  5. The aortic valve is normal in structure. Aortic valve regurgitation is  not visualized. Mild to moderate aortic valve sclerosis/calcification is  present, without any evidence of aortic stenosis.  6. Very challenging image quality with limited views.    Antimicrobials Anti-infectives (From admission, onward)   None        Subjective: C/o lightheadedness when sitting up  Objective: Vitals:   10/05/20 1041 10/05/20 1046 10/05/20 1049 10/05/20 1051  BP: (!) 110/55 131/63 133/64 (!) 145/111  Pulse: 63 (!) 58 75   Resp:      Temp:      TempSrc:      SpO2: 95% 94% 93%   Weight:      Height:        Intake/Output Summary (Last 24 hours) at 10/05/2020 1223 Last data filed at 10/05/2020 1035 Gross per 24 hour  Intake 720 ml  Output 450 ml  Net 270 ml   Filed Weights   10/01/20 0500 10/02/20 0500 10/05/20 0500  Weight: (!) 142.3 kg (!) 139.9 kg (!) 139.8 kg    Examination:  General: No acute distress. Cardiovascular: Heart sounds show Taydem Cavagnaro regular rate, and rhythm. Lungs: Clear to auscultation bilaterally  Abdomen: Soft, nontender, nondistended Neurological: Alert and oriented 3. Moves all extremities 4. Cranial nerves II through XII grossly intact. Skin: Warm and dry. No rashes or lesions. Extremities: bilateral lymphedema  Data Reviewed: I have personally reviewed following labs and imaging studies  CBC: Recent Labs  Lab 09/30/20 0559 09/30/20 0559 10/01/20 0504 10/02/20 0520 10/03/20 0444 10/04/20 0447 10/05/20 0534  WBC 6.2   < > 6.7 6.7 5.8 6.1 6.9  NEUTROABS 4.5  --   --  4.8 4.0 4.2 5.1  HGB 12.0   < > 12.4 12.6 12.7 13.0 13.3  HCT 38.0   < > 38.3 39.8 39.8 40.8 41.8  MCV 96.7   < > 93.4 95.0 97.1 96.2 95.7  PLT 156   < > 170 173 162 162 181   < > = values in this interval not displayed.    Basic Metabolic Panel: Recent Labs  Lab 09/30/20 0559 09/30/20 0559 10/01/20 0504 10/02/20 0520 10/03/20 0444 10/04/20 0447 10/05/20 0534  NA 142   < > 141 142 143 141 142  K 3.6   < > 3.4* 3.7 3.6 3.6 3.6  CL 100   < > 100 100 99 98 97*  CO2 34*   < > 32 34* 36* 34* 33*  GLUCOSE 111*   < > 114* 115* 128* 124* 119*  BUN 17   < > 19 20 23  25* 34*  CREATININE 0.66   < > 0.56 0.62 0.64 0.66 0.91   CALCIUM 8.7*   < > 9.1 8.9 8.7* 8.7* 8.6*  MG 2.1   < > 2.4 2.3 2.3 2.2 2.3  PHOS 4.3  --   --  4.6 4.6 5.0* 5.4*   < > = values in this interval not displayed.    GFR: Estimated Creatinine Clearance: 81.2 mL/min (by C-G formula based on SCr of 0.91 mg/dL).  Liver Function Tests: Recent Labs  Lab 09/30/20 0559 10/02/20 0520 10/03/20 0444 10/04/20 0447 10/05/20 0534  AST 14* 20 23 23 24   ALT 13 19 22 25 24   ALKPHOS 63 70 74 68 69  BILITOT 0.8  0.7 0.8 0.7 0.8  PROT 7.1 7.3 7.5 7.2 7.7  ALBUMIN 3.2* 3.3* 3.3* 3.1* 3.5    CBG: No results for input(s): GLUCAP in the last 168 hours.   Recent Results (from the past 240 hour(s))  Respiratory Panel by RT PCR (Flu Abdi Husak&B, Covid) - Nasopharyngeal Swab     Status: None   Collection Time: 09/27/20  3:04 AM   Specimen: Nasopharyngeal Swab  Result Value Ref Range Status   SARS Coronavirus 2 by RT PCR NEGATIVE NEGATIVE Final    Comment: (NOTE) SARS-CoV-2 target nucleic acids are NOT DETECTED.  The SARS-CoV-2 RNA is generally detectable in upper respiratoy specimens during the acute phase of infection. The lowest concentration of SARS-CoV-2 viral copies this assay can detect is 131 copies/mL. Makenah Karas negative result does not preclude SARS-Cov-2 infection and should not be used as the sole basis for treatment or other patient management decisions. Lasha Echeverria negative result may occur with  improper specimen collection/handling, submission of specimen other than nasopharyngeal swab, presence of viral mutation(s) within the areas targeted by this assay, and inadequate number of viral copies (<131 copies/mL). Bahja Bence negative result must be combined with clinical observations, patient history, and epidemiological information. The expected result is Negative.  Fact Sheet for Patients:  PinkCheek.be  Fact Sheet for Healthcare Providers:  GravelBags.it  This test is no t yet approved or cleared by  the [place] FDA and  has been authorized for detection and/or diagnosis of SARS-CoV-2 by FDA under an Emergency Use Authorization (EUA). This EUA will remain  in effect (meaning this test can be used) for the duration of the COVID-19 declaration under Section 564(b)(1) of the Act, 21 U.S.C. section 360bbb-3(b)(1), unless the authorization is terminated or revoked sooner.     Influenza Janeane Cozart by PCR NEGATIVE NEGATIVE Final   Influenza B by PCR NEGATIVE NEGATIVE Final    Comment: (NOTE) The Xpert Xpress SARS-CoV-2/FLU/RSV assay is intended as an aid in  the diagnosis of influenza from Nasopharyngeal swab specimens and  should not be used as Lorice Lafave sole basis for treatment. Nasal washings and  aspirates are unacceptable for Xpert Xpress SARS-CoV-2/FLU/RSV  testing.  Fact Sheet for Patients: PinkCheek.be  Fact Sheet for Healthcare Providers: GravelBags.it  This test is not yet approved or cleared by the [place] FDA and  has been authorized for detection and/or diagnosis of SARS-CoV-2 by  FDA under an Emergency Use Authorization (EUA). This EUA will remain  in effect (meaning this test can be used) for the duration of the  Covid-19 declaration under Section 564(b)(1) of the Act, 21  U.S.C. section 360bbb-3(b)(1), unless the authorization is  terminated or revoked. Performed at [facility], [address] [place], [place] [address]          Radiology Studies: [facility] Chest [place] 1 View  Result Date: 10/05/2020 CLINICAL DATA:  Shortness of breath. EXAM: PORTABLE CHEST 1 VIEW COMPARISON:  10/03/2020 FINDINGS: The heart is mildly enlarged but stable. Mild vascular congestion without overt pulmonary edema. No definite infiltrates or effusions. IMPRESSION: Cardiac enlargement and mild vascular congestion without overt pulmonary edema. Electronically Signed   By: Marijo Sanes M.D.   On: 10/05/2020 07:53         Scheduled Meds: . carvedilol  6.25 mg Oral BID WC  . enoxaparin (LOVENOX) injection  0.5 mg/kg Subcutaneous Q24H  . furosemide      . furosemide  60 mg Intravenous BID  . lisinopril  20 mg Oral Daily  .  loratadine  10 mg Oral Daily  . sodium chloride flush  3 mL Intravenous Q12H   Continuous Infusions: . sodium chloride       LOS: 8 days    Time spent: over 30 min    Fayrene Helper, MD [facility]   To contact the attending provider between 7A-7P or the covering provider during after hours 7P-7A, please log into the web site [email or web address] and access using universal [place] password for that web site. If you do not have the password, please call the hospital operator.  10/05/2020, 12:23 PM

## 2020-10-05 NOTE — TOC Progression Note (Addendum)
Transition of Care [facility]) - Progression Note    Patient Details  Name: NARDA FUNDORA MRN: [id number] Date of Birth: 11-08-1956  [facility]) CM/SW [place], LCSW Phone Number: 10/05/2020, 10:40 AM  Clinical Narrative:   Reached out to Magda Paganini at [facility] who is re-reviewing patient's information to see if they can make a bed offer.   Bed requests pending at [facility]. CSW asked Admissions Workers at [facility] and [facility] to review bed requests. Penny from [facility] reported they are full until the end of next week.   10:55- Update from [facility] with [facility] that they can accept patient. CSW talked to patient, she confirmed she wants to go to [facility]. Magda Paganini to start insurance authorization. Asked Magda Paganini to update bed offer in the hub.    Expected Discharge Plan: [place] Barriers to Discharge: Continued Medical Work up  Expected Discharge Plan and Services Expected Discharge Plan: [place] arrangements for the past 2 months: Single Family Home                                       Social Determinants of Health (SDOH) Interventions    Readmission Risk Interventions No flowsheet data found.

## 2020-10-05 NOTE — Plan of Care (Signed)
Nutrition Education Note  RD identified need for nutrition education regarding CHF during initial assessment.  RD provided "Heart Failure Nutrition Therapy" handout from the Academy of Nutrition and Dietetics. Reviewed patient's dietary recall. Provided examples on ways to decrease sodium intake in diet. Discouraged intake of processed foods and use of salt shaker. Encouraged fresh fruits and vegetables as well as whole grain sources of carbohydrates to maximize fiber intake.   RD discussed why it is important for patient to adhere to diet recommendations, and emphasized the role of fluids, foods to avoid, and importance of weighing self daily. Teach back method used.  Expect good compliance.  Body mass index is 58.23 kg/m. Pt meets criteria for obesity class III based on current BMI.  Current diet order is 2 gram sodium, patient is consuming approximately 100% of meals at this time. Labs and medications reviewed. No further nutrition interventions warranted at this time. RD contact information provided. If additional nutrition issues arise, please re-consult RD.   Jacklynn Barnacle, MS, RD, LDN Pager number available on Amion

## 2020-10-06 DIAGNOSIS — I5033 Acute on chronic diastolic (congestive) heart failure: Secondary | ICD-10-CM | POA: Diagnosis not present

## 2020-10-06 LAB — COMPREHENSIVE METABOLIC PANEL
ALT: 23 U/L (ref 0–44)
AST: 21 U/L (ref 15–41)
Albumin: 3.3 g/dL — ABNORMAL LOW (ref 3.5–5.0)
Alkaline Phosphatase: 66 U/L (ref 38–126)
Anion gap: 10 (ref 5–15)
BUN: 36 mg/dL — ABNORMAL HIGH (ref 8–23)
CO2: 33 mmol/L — ABNORMAL HIGH (ref 22–32)
Calcium: 8.9 mg/dL (ref 8.9–10.3)
Chloride: 99 mmol/L (ref 98–111)
Creatinine, Ser: 0.68 mg/dL (ref 0.44–1.00)
GFR, Estimated: >60 mL/min (ref >60)
Glucose, Bld: 121 mg/dL — ABNORMAL HIGH (ref 70–99)
Potassium: 3.6 mmol/L (ref 3.5–5.1)
Sodium: 142 mmol/L (ref 135–145)
Total Bilirubin: 0.7 mg/dL (ref 0.3–1.2)
Total Protein: 7.6 g/dL (ref 6.5–8.1)

## 2020-10-06 LAB — CBC WITH DIFFERENTIAL/PLATELET
Abs Immature Granulocytes: 0.02 10*3/uL (ref 0.00–0.07)
Basophils Absolute: 0 10*3/uL (ref 0.0–0.1)
Basophils Relative: 1 %
Eosinophils Absolute: 0.2 10*3/uL (ref 0.0–0.5)
Eosinophils Relative: 4 %
HCT: 41.2 % (ref 36.0–46.0)
Hemoglobin: 13.1 g/dL (ref 12.0–15.0)
Immature Granulocytes: 0 %
Lymphocytes Relative: 13 %
Lymphs Abs: 0.8 10*3/uL (ref 0.7–4.0)
MCH: 30.7 pg (ref 26.0–34.0)
MCHC: 31.8 g/dL (ref 30.0–36.0)
MCV: 96.5 fL (ref 80.0–100.0)
Monocytes Absolute: 0.5 10*3/uL (ref 0.1–1.0)
Monocytes Relative: 8 %
Neutro Abs: 4.6 10*3/uL (ref 1.7–7.7)
Neutrophils Relative %: 74 %
Platelets: 187 10*3/uL (ref 150–400)
RBC: 4.27 MIL/uL (ref 3.87–5.11)
RDW: 14.7 % (ref 11.5–15.5)
WBC: 6.2 10*3/uL (ref 4.0–10.5)
nRBC: 0 % (ref 0.0–0.2)

## 2020-10-06 LAB — PHOSPHORUS: Phosphorus: 4.3 mg/dL (ref 2.5–4.6)

## 2020-10-06 LAB — MAGNESIUM: Magnesium: 2.2 mg/dL (ref 1.7–2.4)

## 2020-10-06 MED ORDER — FUROSEMIDE 10 MG/ML IJ SOLN
INTRAMUSCULAR | Status: AC
  Filled 2020-10-06: qty 8

## 2020-10-06 MED ORDER — FUROSEMIDE 40 MG PO TABS
40.0000 mg | ORAL_TABLET | Freq: Two times a day (BID) | ORAL | Status: DC
  Administered 2020-10-06 – 2020-10-08 (×4): 40 mg via ORAL
  Filled 2020-10-06 (×4): qty 1

## 2020-10-06 MED ORDER — FUROSEMIDE 40 MG PO TABS: 60.0000 mg | ORAL_TABLET | Freq: Two times a day (BID) | ORAL | Status: DC

## 2020-10-06 NOTE — TOC Progression Note (Addendum)
Transition of Care [facility]) - Progression Note    Patient Details  Name: Carmen [name] MRN: [id number] Date of Birth: 07-Jun-1956  Transition of Care [facility]) CM/SW [place], LCSW Phone Number: 10/06/2020, 8:39 AM  Clinical Narrative:   CSW received update from [employer] who reported she has Ship broker. CSW asked MD if patient is ready to discharge to [facility] today.  10:30- Update from Dr. Doristine Bosworth, plan for discharge to [facility] tomorrow. Confirmed with [employer].    Expected Discharge Plan: [place] Barriers to Discharge: Continued Medical Work up  Expected Discharge Plan and Services Expected Discharge Plan: [place] arrangements for the past 2 months: Single Family Home                                       Social Determinants of Health (SDOH) Interventions    Readmission Risk Interventions No flowsheet data found.

## 2020-10-06 NOTE — Progress Notes (Addendum)
PROGRESS NOTE    Carmen [name]  YYT:[id number] DOB: 1956/01/09 DOA: 09/26/2020 PCP: Cletis Athens, MD   Brief Narrative:  64 year old female with history of chronic diastolic heart failure, morbid obesity, chronic respiratory failure on home O2 2 L history of endometrial cancer presenting with several day history of dyspnea on exertion.  She's currently requiring 4 L by [place] and is being treated for a heart failure exacerbation.    Assessment & Plan:   Acute on chronic respiratory failure with hypoxia: -Secondary to underlying acute on chronic diastolic CHF. -Continues to require more than her baseline, needing 2-4 L today (on 2 L at baseline).  -CTA chest negative for acute PE.   -No evidence of infiltrate on chest x-ray or CTA - Wean oxygen as able  Acute on chronic diastolic CHF (congestive heart failure) (HCC) -chest x-ray showing cardiomegaly and pulmonary vascular congestion -Changed IV Lasix 60 twice daily to 40 p.o. twice daily (Home dose) -continuelisinopril, Coreg - CXR 11/6 with mild pulm edema, will continue diuresis - repeat CXR 11/7 - mild CHF, improved - repeat CXR 11/9 - mild vascular congestion without overt pulm edema -Echocardiogramwas overall a difficult study due to body habitus but shows normal LV systolic function. -Strict INO's and daily weight.  Monitor electrolytes and kidney function closely. -Cardiologyinput appreciated -> recommends 40 mg PO lasix when ready for discharge, increase lisinopril to 20 mg daily in addition to coreg -Anticipating DC to SNF tomorrow a.m.  Elevated troponin -Due to demand ischemia. No chest pain/MI - cardiology now signed off  Chronic lymphedema -She may benefit fromlymphedema clinic follow-upat discharge - wound care consulted-> appreciate woudn care recs  Morbid obesity with BMI of 60.0-69.9, adult ([place]) -Complicating factor to overall prognosis and care  DVT prophylaxis: Lovenox Code Status: Full  code Family Communication: None present at bedside.  Plan of care discussed with patient in length and she verbalized understanding and agreed with it. Disposition Plan: SNF tomorrow a.m.  Consultants:   Cardiology  Procedures:   Echo  Antimicrobials:   None  Status is: Inpatient  Dispo: The patient is from: Home              Anticipated d/c is to: SNF              Anticipated d/c date is: 10/07/2020              Patient currently not medically stable for the discharge.  Subjective: Patient seen and examined.  Tells me that he feels better overall.  Still requiring 4 L of oxygen via nasal cannula.  Denies chest pain, shortness of breath, orthopnea or PND.  Objective: Vitals:   10/06/20 0036 10/06/20 0452 10/06/20 0505 10/06/20 0857  BP: (!) 111/59  111/64 (!) 126/54  Pulse: 63  63 63  Resp: 18  20 18   Temp: 98.7 F (37.1 C)  98 F (36.7 C) 98.1 F (36.7 C)  TempSrc:   Oral Oral  SpO2: 94%   93%  Weight:  (!) 139 kg    Height:        Intake/Output Summary (Last 24 hours) at 10/06/2020 1151 Last data filed at 10/06/2020 1020 Gross per 24 hour  Intake 360 ml  Output 2800 ml  Net -2440 ml   Filed Weights   10/02/20 0500 10/05/20 0500 10/06/20 0452  Weight: (!) 139.9 kg (!) 139.8 kg (!) 139 kg    Examination:  General exam: Appears calm and comfortable  Respiratory system: Clear  to auscultation. Respiratory effort normal. Cardiovascular system: S1 & S2 heard, RRR. No JVD, murmurs, rubs, gallops or clicks. No pedal edema. Gastrointestinal system: Abdomen is nondistended, soft and nontender. No organomegaly or masses felt. Normal bowel sounds heard. Central nervous system: Alert and oriented. No focal neurological deficits. Extremities: Symmetric 5 x 5 power. Skin: No rashes, lesions or ulcers Psychiatry: Judgement and insight appear normal. Mood & affect appropriate.    Data Reviewed: I have personally reviewed following labs and imaging  studies  CBC: Recent Labs  Lab 10/02/20 0520 10/03/20 0444 10/04/20 0447 10/05/20 0534 10/06/20 0523  WBC 6.7 5.8 6.1 6.9 6.2  NEUTROABS 4.8 4.0 4.2 5.1 4.6  HGB 12.6 12.7 13.0 13.3 13.1  HCT 39.8 39.8 40.8 41.8 41.2  MCV 95.0 97.1 96.2 95.7 96.5  PLT 173 162 162 181 660   Basic Metabolic Panel: Recent Labs  Lab 10/02/20 0520 10/03/20 0444 10/04/20 0447 10/05/20 0534 10/06/20 0523  NA 142 143 141 142 142  K 3.7 3.6 3.6 3.6 3.6  CL 100 99 98 97* 99  CO2 34* 36* 34* 33* 33*  GLUCOSE 115* 128* 124* 119* 121*  BUN 20 23 25* 34* 36*  CREATININE 0.62 0.64 0.66 0.91 0.68  CALCIUM 8.9 8.7* 8.7* 8.6* 8.9  MG 2.3 2.3 2.2 2.3 2.2  PHOS 4.6 4.6 5.0* 5.4* 4.3   GFR: Estimated Creatinine Clearance: 92.1 mL/min (by C-G formula based on SCr of 0.68 mg/dL). Liver Function Tests: Recent Labs  Lab 10/02/20 0520 10/03/20 0444 10/04/20 0447 10/05/20 0534 10/06/20 0523  AST 20 23 23 24 21   ALT 19 22 25 24 23   ALKPHOS 70 74 68 69 66  BILITOT 0.7 0.8 0.7 0.8 0.7  PROT 7.3 7.5 7.2 7.7 7.6  ALBUMIN 3.3* 3.3* 3.1* 3.5 3.3*   No results for input(s): LIPASE, AMYLASE in the last 168 hours. No results for input(s): AMMONIA in the last 168 hours. Coagulation Profile: No results for input(s): INR, PROTIME in the last 168 hours. Cardiac Enzymes: No results for input(s): CKTOTAL, CKMB, CKMBINDEX, TROPONINI in the last 168 hours. BNP (last 3 results) No results for input(s): PROBNP in the last 8760 hours. HbA1C: No results for input(s): HGBA1C in the last 72 hours. CBG: No results for input(s): GLUCAP in the last 168 hours. Lipid Profile: No results for input(s): CHOL, HDL, LDLCALC, TRIG, CHOLHDL, LDLDIRECT in the last 72 hours. Thyroid Function Tests: No results for input(s): TSH, T4TOTAL, FREET4, T3FREE, THYROIDAB in the last 72 hours. Anemia Panel: No results for input(s): VITAMINB12, FOLATE, FERRITIN, TIBC, IRON, RETICCTPCT in the last 72 hours. Sepsis Labs: No results for  input(s): PROCALCITON, LATICACIDVEN in the last 168 hours.  Recent Results (from the past 240 hour(s))  Respiratory Panel by RT PCR (Flu A&B, Covid) - Nasopharyngeal Swab     Status: None   Collection Time: 09/27/20  3:04 AM   Specimen: Nasopharyngeal Swab  Result Value Ref Range Status   SARS Coronavirus 2 by RT PCR NEGATIVE NEGATIVE Final    Comment: (NOTE) SARS-CoV-2 target nucleic acids are NOT DETECTED.  The SARS-CoV-2 RNA is generally detectable in upper respiratoy specimens during the acute phase of infection. The lowest concentration of SARS-CoV-2 viral copies this assay can detect is 131 copies/mL. A negative result does not preclude SARS-Cov-2 infection and should not be used as the sole basis for treatment or other patient management decisions. A negative result may occur with  improper specimen collection/handling, submission of specimen other than nasopharyngeal  swab, presence of viral mutation(s) within the areas targeted by this assay, and inadequate number of viral copies (<131 copies/mL). A negative result must be combined with clinical observations, patient history, and epidemiological information. The expected result is Negative.  Fact Sheet for Patients:  PinkCheek.be  Fact Sheet for Healthcare Providers:  GravelBags.it  This test is no t yet approved or cleared by the [place] FDA and  has been authorized for detection and/or diagnosis of SARS-CoV-2 by FDA under an Emergency Use Authorization (EUA). This EUA will remain  in effect (meaning this test can be used) for the duration of the COVID-19 declaration under Section 564(b)(1) of the Act, 21 U.S.C. section 360bbb-3(b)(1), unless the authorization is terminated or revoked sooner.     Influenza A by PCR NEGATIVE NEGATIVE Final   Influenza B by PCR NEGATIVE NEGATIVE Final    Comment: (NOTE) The Xpert Xpress SARS-CoV-2/FLU/RSV assay is intended  as an aid in  the diagnosis of influenza from Nasopharyngeal swab specimens and  should not be used as a sole basis for treatment. Nasal washings and  aspirates are unacceptable for Xpert Xpress SARS-CoV-2/FLU/RSV  testing.  Fact Sheet for Patients: PinkCheek.be  Fact Sheet for Healthcare Providers: GravelBags.it  This test is not yet approved or cleared by the [place] FDA and  has been authorized for detection and/or diagnosis of SARS-CoV-2 by  FDA under an Emergency Use Authorization (EUA). This EUA will remain  in effect (meaning this test can be used) for the duration of the  Covid-19 declaration under Section 564(b)(1) of the Act, 21  U.S.C. section 360bbb-3(b)(1), unless the authorization is  terminated or revoked. Performed at [facility], [address] [place], [place] [address]       Radiology Studies: [facility] Chest [place] 1 View  Result Date: 10/05/2020 CLINICAL DATA:  Shortness of breath. EXAM: PORTABLE CHEST 1 VIEW COMPARISON:  10/03/2020 FINDINGS: The heart is mildly enlarged but stable. Mild vascular congestion without overt pulmonary edema. No definite infiltrates or effusions. IMPRESSION: Cardiac enlargement and mild vascular congestion without overt pulmonary edema. Electronically Signed   By: Marijo Sanes M.D.   On: 10/05/2020 07:53    Scheduled Meds: . carvedilol  6.25 mg Oral BID WC  . enoxaparin (LOVENOX) injection  0.5 mg/kg Subcutaneous Q24H  . furosemide      . furosemide  40 mg Oral BID  . lisinopril  20 mg Oral Daily  . loratadine  10 mg Oral Daily  . Ensure Max Protein  11 oz Oral Daily  . sodium chloride flush  3 mL Intravenous Q12H   Continuous Infusions: . sodium chloride       LOS: 9 days   Time spent: 40 minutes.   Mckinley Jewel, MD [facility]  If 7PM-7AM, please contact night-coverage [email or web address] 10/06/2020, 11:51 AM

## 2020-10-07 DIAGNOSIS — I5033 Acute on chronic diastolic (congestive) heart failure: Secondary | ICD-10-CM | POA: Diagnosis not present

## 2020-10-07 LAB — BASIC METABOLIC PANEL
Anion gap: 9 (ref 5–15)
BUN: 39 mg/dL — ABNORMAL HIGH (ref 8–23)
CO2: 34 mmol/L — ABNORMAL HIGH (ref 22–32)
Calcium: 9.5 mg/dL (ref 8.9–10.3)
Chloride: 97 mmol/L — ABNORMAL LOW (ref 98–111)
Creatinine, Ser: 0.94 mg/dL (ref 0.44–1.00)
GFR, Estimated: >60 mL/min (ref >60)
Glucose, Bld: 141 mg/dL — ABNORMAL HIGH (ref 70–99)
Potassium: 4 mmol/L (ref 3.5–5.1)
Sodium: 140 mmol/L (ref 135–145)

## 2020-10-07 LAB — SARS CORONAVIRUS 2 BY RT PCR (HOSPITAL ORDER, PERFORMED IN ~~LOC~~ HOSPITAL LAB): SARS Coronavirus 2: NEGATIVE

## 2020-10-07 MED ORDER — FUROSEMIDE 40 MG PO TABS: 40.0000 mg | ORAL_TABLET | Freq: Two times a day (BID) | ORAL | 1 refills | Status: AC

## 2020-10-07 MED ORDER — CARVEDILOL 6.25 MG PO TABS
6.2500 mg | ORAL_TABLET | Freq: Two times a day (BID) | ORAL | 0 refills | Status: DC
Start: 2020-10-07 — End: 2020-10-28

## 2020-10-07 NOTE — Plan of Care (Signed)

## 2020-10-07 NOTE — Care Management Important Message (Signed)
Important Message  Patient Details  Name: Carmen [name] MRN: [id number] Date of Birth: July 01, 1956   Medicare Important Message Given:  Yes     Juliann Pulse A Alayah Knouff 10/07/2020, 10:34 AM

## 2020-10-07 NOTE — Progress Notes (Signed)
PROGRESS NOTE    Carmen [name]  WVP:[id number] DOB: 1956-07-02 DOA: 09/26/2020 PCP: Cletis Athens, MD   Brief Narrative:  64 year old female with history of chronic diastolic heart failure, morbid obesity, chronic respiratory failure on home O2 2 L history of endometrial cancer presenting with several day history of dyspnea on exertion.  She's currently requiring 4 L by [place] and is being treated for a heart failure exacerbation.    Assessment & Plan:   Acute on chronic respiratory failure with hypoxia: -Secondary to underlying acute on chronic diastolic CHF. -Continues to require more than her baseline, needing 2-4 L today (on 2 L at baseline).  -CTA chest negative for acute PE.   -No evidence of infiltrate on chest x-ray or CTA - Wean oxygen as able  Acute on chronic diastolic CHF (congestive heart failure) (HCC) -chest x-ray showing cardiomegaly and pulmonary vascular congestion -Continue Lasix 40 p.o. twice daily -continuelisinopril, Coreg - CXR 11/6 with mild pulm edema, will continue diuresis - repeat CXR 11/7 - mild CHF, improved - repeat CXR 11/9 - mild vascular congestion without overt pulm edema -Echocardiogramwas overall a difficult study due to body habitus but shows normal LV systolic function. -Strict INO's and daily weight.  Monitor electrolytes and kidney function closely. -Cardiologyinput appreciated -> recommends 40 mg PO lasix when ready for discharge, increase lisinopril to 20 mg daily in addition to coreg  Elevated troponin -Due to demand ischemia. No chest pain/MI - cardiology now signed off  Chronic lymphedema -She may benefit fromlymphedema clinic follow-upat discharge - wound care consulted-> appreciate woudn care recs  Morbid obesity with BMI of 60.0-69.9, adult ([place]) -Complicating factor to overall prognosis and care  Patient was discharged to SNF today however did not go.  Please see case manager's note.  DVT prophylaxis:  Lovenox Code Status: Full code Family Communication: None present at bedside.  Plan of care discussed with patient in length and she verbalized understanding and agreed with it. Disposition Plan: SNF tomorrow a.m.  Consultants:   Cardiology  Procedures:   Echo  Antimicrobials:   None  Status is: Inpatient  Dispo: The patient is from: Home              Anticipated d/c is to: SNF              Anticipated d/c date is: 10/08/2020              Patient currently not medically stable for the discharge.  Subjective: Patient seen and examined.  Sitting comfortably on the bed.  Tells me that she feels better.  Denies chest pain, shortness of breath, orthopnea, PND, nausea, vomiting, abdominal pain.  Objective: Vitals:   10/07/20 0017 10/07/20 0458 10/07/20 0743 10/07/20 1145  BP: (!) 114/52 (!) 124/56 114/72 (!) 110/51  Pulse: 82 (!) 55 (!) 57 66  Resp: 20 20 16 16   Temp: 98.2 F (36.8 C) 97.9 F (36.6 C) 97.9 F (36.6 C) 98.3 F (36.8 C)  TempSrc: Oral     SpO2: 92% 94% 99% 97%  Weight:      Height:        Intake/Output Summary (Last 24 hours) at 10/07/2020 1241 Last data filed at 10/07/2020 1036 Gross per 24 hour  Intake 360 ml  Output 300 ml  Net 60 ml   Filed Weights   10/02/20 0500 10/05/20 0500 10/06/20 0452  Weight: (!) 139.9 kg (!) 139.8 kg (!) 139 kg    Examination:  General exam: Appears calm  and comfortable, obese, on 4 L oxygen via nasal cannula, communicating well. Respiratory system: Clear to auscultation. Respiratory effort normal. Cardiovascular system: S1 & S2 heard, RRR. No JVD, murmurs, rubs, gallops or clicks. No pedal edema. Gastrointestinal system: Abdomen is nondistended, soft and nontender. No organomegaly or masses felt. Normal bowel sounds heard. Central nervous system: Alert and oriented. No focal neurological deficits. Extremities: Symmetric 5 x 5 power.  Bilateral chronic lymphedema noted in lower extremities Skin: No rashes, lesions  or ulcers. Psychiatry: Judgement and insight appear normal. Mood & affect appropriate.   Data Reviewed: I have personally reviewed following labs and imaging studies  CBC: Recent Labs  Lab 10/02/20 0520 10/03/20 0444 10/04/20 0447 10/05/20 0534 10/06/20 0523  WBC 6.7 5.8 6.1 6.9 6.2  NEUTROABS 4.8 4.0 4.2 5.1 4.6  HGB 12.6 12.7 13.0 13.3 13.1  HCT 39.8 39.8 40.8 41.8 41.2  MCV 95.0 97.1 96.2 95.7 96.5  PLT 173 162 162 181 338   Basic Metabolic Panel: Recent Labs  Lab 10/02/20 0520 10/02/20 0520 10/03/20 0444 10/04/20 0447 10/05/20 0534 10/06/20 0523 10/07/20 0622  NA 142   < > 143 141 142 142 140  K 3.7   < > 3.6 3.6 3.6 3.6 4.0  CL 100   < > 99 98 97* 99 97*  CO2 34*   < > 36* 34* 33* 33* 34*  GLUCOSE 115*   < > 128* 124* 119* 121* 141*  BUN 20   < > 23 25* 34* 36* 39*  CREATININE 0.62   < > 0.64 0.66 0.91 0.68 0.94  CALCIUM 8.9   < > 8.7* 8.7* 8.6* 8.9 9.5  MG 2.3  --  2.3 2.2 2.3 2.2  --   PHOS 4.6  --  4.6 5.0* 5.4* 4.3  --    < > = values in this interval not displayed.   GFR: Estimated Creatinine Clearance: 78.3 mL/min (by C-G formula based on SCr of 0.94 mg/dL). Liver Function Tests: Recent Labs  Lab 10/02/20 0520 10/03/20 0444 10/04/20 0447 10/05/20 0534 10/06/20 0523  AST 20 23 23 24 21   ALT 19 22 25 24 23   ALKPHOS 70 74 68 69 66  BILITOT 0.7 0.8 0.7 0.8 0.7  PROT 7.3 7.5 7.2 7.7 7.6  ALBUMIN 3.3* 3.3* 3.1* 3.5 3.3*   No results for input(s): LIPASE, AMYLASE in the last 168 hours. No results for input(s): AMMONIA in the last 168 hours. Coagulation Profile: No results for input(s): INR, PROTIME in the last 168 hours. Cardiac Enzymes: No results for input(s): CKTOTAL, CKMB, CKMBINDEX, TROPONINI in the last 168 hours. BNP (last 3 results) No results for input(s): PROBNP in the last 8760 hours. HbA1C: No results for input(s): HGBA1C in the last 72 hours. CBG: No results for input(s): GLUCAP in the last 168 hours. Lipid Profile: No results  for input(s): CHOL, HDL, LDLCALC, TRIG, CHOLHDL, LDLDIRECT in the last 72 hours. Thyroid Function Tests: No results for input(s): TSH, T4TOTAL, FREET4, T3FREE, THYROIDAB in the last 72 hours. Anemia Panel: No results for input(s): VITAMINB12, FOLATE, FERRITIN, TIBC, IRON, RETICCTPCT in the last 72 hours. Sepsis Labs: No results for input(s): PROCALCITON, LATICACIDVEN in the last 168 hours.  No results found for this or any previous visit (from the past 240 hour(s)).    Radiology Studies: No results found.  Scheduled Meds: . carvedilol  6.25 mg Oral BID WC  . enoxaparin (LOVENOX) injection  0.5 mg/kg Subcutaneous Q24H  . furosemide  40 mg  Oral BID  . lisinopril  20 mg Oral Daily  . loratadine  10 mg Oral Daily  . Ensure Max Protein  11 oz Oral Daily  . sodium chloride flush  3 mL Intravenous Q12H   Continuous Infusions: . sodium chloride       LOS: 10 days   Time spent: 40 minutes.   Mckinley Jewel, MD [facility]  If 7PM-7AM, please contact night-coverage [email or web address] 10/07/2020, 12:41 PM

## 2020-10-07 NOTE — TOC Transition Note (Addendum)
Transition of Care [facility]) - CM/SW Discharge Note   Patient Details  Name: IVANNAH ZODY MRN: [id number] Date of Birth: 01-25-1956  [facility] CM/SW Contact:  Magnus Ivan, LCSW Phone Number: 10/07/2020, 11:11 AM   Clinical Narrative:   Patient to discharge to [place] today. Updated MD, RN, and patient. Asked RN to call report. Patient to get rapid COVID test, will arrange EMS transport after COVID results are back. Informed patient of SNF's quarantine period due to patient being unvaccinated and she verbalized understanding. Medical Necessity Form and Face Sheet placed in Discharge Packet. CSW will call EMS for transport once COVID results are back and RN is ready.   12:05- CSW updated by Magda Paganini with [facility] that now she cannot take patient today, has to wait until tomorrow for bed to be available. Room 511. Magda Paganini reported COVID test from today will be good for tomorrow. CSW updated RN, MD, and patient. Asked Magda Paganini to call patient as well.     Final next level of care: Skilled Nursing Facility Barriers to Discharge: Barriers Resolved   Patient Goals and CMS Choice Patient states their goals for this hospitalization and ongoing recovery are:: SNF rehab CMS Medicare.gov Compare Post Acute Care list provided to:: Patient Choice offered to / list presented to : Patient  Discharge Placement              Patient chooses bed at: [facility] Patient to be transferred to facility by: EMS Name of family member notified: Patient notified. Patient and family notified of of transfer: 10/07/20  Discharge Plan and Services                                     Social Determinants of Health (SDOH) Interventions     Readmission Risk Interventions No flowsheet data found.

## 2020-10-07 NOTE — Plan of Care (Signed)

## 2020-10-07 NOTE — Discharge Summary (Addendum)
Physician Discharge Summary  Carmen [name] YQM:[id number] DOB: 1956-10-28 DOA: 09/26/2020  PCP: Cletis Athens, MD  Admit date: 09/26/2020 Discharge date: 10/07/2020  Admitted From: home Disposition:  SNF  Recommendations for Outpatient Follow-up:  1. Follow-up with PCP in 1 week 2. Repeat BMP and magnesium in 1 week 3. Follow-up with cardiology in 1 week  4. Follow-up with lymphedema clinic 5. Low-sodium diet   Home Health:none  Equipment/Devices:none Discharge Condition:stable CODE STATUS: full code  Diet recommendation: low sodium diet  Brief/Interim Summary: 64 year old female with history of chronic diastolic heart failure, morbid obesity, chronic respiratory failure on home O2 2 L history of endometrial cancer presenting with several day history of dyspnea on exertion. She's currently requiring 4 L by [place] and is being treated for a heart failure exacerbation.   Acute on chronic respiratory failure with hypoxia: -Secondary to underlying acute on chronic diastolic CHF. -Continued to require more than her baseline, needing 2-4 L (on 2 L at baseline) which is likely her new baseline. -CTA chest negative for acute PE.   -No evidence of infiltrate on chest x-ray or CTA  Acute on chronic diastolic CHF (congestive heart failure) ([place]) -On admission chest x-ray:cardiomegaly and pulmonary vascular congestion -Initially was started on Lasix 40 IV twice daily then was switched to Lasix 60 twice daily and then switched 40 p.o. twice daily  on 10/06/2020. She is net negative 19L  -continuedlisinopril, Coreg - CXR 11/6 with mild pulm edema, will continue diuresis - repeat CXR 11/7 - mild CHF, improved - repeat CXR 11/9 - mild vascular congestion without overt pulm edema -Echocardiogramwas overall a difficult study due to body habitus but shows normal LV systolic function. -Cardiologyinput appreciated -> recommended 40 mg PO lasix, increased lisinopril to 20 mg daily in addition  to coreg Patient will be discharged on Lasix 40 p.o. twice daily and lisinopril 20 mg daily as per cardiology recommendations.  Elevated troponin -Due to demand ischemia. No chest pain/MI - cardiology signed off.  Chronic lymphedema - wound care consulted-> appreciated woudn care recs -She may benefit fromlymphedema clinic-discussed about to follow-up with lymphedema clinic outpatient.  Morbid obesity with BMI of 60.0-69.9, adult ([place]) -Complicating factor to overall prognosis and care -Encourage diet modification/walking and weight loss.  History of endometrial carcinoma: Aware  Discharge Diagnoses:  Acute on chronic respiratory failure with hypoxia Acute on chronic diastolic CHF Elevated troponin Chronic lymphedema Morbid obesity with BMI of 60 History of endometrial carcinoma  Discharge Instructions  Discharge Instructions    Diet - low sodium heart healthy   Complete by: As directed    Discharge instructions   Complete by: As directed    Follow-up with PCP in 1 week Repeat BMP and magnesium in 1 week Follow-up with cardiology in 1 week  Follow-up with lymphedema clinic   Discharge wound care:   Complete by: As directed    As per wound care recommendations   Increase activity slowly   Complete by: As directed      Allergies as of 10/07/2020      Reactions   Clarithromycin    REACTION: respiratory issues, can't breathe   Penicillins    REACTION: can't breath      Medication List    STOP taking these medications   amLODipine 10 MG tablet Commonly known as: NORVASC   cloNIDine 0.1 MG tablet Commonly known as: CATAPRES     TAKE these medications   carvedilol 6.25 MG tablet Commonly known as: COREG Take 1  tablet (6.25 mg total) by mouth 2 (two) times daily with a meal. What changed:   medication strength  how much to take  when to take this   enalapril 20 MG tablet Commonly known as: VASOTEC Take 20 mg by mouth daily.   furosemide  40 MG tablet Commonly known as: LASIX Take 1 tablet (40 mg total) by mouth 2 (two) times daily. What changed:   how much to take  when to take this   MEGA MULTI WOMEN PO Take 1,200 mg by mouth daily.   potassium chloride 10 MEQ tablet Commonly known as: KLOR-CON TAKE 1 TABLET AT BEDTIME   vitamin E 180 MG (400 UNITS) capsule Take 400 Units by mouth 2 (two) times daily.            Durable Medical Equipment  (From admission, onward)         Start     Ordered   10/01/20 1048  For home use only DME standard manual wheelchair with seat cushion  Once       Comments: Patient suffers from heart failure which impairs their ability to perform daily activities like bathing, dressing, and toileting in the home.  A walker will not resolve issue with performing activities of daily living. A wheelchair will allow patient to safely perform daily activities. Patient can safely propel the wheelchair in the home or has a caregiver who can provide assistance. Length of need 12 months . Accessories: elevating leg rests (ELRs), wheel locks, extensions and anti-tippers.   10/01/20 1048   09/30/20 1234  For home use only DME Bedside commode  Once       Comments: 3 in 1  Question:  Patient needs a bedside commode to treat with the following condition  Answer:  Weakness   09/30/20 1233   09/30/20 1233  For home use only DME Walker rolling  Once       Question Answer Comment  Walker: With [place]   Patient needs a walker to treat with the following condition Weakness      09/30/20 1232           Discharge Care Instructions  (From admission, onward)         Start     Ordered   10/07/20 0000  Discharge wound care:       Comments: As per wound care recommendations   10/07/20 [place] Follow up on 10/13/2020.   Specialty: Cardiology Why: at 2:30pm. Enter through the [place]  entrance Contact information: [address] [place]             Allergies  Allergen Reactions  . Clarithromycin     REACTION: respiratory issues, can't breathe  . Penicillins     REACTION: can't breath    Consultations:  Cardiology   Procedures/Studies: CT Angio Chest PE W and/or Wo Contrast  Result Date: 09/27/2020 CLINICAL DATA:  Elevated D-dimer. EXAM: CT ANGIOGRAPHY CHEST WITH CONTRAST TECHNIQUE: Multidetector CT imaging of the chest was performed using the standard protocol during bolus administration of intravenous contrast. Multiplanar CT image reconstructions and MIPs were obtained to evaluate the vascular anatomy. CONTRAST:  124mL OMNIPAQUE IOHEXOL 350 MG/ML SOLN COMPARISON:  None. FINDINGS: Cardiovascular: There is moderate severity calcification of the aortic arch. Satisfactory opacification of the pulmonary arteries to the segmental  level. No evidence of pulmonary embolism. Normal heart size. No pericardial effusion. Mediastinum/Nodes: There is mild right hilar and AP window lymphadenopathy. Thyroid gland, trachea, and esophagus demonstrate no significant findings. Lungs/Pleura: A 6 mm noncalcified lung nodule versus focal scar seen within the posterior aspect of the left apex. An 8 mm noncalcified lung nodule is seen within the anterolateral aspect of the right upper lobe. A 6 mm posterior right upper lobe noncalcified lung nodule is seen. Adjacent 6 mm noncalcified lung nodules are seen along the posterolateral aspect of the right lower lobe. There is no evidence of acute infiltrate, pleural effusion or pneumothorax. Upper Abdomen: No acute abnormality. Musculoskeletal: No chest wall abnormality. No acute or significant osseous findings. Review of the MIP images confirms the above findings. IMPRESSION: 1. No evidence of pulmonary embolism. 2. Multiple bilateral noncalcified lung nodules. Correlation with follow-up chest  CT is recommended in 3-6 months. 3. Aortic atherosclerosis. Aortic Atherosclerosis (ICD10-I70.0). Electronically Signed   By: Virgina Norfolk M.D.   On: 09/27/2020 01:28   DG Chest Port 1 View  Result Date: 10/05/2020 CLINICAL DATA:  Shortness of breath. EXAM: PORTABLE CHEST 1 VIEW COMPARISON:  10/03/2020 FINDINGS: The heart is mildly enlarged but stable. Mild vascular congestion without overt pulmonary edema. No definite infiltrates or effusions. IMPRESSION: Cardiac enlargement and mild vascular congestion without overt pulmonary edema. Electronically Signed   By: Marijo Sanes M.D.   On: 10/05/2020 07:53   DG Chest Port 1 View  Result Date: 10/03/2020 CLINICAL DATA:  Dyspnea, CHF EXAM: PORTABLE CHEST 1 VIEW COMPARISON:  Chest radiograph from one day prior. FINDINGS: Stable cardiomediastinal silhouette with mild cardiomegaly. No pneumothorax. No pleural effusion. Borderline mild pulmonary edema, improved. No consolidative airspace disease. IMPRESSION: Borderline mild congestive heart failure, improved. Electronically Signed   By: Ilona Sorrel M.D.   On: 10/03/2020 15:02   DG Chest Port 1 View  Result Date: 10/02/2020 CLINICAL DATA:  Shortness of breath, acute on chronic diastolic heart failure, morbid obesity, chronic respiratory failure, history of endometrial cancer EXAM: PORTABLE CHEST 1 VIEW COMPARISON:  Portable exam 0651 hours compared to 09/26/2020 FINDINGS: Enlargement of cardiac silhouette with pulmonary vascular congestion. Scattered interstitial infiltrate consistent with mild pulmonary edema and CHF. Atherosclerotic calcification and tortuosity of thoracic aorta. No pleural effusion or pneumothorax. Osseous structures unremarkable. IMPRESSION: Enlargement of cardiac silhouette with pulmonary vascular congestion and mild pulmonary edema. Electronically Signed   By: Lavonia Dana M.D.   On: 10/02/2020 12:32   DG Chest Portable 1 View  Result Date: 09/26/2020 CLINICAL DATA:  Shortness of  breath. EXAM: PORTABLE CHEST 1 VIEW COMPARISON:  March 12, 2014 FINDINGS: There is no evidence of acute infiltrate, pleural effusion or pneumothorax. Mild to moderate severity prominence of the perihilar pulmonary vasculature is seen. The cardiac silhouette is mildly enlarged. The visualized skeletal structures are unremarkable. IMPRESSION: Cardiomegaly with mild to moderate severity pulmonary vascular congestion. Electronically Signed   By: Virgina Norfolk M.D.   On: 09/26/2020 22:10   ECHOCARDIOGRAM COMPLETE  Result Date: 09/27/2020    ECHOCARDIOGRAM REPORT   Patient Name:   THERESA WEDEL Date of Exam: 09/27/2020 Medical Rec #:  [id number]       Height:       62.0 in Accession #:    [id number]      Weight:       345.5 lb Date of Birth:  Jan 08, 1956       BSA:          2.412  m Patient Age:    89 years        BP:           118/60 mmHg Patient Gender: F               HR:           64 bpm. Exam Location:  [facility] Procedure: 2D Echo, Cardiac Doppler and Color Doppler Indications:     CHF- acute diastolic 259.56  History:         Patient has no prior history of Echocardiogram examinations.                  CHF.  Sonographer:     Sherrie Sport RDCS (AE) Referring Phys:  [id number] Athena Masse Diagnosing Phys: Kathlyn Sacramento MD  Sonographer Comments: No apical window, no subcostal window and Technically challenging study due to limited acoustic windows. IMPRESSIONS  1. Left ventricular ejection fraction, by estimation, is 55 to 60%. The left ventricle has normal function. Left ventricular endocardial border not optimally defined to evaluate regional wall motion. Left ventricular diastolic function could not be evaluated.  2. Right ventricular systolic function is normal. The right ventricular size is normal.  3. Left atrial size was moderately dilated.  4. The mitral valve is normal in structure. No evidence of mitral valve regurgitation. No evidence of mitral stenosis.  5. The aortic valve is normal in structure. Aortic  valve regurgitation is not visualized. Mild to moderate aortic valve sclerosis/calcification is present, without any evidence of aortic stenosis.  6. Very challenging image quality with limited views. FINDINGS  Left Ventricle: Left ventricular ejection fraction, by estimation, is 55 to 60%. The left ventricle has normal function. Left ventricular endocardial border not optimally defined to evaluate regional wall motion. The left ventricular internal cavity size was normal in size. There is no left ventricular hypertrophy. Left ventricular diastolic function could not be evaluated. Right Ventricle: The right ventricular size is normal. No increase in right ventricular wall thickness. Right ventricular systolic function is normal. Left Atrium: Left atrial size was moderately dilated. Right Atrium: Right atrial size was normal in size. Pericardium: There is no evidence of pericardial effusion. Mitral Valve: The mitral valve is normal in structure. No evidence of mitral valve regurgitation. No evidence of mitral valve stenosis. Tricuspid Valve: The tricuspid valve is normal in structure. Tricuspid valve regurgitation is trivial. No evidence of tricuspid stenosis. Aortic Valve: The aortic valve is normal in structure. Aortic valve regurgitation is not visualized. Mild to moderate aortic valve sclerosis/calcification is present, without any evidence of aortic stenosis. Pulmonic Valve: The pulmonic valve was normal in structure. Pulmonic valve regurgitation is not visualized. No evidence of pulmonic stenosis. Aorta: The aortic root is normal in size and structure. Venous: The inferior vena cava was not well visualized. IAS/Shunts: No atrial level shunt detected by color flow Doppler.  LEFT VENTRICLE PLAX 2D LVIDd:         4.22 cm LVIDs:         2.75 cm LV PW:         1.14 cm LV IVS:        0.99 cm LVOT diam:     2.00 cm LVOT Area:     3.14 cm  LEFT ATRIUM         Index LA diam:    4.50 cm 1.87 cm/m  PULMONIC VALVE AORTA                 PV Vmax:        0.92 m/s Ao Root diam: 2.40 cm PV Peak grad:   3.4 mmHg                       RVOT Peak grad: 16 mmHg   SHUNTS Systemic Diam: 2.00 cm Kathlyn Sacramento MD Electronically signed by Kathlyn Sacramento MD Signature Date/Time: 09/27/2020/12:38:09 PM    Final       Subjective: Patient seen and examined.  Tells me that she is doing well and waiting for the bed at SNF.  Denies shortness of breath, chest pain, palpitation, orthopnea or PND.  Tells me that ace wraps helps with her lymphedema.  Discharge Exam: Vitals:   10/07/20 0458 10/07/20 0743  BP: (!) 124/56 114/72  Pulse: (!) 55 (!) 57  Resp: 20 16  Temp: 97.9 F (36.6 C) 97.9 F (36.6 C)  SpO2: 94% 99%   Vitals:   10/06/20 1945 10/07/20 0017 10/07/20 0458 10/07/20 0743  BP: 110/70 (!) 114/52 (!) 124/56 114/72  Pulse: (!) 57 82 (!) 55 (!) 57  Resp: 20 20 20 16   Temp: 97.9 F (36.6 C) 98.2 F (36.8 C) 97.9 F (36.6 C) 97.9 F (36.6 C)  TempSrc: Oral Oral    SpO2: 97% 92% 94% 99%  Weight:      Height:        General: Pt is alert, awake, not in acute distress, on 3 L of oxygen via nasal cannula.  Morbidly obese. Cardiovascular: RRR, S1/S2 +, no rubs, no gallops Respiratory: CTA bilaterally, no wheezing, no rhonchi Abdominal: Soft, NT, ND, bowel sounds + Extremities: Bilateral chronic lymphedema noted.   The results of significant diagnostics from this hospitalization (including imaging, microbiology, ancillary and laboratory) are listed below for reference.     Microbiology: No results found for this or any previous visit (from the past 240 hour(s)).   Labs: BNP (last 3 results) Recent Labs    09/26/20 2213 10/03/20 0444  BNP 123.2* 02.5   Basic Metabolic Panel: Recent Labs  Lab 10/02/20 0520 10/03/20 0444 10/04/20 0447 10/05/20 0534 10/06/20 0523  NA 142 143 141 142 142  K 3.7 3.6 3.6 3.6 3.6  CL 100 99 98 97* 99  CO2 34* 36* 34* 33* 33*  GLUCOSE 115* 128* 124*  119* 121*  BUN 20 23 25* 34* 36*  CREATININE 0.62 0.64 0.66 0.91 0.68  CALCIUM 8.9 8.7* 8.7* 8.6* 8.9  MG 2.3 2.3 2.2 2.3 2.2  PHOS 4.6 4.6 5.0* 5.4* 4.3   Liver Function Tests: Recent Labs  Lab 10/02/20 0520 10/03/20 0444 10/04/20 0447 10/05/20 0534 10/06/20 0523  AST 20 23 23 24 21   ALT 19 22 25 24 23   ALKPHOS 70 74 68 69 66  BILITOT 0.7 0.8 0.7 0.8 0.7  PROT 7.3 7.5 7.2 7.7 7.6  ALBUMIN 3.3* 3.3* 3.1* 3.5 3.3*   No results for input(s): LIPASE, AMYLASE in the last 168 hours. No results for input(s): AMMONIA in the last 168 hours. CBC: Recent Labs  Lab 10/02/20 0520 10/03/20 0444 10/04/20 0447 10/05/20 0534 10/06/20 0523  WBC 6.7 5.8 6.1 6.9 6.2  NEUTROABS 4.8 4.0 4.2 5.1 4.6  HGB 12.6 12.7 13.0 13.3 13.1  HCT 39.8 39.8 40.8 41.8 41.2  MCV 95.0 97.1 96.2 95.7 96.5  PLT 173 162 162 181 187  Cardiac Enzymes: No results for input(s): CKTOTAL, CKMB, CKMBINDEX, TROPONINI in the last 168 hours. BNP: Invalid input(s): POCBNP CBG: No results for input(s): GLUCAP in the last 168 hours. D-Dimer No results for input(s): DDIMER in the last 72 hours. Hgb A1c No results for input(s): HGBA1C in the last 72 hours. Lipid Profile No results for input(s): CHOL, HDL, LDLCALC, TRIG, CHOLHDL, LDLDIRECT in the last 72 hours. Thyroid function studies No results for input(s): TSH, T4TOTAL, T3FREE, THYROIDAB in the last 72 hours.  Invalid input(s): FREET3 Anemia work up No results for input(s): VITAMINB12, FOLATE, FERRITIN, TIBC, IRON, RETICCTPCT in the last 72 hours. Urinalysis    Component Value Date/Time   COLORURINE YELLOW 01/19/2011 1500   APPEARANCEUR CLEAR 01/19/2011 1500   LABSPEC 1.006 01/19/2011 1500   PHURINE 7.0 01/19/2011 1500   GLUCOSEU NEGATIVE 10/15/2010 2032   HGBUR LARGE (A) 01/19/2011 1500   BILIRUBINUR NEGATIVE 01/19/2011 1500   KETONESUR NEGATIVE 01/19/2011 1500   PROTEINUR NEGATIVE 01/19/2011 1500   UROBILINOGEN 0.2 01/19/2011 1500   NITRITE  NEGATIVE 01/19/2011 1500   LEUKOCYTESUR NEGATIVE 01/19/2011 1500   Sepsis Labs Invalid input(s): PROCALCITONIN,  WBC,  LACTICIDVEN Microbiology No results found for this or any previous visit (from the past 240 hour(s)).   Time coordinating discharge: Over 30 minutes  SIGNED:   Mckinley Jewel, MD  [facility] 10/07/2020, 10:04 AM Pager   If 7PM-7AM, please contact night-coverage [email or web address]

## 2020-10-08 DIAGNOSIS — G479 Sleep disorder, unspecified: Secondary | ICD-10-CM | POA: Diagnosis not present

## 2020-10-08 DIAGNOSIS — R5383 Other fatigue: Secondary | ICD-10-CM | POA: Diagnosis not present

## 2020-10-08 DIAGNOSIS — I248 Other forms of acute ischemic heart disease: Secondary | ICD-10-CM | POA: Diagnosis not present

## 2020-10-08 DIAGNOSIS — J181 Lobar pneumonia, unspecified organism: Secondary | ICD-10-CM | POA: Diagnosis not present

## 2020-10-08 DIAGNOSIS — J9602 Acute respiratory failure with hypercapnia: Secondary | ICD-10-CM | POA: Diagnosis not present

## 2020-10-08 DIAGNOSIS — R059 Cough, unspecified: Secondary | ICD-10-CM | POA: Diagnosis not present

## 2020-10-08 DIAGNOSIS — I480 Paroxysmal atrial fibrillation: Secondary | ICD-10-CM | POA: Diagnosis present

## 2020-10-08 DIAGNOSIS — J90 Pleural effusion, not elsewhere classified: Secondary | ICD-10-CM | POA: Diagnosis not present

## 2020-10-08 DIAGNOSIS — R410 Disorientation, unspecified: Secondary | ICD-10-CM | POA: Diagnosis not present

## 2020-10-08 DIAGNOSIS — N390 Urinary tract infection, site not specified: Secondary | ICD-10-CM | POA: Diagnosis present

## 2020-10-08 DIAGNOSIS — I890 Lymphedema, not elsewhere classified: Secondary | ICD-10-CM | POA: Diagnosis not present

## 2020-10-08 DIAGNOSIS — Z20822 Contact with and (suspected) exposure to covid-19: Secondary | ICD-10-CM | POA: Diagnosis not present

## 2020-10-08 DIAGNOSIS — R6520 Severe sepsis without septic shock: Secondary | ICD-10-CM | POA: Diagnosis not present

## 2020-10-08 DIAGNOSIS — Z79899 Other long term (current) drug therapy: Secondary | ICD-10-CM | POA: Diagnosis not present

## 2020-10-08 DIAGNOSIS — R6521 Severe sepsis with septic shock: Secondary | ICD-10-CM | POA: Diagnosis not present

## 2020-10-08 DIAGNOSIS — Z6841 Body Mass Index (BMI) 40.0 and over, adult: Secondary | ICD-10-CM | POA: Diagnosis not present

## 2020-10-08 DIAGNOSIS — I517 Cardiomegaly: Secondary | ICD-10-CM | POA: Diagnosis not present

## 2020-10-08 DIAGNOSIS — J9621 Acute and chronic respiratory failure with hypoxia: Secondary | ICD-10-CM | POA: Diagnosis not present

## 2020-10-08 DIAGNOSIS — J9601 Acute respiratory failure with hypoxia: Secondary | ICD-10-CM | POA: Diagnosis not present

## 2020-10-08 DIAGNOSIS — F419 Anxiety disorder, unspecified: Secondary | ICD-10-CM | POA: Diagnosis present

## 2020-10-08 DIAGNOSIS — N184 Chronic kidney disease, stage 4 (severe): Secondary | ICD-10-CM | POA: Diagnosis not present

## 2020-10-08 DIAGNOSIS — I5033 Acute on chronic diastolic (congestive) heart failure: Secondary | ICD-10-CM | POA: Diagnosis not present

## 2020-10-08 DIAGNOSIS — R0689 Other abnormalities of breathing: Secondary | ICD-10-CM | POA: Diagnosis not present

## 2020-10-08 DIAGNOSIS — A419 Sepsis, unspecified organism: Secondary | ICD-10-CM | POA: Diagnosis not present

## 2020-10-08 DIAGNOSIS — R0902 Hypoxemia: Secondary | ICD-10-CM | POA: Diagnosis not present

## 2020-10-08 DIAGNOSIS — I110 Hypertensive heart disease with heart failure: Secondary | ICD-10-CM | POA: Diagnosis not present

## 2020-10-08 DIAGNOSIS — Z8542 Personal history of malignant neoplasm of other parts of uterus: Secondary | ICD-10-CM | POA: Diagnosis not present

## 2020-10-08 DIAGNOSIS — I130 Hypertensive heart and chronic kidney disease with heart failure and stage 1 through stage 4 chronic kidney disease, or unspecified chronic kidney disease: Secondary | ICD-10-CM | POA: Diagnosis not present

## 2020-10-08 DIAGNOSIS — N179 Acute kidney failure, unspecified: Secondary | ICD-10-CM | POA: Diagnosis present

## 2020-10-08 DIAGNOSIS — J9611 Chronic respiratory failure with hypoxia: Secondary | ICD-10-CM | POA: Diagnosis not present

## 2020-10-08 DIAGNOSIS — R778 Other specified abnormalities of plasma proteins: Secondary | ICD-10-CM | POA: Diagnosis not present

## 2020-10-08 DIAGNOSIS — I4891 Unspecified atrial fibrillation: Secondary | ICD-10-CM | POA: Diagnosis not present

## 2020-10-08 DIAGNOSIS — J9622 Acute and chronic respiratory failure with hypercapnia: Secondary | ICD-10-CM | POA: Diagnosis not present

## 2020-10-08 DIAGNOSIS — M170 Bilateral primary osteoarthritis of knee: Secondary | ICD-10-CM | POA: Diagnosis not present

## 2020-10-08 DIAGNOSIS — R0602 Shortness of breath: Secondary | ICD-10-CM | POA: Diagnosis not present

## 2020-10-08 DIAGNOSIS — F22 Delusional disorders: Secondary | ICD-10-CM | POA: Diagnosis present

## 2020-10-08 DIAGNOSIS — J3489 Other specified disorders of nose and nasal sinuses: Secondary | ICD-10-CM | POA: Diagnosis not present

## 2020-10-08 DIAGNOSIS — G9341 Metabolic encephalopathy: Secondary | ICD-10-CM | POA: Diagnosis not present

## 2020-10-08 DIAGNOSIS — Z9981 Dependence on supplemental oxygen: Secondary | ICD-10-CM | POA: Diagnosis not present

## 2020-10-08 DIAGNOSIS — D6959 Other secondary thrombocytopenia: Secondary | ICD-10-CM | POA: Diagnosis present

## 2020-10-08 DIAGNOSIS — G4700 Insomnia, unspecified: Secondary | ICD-10-CM | POA: Diagnosis not present

## 2020-10-08 DIAGNOSIS — I5032 Chronic diastolic (congestive) heart failure: Secondary | ICD-10-CM | POA: Diagnosis not present

## 2020-10-08 DIAGNOSIS — Z880 Allergy status to penicillin: Secondary | ICD-10-CM | POA: Diagnosis not present

## 2020-10-08 DIAGNOSIS — D696 Thrombocytopenia, unspecified: Secondary | ICD-10-CM | POA: Diagnosis not present

## 2020-10-08 DIAGNOSIS — R4182 Altered mental status, unspecified: Secondary | ICD-10-CM | POA: Diagnosis not present

## 2020-10-08 NOTE — TOC Transition Note (Addendum)
Transition of Care [facility]) - CM/SW Discharge Note   Patient Details  Name: Carmen [name] MRN: [id number] Date of Birth: [birth year]  [facility] CM/SW Contact:  Magnus Ivan, LCSW Phone Number: 10/08/2020, 10:53 AM   Clinical Narrative:   Patient to discharge to [facility] today. Patient is aware. CSW updated RN and MD and asked RN to call report. Medical Necessity Form and Face Sheet placed in [place]. Will call EMS when RN is ready.  12:08: [facility] arranged for 2:30 pick up. RN aware.  3:00- [facility] unable to transport due to weight. Transport arranged through [employer]. They are aware patient has oxygen. She reported it will be a while because they are very busy, did not know where patient was on their list, updated RN and [facility].    Final next level of care: Skilled Nursing Facility Barriers to Discharge: Barriers Resolved   Patient Goals and CMS Choice Patient states their goals for this hospitalization and ongoing recovery are:: SNF rehab CMS Medicare.gov Compare Post Acute Care list provided to:: Patient Choice offered to / list presented to : Patient  Discharge Placement              Patient chooses bed at: [facility] Patient to be transferred to facility by: EMS Name of family member notified: Patient aware. Patient and family notified of of transfer: 10/08/20  Discharge Plan and Services                                     Social Determinants of Health (SDOH) Interventions     Readmission Risk Interventions No flowsheet data found.

## 2020-10-08 NOTE — Discharge Instructions (Signed)

## 2020-10-08 NOTE — TOC Progression Note (Addendum)
Transition of Care [facility]) - Progression Note    Patient Details  Name: Carmen [name] MRN: [id number] Date of Birth: December 03, 1955  Transition of Care [facility]) CM/SW [place], LCSW Phone Number: 10/08/2020, 10:01 AM  Clinical Narrative:   Reached out to [facility] with [facility] to confirm they can accept patient today, waiting for confirmation.     Expected Discharge Plan: [place] Barriers to Discharge: Barriers Resolved  Expected Discharge Plan and Services Expected Discharge Plan: [place] arrangements for the past 2 months: Single Family Home Expected Discharge Date: 10/07/20                                     Social Determinants of Health (SDOH) Interventions    Readmission Risk Interventions No flowsheet data found.

## 2020-10-08 NOTE — Progress Notes (Signed)
Called report to [facility] and spoke to [place], [employer] She is resting in bed. Will go in and remove IVs to arms. Gave discharge instructions. Notified CSW and she called EMS, expected arrival is 1430. Call bell in reach, bed in low position.

## 2020-10-11 DIAGNOSIS — I5032 Chronic diastolic (congestive) heart failure: Secondary | ICD-10-CM | POA: Diagnosis not present

## 2020-10-11 DIAGNOSIS — J9611 Chronic respiratory failure with hypoxia: Secondary | ICD-10-CM | POA: Diagnosis not present

## 2020-10-13 ENCOUNTER — Ambulatory Visit: Payer: Self-pay | Admitting: Family

## 2020-10-13 NOTE — Progress Notes (Signed)
Patient ID: Carmen [name], female    DOB: January 02, 1956, 64 y.o.   MRN: [id number]  HPI  Carmen [name] is a 64 y/o female with a history of endometrial cancer, HTN, obesity, lymphedema and chronic heart failure.   Echo report from 09/27/20 reviewed and showed an EF of 55-60% along with moderate LAE.   Admitted 09/26/20 due to acute on chronic HF. Cardiology and wound care consults obtained. O2 requirement increased from 2L to 4L. Chest CTA negative for PE. Initially needed IV lasix with transition to oral diuretics with net loss of 19L. Elevated troponin thought to be due to demand ischemia. Discharged after 12 days.   She presents today for her initial visit with a chief complaint of minimal fatigue upon moderate exertion. She describes this as chronic in nature having been present for several years. She has associated cough, head congestion, rhinorrhea, lymphedema and difficulty sleeping along with this. She denies dizziness, abdominal distention, palpitations, chest pain or shortness of breath.   Currently receiving physical therapy at [facility] and is unsure of how long she will be there. Has both lower legs wrapped and she says that they get re-wrapped daily.   Past Medical History:  Diagnosis Date  . CHF (congestive heart failure) ([place])   . Endometrial cancer (HCC)    Grade 1  . Hypertension   . Lymphedema   . Morbid obesity ([place])   . Osteoarthritis of both knees    Past Surgical History:  Procedure Laterality Date  . ROBOTIC ASSISTED LAP VAGINAL HYSTERECTOMY  01/10/2011   BSO   History reviewed. No pertinent family history. Social History   Tobacco Use  . Smoking status: Never Smoker  . Smokeless tobacco: Never Used  Substance Use Topics  . Alcohol use: No   Allergies  Allergen Reactions  . Clarithromycin     REACTION: respiratory issues, can't breathe  . Penicillins     REACTION: can't breath   Prior to Admission medications   Medication Sig Start Date End Date  Taking? Authorizing Provider  carvedilol (COREG) 6.25 MG tablet Take 1 tablet (6.25 mg total) by mouth 2 (two) times daily with a meal. Patient taking differently: Take 6.25 mg by mouth daily.  10/07/20  Yes Pahwani, Rinka R, MD  enalapril (VASOTEC) 20 MG tablet Take 20 mg by mouth daily. 07/21/20  Yes [provider]  fexofenadine (ALLEGRA) 180 MG tablet Take 180 mg by mouth daily.   Yes [provider]  furosemide (LASIX) 40 MG tablet Take 1 tablet (40 mg total) by mouth 2 (two) times daily. 10/07/20  Yes Pahwani, Rinka R, MD  Multiple Vitamins-Minerals (MEGA MULTI WOMEN PO) Take 1,200 mg by mouth daily.   Yes [provider]  potassium chloride (KLOR-CON) 10 MEQ tablet TAKE 1 TABLET AT BEDTIME 07/05/20  Yes Masoud, Viann Shove, MD  vitamin E 400 UNIT capsule Take 400 Units by mouth 2 (two) times daily.   Yes [provider]    Review of Systems  Constitutional: Positive for fatigue (minimal). Negative for appetite change.  HENT: Positive for congestion and rhinorrhea. Negative for sore throat.   Eyes: Negative.   Respiratory: Positive for cough (at times). Negative for chest tightness and shortness of breath.   Cardiovascular: Positive for leg swelling. Negative for chest pain and palpitations.  Gastrointestinal: Negative for abdominal distention and abdominal pain.  Endocrine: Negative.   Genitourinary: Negative.   Musculoskeletal: Negative for back pain and neck pain.  Skin: Negative.  Allergic/Immunologic: Negative.   Neurological: Negative for dizziness and light-headedness.  Hematological: Negative for adenopathy. Does not bruise/bleed easily.  Psychiatric/Behavioral: Positive for sleep disturbance (wearing oxygen @ 2L). Negative for dysphoric mood. The patient is not nervous/anxious.    Vitals:   10/14/20 1053  BP: (!) 146/71  Pulse: 61  Resp: 18  SpO2: 91%  Weight: 294 lb (133.4 kg)  Height: 5\' 1"  (1.549 m)   Wt Readings from Last 3  Encounters:  10/14/20 294 lb (133.4 kg)  10/08/20 (!) 300 lb 6.4 oz (136.3 kg)  05/17/16 (!) 306 lb (138.8 kg)   Lab Results  Component Value Date   CREATININE 0.94 10/07/2020   CREATININE 0.68 10/06/2020   CREATININE 0.91 10/05/2020   Physical Exam Vitals and nursing note reviewed.  Constitutional:      Appearance: Normal appearance.  HENT:     Head: Normocephalic and atraumatic.  Cardiovascular:     Rate and Rhythm: Normal rate and regular rhythm.  Pulmonary:     Effort: Pulmonary effort is normal.     Breath sounds: Normal breath sounds.  Abdominal:     General: There is no distension.     Palpations: Abdomen is soft.     Tenderness: There is no abdominal tenderness.  Musculoskeletal:        General: No tenderness.     Cervical back: Normal range of motion and neck supple.     Right lower leg: Edema (lower leg wrapped) present.     Left lower leg: Edema (lower leg wrapped) present.  Skin:    General: Skin is warm and dry.  Neurological:     General: No focal deficit present.     Mental Status: She is alert and oriented to person, place, and time.  Psychiatric:        Mood and Affect: Mood normal.        Behavior: Behavior normal.        Thought Content: Thought content normal.    Assessment & Plan:  1: Chronic heart failure with preserved ejection fraction with structural changes (LAE)- - NYHA II - euvolemic today although difficult to assess accurately due to body habitus - being weighed at [facility] although not daily; to begin weighing daily so that she can call for an overnight weight gain of > 2 pounds or a weekly weight gain of >5 pounds - not adding salt and said she hasn't used salt in "years"; has been reading food labels for sodium content although now she is eating whatever they bring her  - stated weight today was 294 - BNP 10/03/20 was 73.8 - has not gotten her flu vaccine yet  2: HTN- - BP looks good today - currently seeing PCP at  facility; normally sees Dr. Lavera Guise - BMP 10/07/20 reviewed and showed sodium 140, potassium 4.0, creatinine 0.94 and GFR >60  3: Lymphedema- - both lower legs are currently wrapped in ACE wraps and she says that they get re-wrapped daily - trying to elevate her legs - discussed possible referral to lymphedema clinic once she gets home.   Facility medication list was reviewed.   Return in 2 months or sooner for any questions/problems before then. Patient says that she will call back to schedule this as she doesn't know what her husband's work schedule will be. She gives assurance that she will call back.

## 2020-10-14 ENCOUNTER — Encounter: Payer: Self-pay | Admitting: Family

## 2020-10-14 ENCOUNTER — Other Ambulatory Visit: Payer: Self-pay

## 2020-10-14 ENCOUNTER — Ambulatory Visit: Payer: Medicare HMO | Attending: Family | Admitting: Family

## 2020-10-14 VITALS — BP 146/71 | HR 61 | Resp 18 | Ht 61.0 in | Wt 294.0 lb

## 2020-10-14 DIAGNOSIS — I10 Essential (primary) hypertension: Secondary | ICD-10-CM

## 2020-10-14 DIAGNOSIS — J3489 Other specified disorders of nose and nasal sinuses: Secondary | ICD-10-CM | POA: Diagnosis not present

## 2020-10-14 DIAGNOSIS — R5383 Other fatigue: Secondary | ICD-10-CM | POA: Diagnosis not present

## 2020-10-14 DIAGNOSIS — R059 Cough, unspecified: Secondary | ICD-10-CM | POA: Insufficient documentation

## 2020-10-14 DIAGNOSIS — Z880 Allergy status to penicillin: Secondary | ICD-10-CM | POA: Insufficient documentation

## 2020-10-14 DIAGNOSIS — Z79899 Other long term (current) drug therapy: Secondary | ICD-10-CM | POA: Insufficient documentation

## 2020-10-14 DIAGNOSIS — G479 Sleep disorder, unspecified: Secondary | ICD-10-CM | POA: Insufficient documentation

## 2020-10-14 DIAGNOSIS — I890 Lymphedema, not elsewhere classified: Secondary | ICD-10-CM | POA: Diagnosis not present

## 2020-10-14 DIAGNOSIS — I110 Hypertensive heart disease with heart failure: Secondary | ICD-10-CM | POA: Diagnosis not present

## 2020-10-14 DIAGNOSIS — I5032 Chronic diastolic (congestive) heart failure: Secondary | ICD-10-CM | POA: Diagnosis not present

## 2020-10-14 NOTE — Patient Instructions (Addendum)
Begin weighing daily and call for an overnight weight gain of > 2 pounds or a weekly weight gain of >5 pounds.   Call us after you get home to schedule an appointment in January

## 2020-10-18 ENCOUNTER — Other Ambulatory Visit: Payer: Self-pay | Admitting: *Deleted

## 2020-10-18 MED ORDER — CLONIDINE HCL 0.1 MG PO TABS: 0.1000 mg | ORAL_TABLET | Freq: Every day | ORAL | 3 refills | Status: DC

## 2020-10-24 ENCOUNTER — Other Ambulatory Visit: Payer: Self-pay

## 2020-10-24 ENCOUNTER — Emergency Department: Payer: Medicare HMO

## 2020-10-24 ENCOUNTER — Inpatient Hospital Stay
Admission: EM | Admit: 2020-10-24 | Discharge: 2020-11-01 | DRG: 871 | Disposition: A | Discharge status: Home Health | Payer: Medicare HMO | Source: Skilled Nursing Facility | Attending: Internal Medicine | Admitting: Internal Medicine

## 2020-10-24 DIAGNOSIS — I517 Cardiomegaly: Secondary | ICD-10-CM | POA: Diagnosis not present

## 2020-10-24 DIAGNOSIS — Z9981 Dependence on supplemental oxygen: Secondary | ICD-10-CM

## 2020-10-24 DIAGNOSIS — F22 Delusional disorders: Secondary | ICD-10-CM | POA: Diagnosis present

## 2020-10-24 DIAGNOSIS — J9602 Acute respiratory failure with hypercapnia: Secondary | ICD-10-CM | POA: Diagnosis not present

## 2020-10-24 DIAGNOSIS — R0902 Hypoxemia: Secondary | ICD-10-CM | POA: Diagnosis not present

## 2020-10-24 DIAGNOSIS — R6520 Severe sepsis without septic shock: Secondary | ICD-10-CM

## 2020-10-24 DIAGNOSIS — I5032 Chronic diastolic (congestive) heart failure: Secondary | ICD-10-CM | POA: Diagnosis not present

## 2020-10-24 DIAGNOSIS — I4891 Unspecified atrial fibrillation: Secondary | ICD-10-CM | POA: Diagnosis not present

## 2020-10-24 DIAGNOSIS — I5033 Acute on chronic diastolic (congestive) heart failure: Secondary | ICD-10-CM | POA: Diagnosis not present

## 2020-10-24 DIAGNOSIS — A419 Sepsis, unspecified organism: Principal | ICD-10-CM | POA: Diagnosis present

## 2020-10-24 DIAGNOSIS — I110 Hypertensive heart disease with heart failure: Secondary | ICD-10-CM | POA: Diagnosis not present

## 2020-10-24 DIAGNOSIS — G4700 Insomnia, unspecified: Secondary | ICD-10-CM | POA: Diagnosis not present

## 2020-10-24 DIAGNOSIS — J9622 Acute and chronic respiratory failure with hypercapnia: Secondary | ICD-10-CM | POA: Diagnosis not present

## 2020-10-24 DIAGNOSIS — J9601 Acute respiratory failure with hypoxia: Secondary | ICD-10-CM | POA: Diagnosis not present

## 2020-10-24 DIAGNOSIS — J181 Lobar pneumonia, unspecified organism: Secondary | ICD-10-CM

## 2020-10-24 DIAGNOSIS — Z6841 Body Mass Index (BMI) 40.0 and over, adult: Secondary | ICD-10-CM

## 2020-10-24 DIAGNOSIS — D696 Thrombocytopenia, unspecified: Secondary | ICD-10-CM

## 2020-10-24 DIAGNOSIS — I890 Lymphedema, not elsewhere classified: Secondary | ICD-10-CM

## 2020-10-24 DIAGNOSIS — I130 Hypertensive heart and chronic kidney disease with heart failure and stage 1 through stage 4 chronic kidney disease, or unspecified chronic kidney disease: Secondary | ICD-10-CM

## 2020-10-24 DIAGNOSIS — R0602 Shortness of breath: Secondary | ICD-10-CM | POA: Diagnosis not present

## 2020-10-24 DIAGNOSIS — G9341 Metabolic encephalopathy: Secondary | ICD-10-CM | POA: Diagnosis not present

## 2020-10-24 DIAGNOSIS — D72829 Elevated white blood cell count, unspecified: Secondary | ICD-10-CM

## 2020-10-24 DIAGNOSIS — R410 Disorientation, unspecified: Secondary | ICD-10-CM | POA: Diagnosis not present

## 2020-10-24 DIAGNOSIS — I480 Paroxysmal atrial fibrillation: Secondary | ICD-10-CM

## 2020-10-24 DIAGNOSIS — N390 Urinary tract infection, site not specified: Secondary | ICD-10-CM | POA: Diagnosis present

## 2020-10-24 DIAGNOSIS — J811 Chronic pulmonary edema: Secondary | ICD-10-CM | POA: Diagnosis not present

## 2020-10-24 DIAGNOSIS — I509 Heart failure, unspecified: Secondary | ICD-10-CM

## 2020-10-24 DIAGNOSIS — F419 Anxiety disorder, unspecified: Secondary | ICD-10-CM | POA: Diagnosis present

## 2020-10-24 DIAGNOSIS — I248 Other forms of acute ischemic heart disease: Secondary | ICD-10-CM | POA: Diagnosis present

## 2020-10-24 DIAGNOSIS — R0689 Other abnormalities of breathing: Secondary | ICD-10-CM | POA: Diagnosis not present

## 2020-10-24 DIAGNOSIS — R2689 Other abnormalities of gait and mobility: Secondary | ICD-10-CM | POA: Diagnosis not present

## 2020-10-24 DIAGNOSIS — N179 Acute kidney failure, unspecified: Secondary | ICD-10-CM | POA: Diagnosis present

## 2020-10-24 DIAGNOSIS — J9690 Respiratory failure, unspecified, unspecified whether with hypoxia or hypercapnia: Secondary | ICD-10-CM | POA: Diagnosis not present

## 2020-10-24 DIAGNOSIS — D6959 Other secondary thrombocytopenia: Secondary | ICD-10-CM | POA: Diagnosis present

## 2020-10-24 DIAGNOSIS — R4182 Altered mental status, unspecified: Secondary | ICD-10-CM | POA: Diagnosis not present

## 2020-10-24 DIAGNOSIS — Z20822 Contact with and (suspected) exposure to covid-19: Secondary | ICD-10-CM | POA: Diagnosis present

## 2020-10-24 DIAGNOSIS — Z79899 Other long term (current) drug therapy: Secondary | ICD-10-CM | POA: Diagnosis not present

## 2020-10-24 DIAGNOSIS — R6521 Severe sepsis with septic shock: Secondary | ICD-10-CM | POA: Diagnosis present

## 2020-10-24 DIAGNOSIS — D649 Anemia, unspecified: Secondary | ICD-10-CM

## 2020-10-24 DIAGNOSIS — J90 Pleural effusion, not elsewhere classified: Secondary | ICD-10-CM | POA: Diagnosis not present

## 2020-10-24 DIAGNOSIS — J9621 Acute and chronic respiratory failure with hypoxia: Secondary | ICD-10-CM | POA: Diagnosis not present

## 2020-10-24 DIAGNOSIS — R531 Weakness: Secondary | ICD-10-CM | POA: Diagnosis not present

## 2020-10-24 DIAGNOSIS — R5381 Other malaise: Secondary | ICD-10-CM | POA: Diagnosis not present

## 2020-10-24 DIAGNOSIS — Z8542 Personal history of malignant neoplasm of other parts of uterus: Secondary | ICD-10-CM

## 2020-10-24 DIAGNOSIS — N184 Chronic kidney disease, stage 4 (severe): Secondary | ICD-10-CM | POA: Diagnosis not present

## 2020-10-24 DIAGNOSIS — M6281 Muscle weakness (generalized): Secondary | ICD-10-CM | POA: Diagnosis not present

## 2020-10-24 DIAGNOSIS — I5022 Chronic systolic (congestive) heart failure: Secondary | ICD-10-CM | POA: Diagnosis not present

## 2020-10-24 DIAGNOSIS — Z7901 Long term (current) use of anticoagulants: Secondary | ICD-10-CM | POA: Diagnosis not present

## 2020-10-24 DIAGNOSIS — R778 Other specified abnormalities of plasma proteins: Secondary | ICD-10-CM | POA: Diagnosis present

## 2020-10-24 DIAGNOSIS — I214 Non-ST elevation (NSTEMI) myocardial infarction: Secondary | ICD-10-CM | POA: Diagnosis present

## 2020-10-24 LAB — COMPREHENSIVE METABOLIC PANEL
ALT: 19 U/L (ref 0–44)
AST: 21 U/L (ref 15–41)
Albumin: 2.8 g/dL — ABNORMAL LOW (ref 3.5–5.0)
Alkaline Phosphatase: 81 U/L (ref 38–126)
Anion gap: 10 (ref 5–15)
BUN: 49 mg/dL — ABNORMAL HIGH (ref 8–23)
CO2: 28 mmol/L (ref 22–32)
Calcium: 8.2 mg/dL — ABNORMAL LOW (ref 8.9–10.3)
Chloride: 101 mmol/L (ref 98–111)
Creatinine, Ser: 2.12 mg/dL — ABNORMAL HIGH (ref 0.44–1.00)
GFR, Estimated: 25 mL/min — ABNORMAL LOW (ref >60)
Glucose, Bld: 160 mg/dL — ABNORMAL HIGH (ref 70–99)
Potassium: 4.1 mmol/L (ref 3.5–5.1)
Sodium: 139 mmol/L (ref 135–145)
Total Bilirubin: 1.5 mg/dL — ABNORMAL HIGH (ref 0.3–1.2)
Total Protein: 7.1 g/dL (ref 6.5–8.1)

## 2020-10-24 LAB — IRON AND TIBC
Iron: 11 ug/dL — ABNORMAL LOW (ref 28–170)
Saturation Ratios: 5 % — ABNORMAL LOW (ref 10.4–31.8)
TIBC: 209 ug/dL — ABNORMAL LOW (ref 250–450)
UIBC: 198 ug/dL

## 2020-10-24 LAB — CBC WITH DIFFERENTIAL/PLATELET
Abs Immature Granulocytes: 0.17 10*3/uL — ABNORMAL HIGH (ref 0.00–0.07)
Basophils Absolute: 0 10*3/uL (ref 0.0–0.1)
Basophils Relative: 0 %
Eosinophils Absolute: 0 10*3/uL (ref 0.0–0.5)
Eosinophils Relative: 0 %
HCT: 37.6 % (ref 36.0–46.0)
Hemoglobin: 11.9 g/dL — ABNORMAL LOW (ref 12.0–15.0)
Immature Granulocytes: 1 %
Lymphocytes Relative: 4 %
Lymphs Abs: 0.4 10*3/uL — ABNORMAL LOW (ref 0.7–4.0)
MCH: 30.2 pg (ref 26.0–34.0)
MCHC: 31.6 g/dL (ref 30.0–36.0)
MCV: 95.4 fL (ref 80.0–100.0)
Monocytes Absolute: 0.4 10*3/uL (ref 0.1–1.0)
Monocytes Relative: 4 %
Neutro Abs: 11.1 10*3/uL — ABNORMAL HIGH (ref 1.7–7.7)
Neutrophils Relative %: 91 %
Platelets: 98 10*3/uL — ABNORMAL LOW (ref 150–400)
RBC: 3.94 MIL/uL (ref 3.87–5.11)
RDW: 15.1 % (ref 11.5–15.5)
Smear Review: DECREASED
WBC: 12.1 10*3/uL — ABNORMAL HIGH (ref 4.0–10.5)
nRBC: 0 % (ref 0.0–0.2)

## 2020-10-24 LAB — URINALYSIS, COMPLETE (UACMP) WITH MICROSCOPIC
Bilirubin Urine: NEGATIVE
Glucose, UA: NEGATIVE mg/dL
Ketones, ur: NEGATIVE mg/dL
Nitrite: NEGATIVE
Protein, ur: 100 mg/dL — AB
Specific Gravity, Urine: 1.01 (ref 1.005–1.030)
WBC, UA: >50 WBC/hpf — ABNORMAL HIGH (ref 0–5)
pH: 5 (ref 5.0–8.0)

## 2020-10-24 LAB — RESP PANEL BY RT-PCR (FLU A&B, COVID) ARPGX2
Influenza A by PCR: NEGATIVE
Influenza B by PCR: NEGATIVE
SARS Coronavirus 2 by RT PCR: NEGATIVE

## 2020-10-24 LAB — BRAIN NATRIURETIC PEPTIDE: B Natriuretic Peptide: 534.9 pg/mL — ABNORMAL HIGH (ref 0.0–100.0)

## 2020-10-24 LAB — RETICULOCYTES
Immature Retic Fract: 11.5 % (ref 2.3–15.9)
RBC.: 3.82 MIL/uL — ABNORMAL LOW (ref 3.87–5.11)
Retic Count, Absolute: 58.4 10*3/uL (ref 19.0–186.0)
Retic Ct Pct: 1.5 % (ref 0.4–3.1)

## 2020-10-24 LAB — FERRITIN: Ferritin: 190 ng/mL (ref 11–307)

## 2020-10-24 LAB — TROPONIN I (HIGH SENSITIVITY)
Troponin I (High Sensitivity): 103 ng/L (ref <18)
Troponin I (High Sensitivity): 97 ng/L — ABNORMAL HIGH (ref <18)

## 2020-10-24 LAB — TECHNOLOGIST SMEAR REVIEW

## 2020-10-24 LAB — MAGNESIUM: Magnesium: 1.9 mg/dL (ref 1.7–2.4)

## 2020-10-24 LAB — LACTIC ACID, PLASMA: Lactic Acid, Venous: 1 mmol/L (ref 0.5–1.9)

## 2020-10-24 LAB — FOLATE: Folate: 19 ng/mL (ref >5.9)

## 2020-10-24 LAB — PROCALCITONIN: Procalcitonin: 61.37 ng/mL

## 2020-10-24 MED ORDER — ENOXAPARIN SODIUM 30 MG/0.3ML ~~LOC~~ SOLN: 30.0000 mg | SUBCUTANEOUS | Status: DC

## 2020-10-24 MED ORDER — POTASSIUM CHLORIDE CRYS ER 10 MEQ PO TBCR
10.0000 meq | EXTENDED_RELEASE_TABLET | Freq: Every day | ORAL | Status: DC
  Administered 2020-10-24 – 2020-10-31 (×8): 10 meq via ORAL
  Filled 2020-10-24 (×8): qty 1

## 2020-10-24 MED ORDER — SODIUM CHLORIDE 0.9 % IV SOLN
250.0000 mL | INTRAVENOUS | Status: DC | PRN
  Administered 2020-10-25 – 2020-10-28 (×3): 250 mL via INTRAVENOUS

## 2020-10-24 MED ORDER — CARVEDILOL 6.25 MG PO TABS
6.2500 mg | ORAL_TABLET | Freq: Two times a day (BID) | ORAL | Status: DC
  Administered 2020-10-25 – 2020-10-26 (×2): 6.25 mg via ORAL
  Filled 2020-10-24 (×2): qty 1

## 2020-10-24 MED ORDER — ACETAMINOPHEN 325 MG PO TABS
650.0000 mg | ORAL_TABLET | ORAL | Status: DC | PRN
  Administered 2020-10-24 – 2020-10-28 (×7): 650 mg via ORAL
  Filled 2020-10-24 (×7): qty 2

## 2020-10-24 MED ORDER — FUROSEMIDE 10 MG/ML IJ SOLN: 40.0000 mg | Freq: Two times a day (BID) | INTRAMUSCULAR | Status: DC

## 2020-10-24 MED ORDER — FUROSEMIDE 10 MG/ML IJ SOLN
80.0000 mg | Freq: Once | INTRAMUSCULAR | Status: AC
  Administered 2020-10-24: 80 mg via INTRAVENOUS
  Filled 2020-10-24: qty 8

## 2020-10-24 MED ORDER — ONDANSETRON HCL 4 MG/2ML IJ SOLN
4.0000 mg | Freq: Four times a day (QID) | INTRAMUSCULAR | Status: DC | PRN
  Administered 2020-10-27 (×2): 4 mg via INTRAVENOUS
  Filled 2020-10-24 (×2): qty 2

## 2020-10-24 MED ORDER — SODIUM CHLORIDE 0.9% FLUSH
3.0000 mL | Freq: Two times a day (BID) | INTRAVENOUS | Status: DC
  Administered 2020-10-24 – 2020-11-01 (×11): 3 mL via INTRAVENOUS

## 2020-10-24 MED ORDER — SODIUM CHLORIDE 0.9% FLUSH: 3.0000 mL | INTRAVENOUS | Status: DC | PRN

## 2020-10-24 NOTE — ED Notes (Signed)
Patient transported to CT 

## 2020-10-24 NOTE — ED Provider Notes (Signed)
[facility] Emergency Department Provider Note ____________________________________________   First MD Initiated Contact with Patient 10/24/20 1857     (approximate)  I have reviewed the triage vital signs and the nursing notes.  HISTORY  Chief Complaint Altered Mental Status and Fever   HPI Carmen [name] is a 64 y.o. femalewho presents to the ED for evaluation of confusion, fever and shortness of breath.  Chart review indicates recent admission with our hospitalists from 10/31-11/11 for a CHF exacerbation and associated hypoxia. Her diuretics were not changed and she was discharged on Lasix 40 twice daily. History of chronic diastolic heart failure, morbid obesity, chronic respiratory failure on 4 L home O2.  Patient presents to the ED from a local SNF due to increasing shortness of breath, generalized weakness and sensation of thirst. She cannot elaborate on the timeframe of her feeling poorly, she is limited due to her generalized confusion and nonparticipation of examination.   Past Medical History:  Diagnosis Date  . CHF (congestive heart failure) ([place])   . Endometrial cancer (HCC)    Grade 1  . Hypertension   . Lymphedema   . Morbid obesity ([place])   . Osteoarthritis of both knees     Patient Active Problem List   Diagnosis Date Noted  . Acute on chronic respiratory failure with hypoxia ([place]) 09/27/2020  . Morbid obesity with BMI of 60.0-69.9, adult ([place]) 09/27/2020  . Chronic diastolic CHF (congestive heart failure) ([place]) 09/27/2020  . Elevated troponin 09/27/2020  . Acute on chronic diastolic CHF (congestive heart failure) ([place]) 09/27/2020  . Acute CHF (congestive heart failure) ([place]) 09/27/2020  . Uterine carcinoma ([place]) 01/30/2012  . ABNORMAL GLANDULAR PAPANICOLAOU SMEAR OF CERVIX 10/10/2010  . SHORTNESS OF BREATH 09/30/2010  . OBESITY 09/02/2010  . KNEE PAIN, BILATERAL 09/02/2010    Past Surgical History:  Procedure Laterality  Date  . ROBOTIC ASSISTED LAP VAGINAL HYSTERECTOMY  01/10/2011   BSO    Prior to Admission medications   Medication Sig Start Date End Date Taking? Authorizing Provider  carvedilol (COREG) 6.25 MG tablet Take 1 tablet (6.25 mg total) by mouth 2 (two) times daily with a meal. Patient taking differently: Take 6.25 mg by mouth daily.  10/07/20   Pahwani, Michell Heinrich, MD  cloNIDine (CATAPRES) 0.1 MG tablet Take 1 tablet (0.1 mg total) by mouth daily. 10/18/20   Cletis Athens, MD  enalapril (VASOTEC) 20 MG tablet Take 20 mg by mouth daily. 07/21/20   [provider]  fexofenadine (ALLEGRA) 180 MG tablet Take 180 mg by mouth daily.    [provider]  furosemide (LASIX) 40 MG tablet Take 1 tablet (40 mg total) by mouth 2 (two) times daily. 10/07/20   Pahwani, Michell Heinrich, MD  Multiple Vitamins-Minerals (MEGA MULTI WOMEN PO) Take 1,200 mg by mouth daily.    [provider]  potassium chloride (KLOR-CON) 10 MEQ tablet TAKE 1 TABLET AT BEDTIME 07/05/20   Cletis Athens, MD  vitamin E 400 UNIT capsule Take 400 Units by mouth 2 (two) times daily.    [provider]    Allergies Clarithromycin and Penicillins  History reviewed. No pertinent family history.  Social History Social History   Tobacco Use  . Smoking status: Never Smoker  . Smokeless tobacco: Never Used  Substance Use Topics  . Alcohol use: No  . Drug use: No    Review of Systems  Unable to be accurately assessed due to patient's altered mentation. ____________________________________________   PHYSICAL  EXAM:  VITAL SIGNS: Vitals:   10/24/20 2015 10/24/20 2030  BP:  (!) 107/54  Pulse: 79 79  Resp: (!) 23 18  Temp:    SpO2: 90% 93%     Constitutional: Alert and oriented to person, time and location. Follows commands in all 4 extremities. Difficult to redirect as she frequently asks for water. Morbidly obese. Eyes: Conjunctivae are normal. PERRL. EOMI. Head: Atraumatic. Nose: No  congestion/rhinnorhea. Mouth/Throat: Mucous membranes are dry.  Oropharynx non-erythematous. Neck: No stridor. No cervical spine tenderness to palpation. Cardiovascular: Normal rate, regular rhythm. Grossly normal heart sounds.  Good peripheral circulation. Respiratory: Tachypneic to about 30. Bibasilar crackles are present. No expiratory wheezes. Gastrointestinal: Soft , nondistended, nontender to palpation. No CVA tenderness. Musculoskeletal: Pitting edema to bilateral lower extremities. Stigmata of venous stasis dermatitis bilaterally. Neurologic: Nonfocal exam without evidence of neurologic deficits.. Skin:  Skin is warm, dry . Stigmata of venous stasis dermatitis to bilateral lower extremities. Various skin tags to her lower extremities. Psychiatric: Mood and affect are appropriate  ____________________________________________   LABS (all labs ordered are listed, but only abnormal results are displayed)  Labs Reviewed  COMPREHENSIVE METABOLIC PANEL - Abnormal; Notable for the following components:      Result Value   Glucose, Bld 160 (*)    BUN 49 (*)    Creatinine, Ser 2.12 (*)    Calcium 8.2 (*)    Albumin 2.8 (*)    Total Bilirubin 1.5 (*)    GFR, Estimated 25 (*)    All other components within normal limits  CBC WITH DIFFERENTIAL/PLATELET - Abnormal; Notable for the following components:   WBC 12.1 (*)    Hemoglobin 11.9 (*)    Platelets 98 (*)    Neutro Abs 11.1 (*)    Lymphs Abs 0.4 (*)    Abs Immature Granulocytes 0.17 (*)    All other components within normal limits  BRAIN NATRIURETIC PEPTIDE - Abnormal; Notable for the following components:   B Natriuretic Peptide 534.9 (*)    All other components within normal limits  RESP PANEL BY RT-PCR (FLU A&B, COVID) ARPGX2  MAGNESIUM  LACTIC ACID, PLASMA  URINALYSIS, ROUTINE W REFLEX MICROSCOPIC  URINALYSIS, COMPLETE (UACMP) WITH MICROSCOPIC  TROPONIN I (HIGH SENSITIVITY)    ____________________________________________  12 Lead EKG  Rhythm, rate of 81 bpm. Normal axis and intervals. No evidence of acute ischemia. ____________________________________________  RADIOLOGY  ED MD interpretation: 1 view CXR reviewed by me with evidence of pulmonary vascular congestion and small bilateral pleural effusions without discrete lobar filtration to suggest bacterial pneumonia.  Official radiology report(s): CT Head Wo Contrast  Result Date: 10/24/2020 CLINICAL DATA:  Altered mental status. EXAM: CT HEAD WITHOUT CONTRAST TECHNIQUE: Contiguous axial images were obtained from the base of the skull through the vertex without intravenous contrast. COMPARISON:  None. FINDINGS: Brain: No evidence of acute infarction, hemorrhage, hydrocephalus, extra-axial collection or mass lesion/mass effect. Vascular: No hyperdense vessel or unexpected calcification. Skull: Normal. Negative for fracture or focal lesion. Sinuses/Orbits: No acute finding. Other: None. IMPRESSION: No acute intracranial pathology. Electronically Signed   By: Virgina Norfolk M.D.   On: 10/24/2020 21:01   DG Chest Portable 1 View  Result Date: 10/24/2020 CLINICAL DATA:  Shortness of breath EXAM: PORTABLE CHEST 1 VIEW COMPARISON:  Chest radiograph dated 10/05/2020 FINDINGS: The heart is enlarged. Vascular calcifications are seen in the aortic arch. Moderate patchy bilateral interstitial and airspace opacities appear increased since 10/05/2020. A small left pleural effusion may contribute. There  is no right pleural effusion. There is no pneumothorax. The osseous structures are intact. IMPRESSION: Increased moderate bilateral interstitial and airspace opacities may represent pulmonary edema and/or pneumonia. A small left pleural effusion may contribute. Electronically Signed   By: Zerita Boers M.D.   On: 10/24/2020 19:28    ____________________________________________   PROCEDURES and INTERVENTIONS  Procedure(s)  performed (including Critical Care):  .1-3 Lead EKG Interpretation Performed by: Vladimir Crofts, MD Authorized by: Vladimir Crofts, MD     Interpretation: normal     ECG rate:  80   ECG rate assessment: normal     Rhythm: sinus rhythm     Ectopy: none     Conduction: normal      Medications  furosemide (LASIX) injection 80 mg (80 mg Intravenous Given 10/24/20 2101)    ____________________________________________   MDM / ED COURSE   Morbidly obese patient with history of diastolic CHF presents to the ED short of breath with evidence of a CHF exacerbation and AKI, consistent with cardiorenal syndrome, requiring medical admission.  Patient requiring up to 6 L nasal cannula, increased from her home 4 L nasal cannula.  Otherwise hemodynamically stable.  Exam with morbidly obese patient with evidence of volume overload with dry mucous membranes.  No evidence of focal neurologic deficits or altered mentation to me.  She answers her orientation questions appropriately, follows commands and has no evidence of a focal deficit.  She is perseverating and requesting water as her most abnormal mental status finding.  CT head reviewed and without evidence of acute cranial pathology.  CXR with evidence of volume overload, and BNP quite elevated consistent with a CHF exacerbation.  Blood work also demonstrate evidence of an AKI, overall consistent with cardiorenal syndrome.  To initiate diuresis, patient was provided a milligrams of IV Lasix.  While there was some prehospital concerns for a fever, she has no evidence of such here.  She has a marginal leukocytosis, but no lactic acidosis or evidence of infectious etiology to necessitate antibiotics at this time.  We will admit her to hospitalist medicine for further work-up and management of her respiratory condition.   Clinical Course as of Oct 25 2107  Nancy Fetter Oct 24, 2020  2107 I spoke with Dr. Damita Dunnings, admitting hospitalist, who agrees to see the patient for  admission.   [DS]    Clinical Course User Index [DS] Vladimir Crofts, MD    ____________________________________________   FINAL CLINICAL IMPRESSION(S) / ED DIAGNOSES  Final diagnoses:  Cardiorenal syndrome with renal failure, stage 1-4 or unspecified chronic kidney disease, with heart failure (HCC)  Acute on chronic diastolic congestive heart failure (HCC)  Confusion  Hypoxia  Shortness of breath     ED Discharge Orders    None       Vonda Harth   Note:  This document was prepared using Dragon voice recognition software and may include unintentional dictation errors.   Vladimir Crofts, MD 10/24/20 2110

## 2020-10-24 NOTE — ED Triage Notes (Signed)
Pt arrives via EMS from [facility] after having increased confusion and increased SHOB- pt on a chromic 4L but was placed on 5L by EMS and is at 90%- pt temp was 100.3- pt states her throat hurts and that she has had decreased appetite

## 2020-10-24 NOTE — H&P (Signed)
History and Physical    Carmen [name] LKG:[id number] DOB: Jan 16, 1956 DOA: 10/24/2020  PCP: Carmen Athens, MD   Patient coming from: [place] have personally briefly reviewed patient's old medical records in [place]  Chief Complaint: Shortness of breath  HPI: Carmen [name] is a 64 y.o. female with medical history significant for chronic diastolic heart failure, morbid obesity, chronic lymphedema, chronic respiratory failure on home O2 at 2 to 4 L, recently hospitalized from 10/31-11/11 with respiratory failure from CHF exacerbation, at which time baseline O2 was increased from 2 L, to 2 to 4 L, who presents to the emergency room from [facility] with increased shortness of breath requiring 5 L by EMS to keep sats at 90, temp of 100.3 with complaint of sore throat and decreased appetite and with reported confusion.  States she had a low-grade fever earlier in the day and developed a dry cough.  She denies nausea vomiting or abdominal pain change in bowel habits or dysuria. ED Course: On arrival she had a low-grade temperature of 99.3 heart rate 81 she was tachypneic at 30 with O2 sat 92% on 6 L.  Blood work showed a white cell count of 12,000 with hemoglobin of 11.9, down from 13.1, and thrombocytopenia of 98K down from 187K.  Lactic acid 1.0.  BNP 534.  Troponin not done in the ER.  CMP reveals creatinine of 2.12,Up from baseline of 0.68.  Covid and flu test pending. EKG as reviewed by me : Normal sinus rhythm rate 81 with no acute ST-T wave changes Chest x-ray showed "Increased moderate bilateral interstitial and airspace opacities may represent pulmonary edema and/or pneumonia. A small left pleural effusion may contribute" Head CT showed no acute intracranial findings Patient treated with IV Lasix and hospitalist consulted for admission   Review of Systems: As per HPI otherwise all other systems on review of systems negative.    Past Medical History:  Diagnosis  Date  . CHF (congestive heart failure) ([place])   . Endometrial cancer (HCC)    Grade 1  . Hypertension   . Lymphedema   . Morbid obesity ([place])   . Osteoarthritis of both knees     Past Surgical History:  Procedure Laterality Date  . ROBOTIC ASSISTED LAP VAGINAL HYSTERECTOMY  01/10/2011   BSO     reports that she has never smoked. She has never used smokeless tobacco. She reports that she does not drink alcohol and does not use drugs.  Allergies  Allergen Reactions  . Clarithromycin     REACTION: respiratory issues, can't breathe  . Penicillins     REACTION: can't breath    History reviewed. No pertinent family history.    Prior to Admission medications   Medication Sig Start Date End Date Taking? Authorizing Provider  carvedilol (COREG) 6.25 MG tablet Take 1 tablet (6.25 mg total) by mouth 2 (two) times daily with a meal. Patient taking differently: Take 6.25 mg by mouth daily.  10/07/20   Pahwani, Michell Heinrich, MD  cloNIDine (CATAPRES) 0.1 MG tablet Take 1 tablet (0.1 mg total) by mouth daily. 10/18/20   Carmen Athens, MD  enalapril (VASOTEC) 20 MG tablet Take 20 mg by mouth daily. 07/21/20   [provider]  fexofenadine (ALLEGRA) 180 MG tablet Take 180 mg by mouth daily.    [provider]  furosemide (LASIX) 40 MG tablet Take 1 tablet (40 mg total) by mouth 2 (two) times daily. 10/07/20   Pahwani, Rinka  R, MD  Multiple Vitamins-Minerals (MEGA MULTI WOMEN PO) Take 1,200 mg by mouth daily.    [provider]  potassium chloride (KLOR-CON) 10 MEQ tablet TAKE 1 TABLET AT BEDTIME 07/05/20   Carmen Athens, MD  vitamin E 400 UNIT capsule Take 400 Units by mouth 2 (two) times daily.    [provider]    Physical Exam: Vitals:   10/24/20 1945 10/24/20 2000 10/24/20 2015 10/24/20 2030  BP:  109/64  (!) 107/54  Pulse: 77 80 79 79  Resp: (!) 31 (!) 21 (!) 23 18  Temp:      TempSrc:      SpO2: 92% 90% 90% 93%  Weight:      Height:          Vitals:   10/24/20 1945 10/24/20 2000 10/24/20 2015 10/24/20 2030  BP:  109/64  (!) 107/54  Pulse: 77 80 79 79  Resp: (!) 31 (!) 21 (!) 23 18  Temp:      TempSrc:      SpO2: 92% 90% 90% 93%  Weight:      Height:          Constitutional: Alert and oriented x 3 .  Patient awake with conversational dyspnea HEENT:      Head: Normocephalic and atraumatic.         Eyes: PERLA, EOMI, Conjunctivae are normal. Sclera is non-icteric.       Mouth/Throat: Mucous membranes are moist.       Neck: Supple with no signs of meningismus. Cardiovascular: Regular rate and rhythm. No murmurs, gallops, or rubs. 2+ symmetrical distal pulses are present . No JVD.  Bilateral hard nonpitting edema  respiratory: Respiratory effort increased.Lungs sounds diminished bilaterally. No wheezes, crackles, or rhonchi.  Gastrointestinal: Soft, non tender, and non distended with positive bowel sounds. No rebound or guarding. Genitourinary: No CVA tenderness. Musculoskeletal: Nontender with normal range of motion in all extremities.  Lichenified skin from chronic edema Neurologic:  Face is symmetric. Moving all extremities. No gross focal neurologic deficits . Skin: Skin is warm, dry.  No rash or ulcers Psychiatric: Mood and affect are normal    Labs on Admission: I have personally reviewed following labs and imaging studies  CBC: Recent Labs  Lab 10/24/20 1856  WBC 12.1*  NEUTROABS 11.1*  HGB 11.9*  HCT 37.6  MCV 95.4  PLT 98*   Basic Metabolic Panel: Recent Labs  Lab 10/24/20 1856 10/24/20 1918  NA 139  --   K 4.1  --   CL 101  --   CO2 28  --   GLUCOSE 160*  --   BUN 49*  --   CREATININE 2.12*  --   CALCIUM 8.2*  --   MG  --  1.9   GFR: Estimated Creatinine Clearance: 34.2 mL/min (A) (by C-G formula based on SCr of 2.12 mg/dL (H)). Liver Function Tests: Recent Labs  Lab 10/24/20 1856  AST 21  ALT 19  ALKPHOS 81  BILITOT 1.5*  PROT 7.1  ALBUMIN 2.8*   No results for  input(s): LIPASE, AMYLASE in the last 168 hours. No results for input(s): AMMONIA in the last 168 hours. Coagulation Profile: No results for input(s): INR, PROTIME in the last 168 hours. Cardiac Enzymes: No results for input(s): CKTOTAL, CKMB, CKMBINDEX, TROPONINI in the last 168 hours. BNP (last 3 results) No results for input(s): PROBNP in the last 8760 hours. HbA1C: No results for input(s): HGBA1C in the last 72 hours. CBG:  No results for input(s): GLUCAP in the last 168 hours. Lipid Profile: No results for input(s): CHOL, HDL, LDLCALC, TRIG, CHOLHDL, LDLDIRECT in the last 72 hours. Thyroid Function Tests: No results for input(s): TSH, T4TOTAL, FREET4, T3FREE, THYROIDAB in the last 72 hours. Anemia Panel: No results for input(s): VITAMINB12, FOLATE, FERRITIN, TIBC, IRON, RETICCTPCT in the last 72 hours. Urine analysis:    Component Value Date/Time   COLORURINE YELLOW 01/19/2011 1500   APPEARANCEUR CLEAR 01/19/2011 1500   LABSPEC 1.006 01/19/2011 1500   PHURINE 7.0 01/19/2011 1500   GLUCOSEU NEGATIVE 10/15/2010 2032   HGBUR LARGE (A) 01/19/2011 1500   BILIRUBINUR NEGATIVE 01/19/2011 1500   KETONESUR NEGATIVE 01/19/2011 1500   PROTEINUR NEGATIVE 01/19/2011 1500   UROBILINOGEN 0.2 01/19/2011 1500   NITRITE NEGATIVE 01/19/2011 1500   LEUKOCYTESUR NEGATIVE 01/19/2011 1500    Radiological Exams on Admission: CT Head Wo Contrast  Result Date: 10/24/2020 CLINICAL DATA:  Altered mental status. EXAM: CT HEAD WITHOUT CONTRAST TECHNIQUE: Contiguous axial images were obtained from the base of the skull through the vertex without intravenous contrast. COMPARISON:  None. FINDINGS: Brain: No evidence of acute infarction, hemorrhage, hydrocephalus, extra-axial collection or mass lesion/mass effect. Vascular: No hyperdense vessel or unexpected calcification. Skull: Normal. Negative for fracture or focal lesion. Sinuses/Orbits: No acute finding. Other: None. IMPRESSION: No acute intracranial  pathology. Electronically Signed   By: Virgina Norfolk M.D.   On: 10/24/2020 21:01   DG Chest Portable 1 View  Result Date: 10/24/2020 CLINICAL DATA:  Shortness of breath EXAM: PORTABLE CHEST 1 VIEW COMPARISON:  Chest radiograph dated 10/05/2020 FINDINGS: The heart is enlarged. Vascular calcifications are seen in the aortic arch. Moderate patchy bilateral interstitial and airspace opacities appear increased since 10/05/2020. A small left pleural effusion may contribute. There is no right pleural effusion. There is no pneumothorax. The osseous structures are intact. IMPRESSION: Increased moderate bilateral interstitial and airspace opacities may represent pulmonary edema and/or pneumonia. A small left pleural effusion may contribute. Electronically Signed   By: Zerita Boers M.D.   On: 10/24/2020 19:28     Assessment/Plan 64 year old female with history of chronic diastolic heart failure, morbid obesity, chronic lymphedema, chronic respiratory failure on home O2 at 2 to 4 L, recently hospitalized from 10/31-11/11 with respiratory failure from CHF exacerbation, at which time home O2 was increased from 2 L, to 2 to 4 L, presenting to the emergency room with increased shortness of breath requiring 6L/min as well as sore throat and low-grade fevers.    Acute on chronic diastolic CHF (congestive heart failure) (HCC)   Acute on chronic respiratory failure with hypoxia (HCC) -Patient tachypneic in the 30s with increased work of breathing requiring 6 L above home flow rate of 2-4 to maintain sats in the low 90s -Likely related to CHF exacerbation with BNP over 500, up from 73 three weeks prior -EF 55 to 60% on 11/1 -IV Lasix, continue Coreg.  Hold ACE inhibitor due to AKI -Daily weights -Addendum: Troponin 103, suspect demand ischemia.  We will continue to trend patient has no chest pain and EKG is nonacute    AKI (acute kidney injury) ([place]) -Creatinine 2.12 above baseline of 0.68 -Suspect related to  diuretic therapy and ACE inhibitor.  Patient was discharged recently on 11/11 on Lasix 40 mg p.o. twice daily and lisinopril 20 daily and was 19 L net negative at discharge -We will diurese gently and hold Lasix    Suspect pneumonia -Patient with low-grade temp on arrival of 99.3 ,  WBC 12,000, lactic acid normal at 1.0.  Covid and flu test negative -Chest x-ray "Increased moderate bilateral interstitial and airspace opacities may represent pulmonary edema and/or pneumonia. A small left pleural effusion may contribute   Urinalysis pending - Procalcitonin 61 so will initiate antibiotic therapy for pneumonia -Rocephin and azithromycin.  MRSA screen before considering vancomycin given recent hospitalization  Possible UTI -Urinalysis with pyuria -Rocephin as above and follow culture    Thrombocytopenia (HCC)   Anemia -Hemoglobin 11.9 down from 13.1 -Platelet count 98K down from 187K baseline -SCDs for DVT prophylaxis -Follow HIT, smear, anemia panel and stool for occult blood -Consider hematology consult    Morbid obesity with BMI of 50.0-59.9, adult (HCC) -Complicating factor to overall prognosis and care    Chronic acquired lymphedema -Keep legs elevated    DVT prophylaxis: SCDs Code Status: full code  Family Communication:  none  Disposition Plan: Back to previous home environment Consults called: none  Status:At the time of admission, it appears that the appropriate admission status for this patient is INPATIENT. This is judged to be reasonable and necessary in order to provide the required intensity of service to ensure the patient's safety given the presenting symptoms, physical exam findings, and initial radiographic and laboratory data in the context of their  Comorbid conditions.   Patient requires inpatient status due to high intensity of service, high risk for further deterioration and high frequency of surveillance required.   I certify that at the point of admission it is my  clinical judgment that the patient will require inpatient hospital care spanning beyond [facility] MD [facility]     10/24/2020, 9:45 PM

## 2020-10-24 NOTE — ED Notes (Signed)
Report given to Kassie, RN.

## 2020-10-25 DIAGNOSIS — J9621 Acute and chronic respiratory failure with hypoxia: Secondary | ICD-10-CM | POA: Diagnosis not present

## 2020-10-25 DIAGNOSIS — I5033 Acute on chronic diastolic (congestive) heart failure: Secondary | ICD-10-CM | POA: Diagnosis not present

## 2020-10-25 DIAGNOSIS — N179 Acute kidney failure, unspecified: Secondary | ICD-10-CM | POA: Diagnosis not present

## 2020-10-25 DIAGNOSIS — I4891 Unspecified atrial fibrillation: Secondary | ICD-10-CM

## 2020-10-25 LAB — BASIC METABOLIC PANEL
Anion gap: 8 (ref 5–15)
BUN: 53 mg/dL — ABNORMAL HIGH (ref 8–23)
CO2: 29 mmol/L (ref 22–32)
Calcium: 7.8 mg/dL — ABNORMAL LOW (ref 8.9–10.3)
Chloride: 101 mmol/L (ref 98–111)
Creatinine, Ser: 2.42 mg/dL — ABNORMAL HIGH (ref 0.44–1.00)
GFR, Estimated: 22 mL/min — ABNORMAL LOW (ref >60)
Glucose, Bld: 130 mg/dL — ABNORMAL HIGH (ref 70–99)
Potassium: 4.1 mmol/L (ref 3.5–5.1)
Sodium: 138 mmol/L (ref 135–145)

## 2020-10-25 LAB — MRSA PCR SCREENING: MRSA by PCR: NEGATIVE

## 2020-10-25 LAB — VITAMIN B12: Vitamin B-12: 1110 pg/mL — ABNORMAL HIGH (ref 180–914)

## 2020-10-25 LAB — LACTIC ACID, PLASMA
Lactic Acid, Venous: 1.2 mmol/L (ref 0.5–1.9)
Lactic Acid, Venous: 1.1 mmol/L (ref 0.5–1.9)

## 2020-10-25 LAB — HIV ANTIBODY (ROUTINE TESTING W REFLEX): HIV Screen 4th Generation wRfx: NONREACTIVE

## 2020-10-25 MED ORDER — SODIUM CHLORIDE 0.9 % IV SOLN
500.0000 mg | Freq: Every day | INTRAVENOUS | Status: DC
  Administered 2020-10-25 (×2): 500 mg via INTRAVENOUS
  Filled 2020-10-25 (×3): qty 500

## 2020-10-25 MED ORDER — SODIUM CHLORIDE 0.9 % IV SOLN: INTRAVENOUS | Status: DC

## 2020-10-25 MED ORDER — AMIODARONE HCL IN DEXTROSE 360-4.14 MG/200ML-% IV SOLN
60.0000 mg/h | INTRAVENOUS | Status: AC
  Administered 2020-10-26 (×2): 60 mg/h via INTRAVENOUS
  Filled 2020-10-25 (×2): qty 200

## 2020-10-25 MED ORDER — SODIUM CHLORIDE 0.9 % IV BOLUS (SEPSIS)
500.0000 mL | Freq: Once | INTRAVENOUS | Status: AC
  Administered 2020-10-25: 500 mL via INTRAVENOUS

## 2020-10-25 MED ORDER — SODIUM CHLORIDE 0.9 % IV BOLUS: 500.0000 mL | Freq: Once | INTRAVENOUS | Status: DC

## 2020-10-25 MED ORDER — METOPROLOL TARTRATE 5 MG/5ML IV SOLN
5.0000 mg | Freq: Once | INTRAVENOUS | Status: AC
  Administered 2020-10-25: 5 mg via INTRAVENOUS
  Filled 2020-10-25: qty 5

## 2020-10-25 MED ORDER — AMIODARONE HCL IN DEXTROSE 360-4.14 MG/200ML-% IV SOLN
30.0000 mg/h | INTRAVENOUS | Status: DC
  Administered 2020-10-26 – 2020-10-28 (×5): 30 mg/h via INTRAVENOUS
  Filled 2020-10-25 (×4): qty 200

## 2020-10-25 MED ORDER — SODIUM CHLORIDE 0.9 % IV BOLUS (SEPSIS)
1000.0000 mL | Freq: Once | INTRAVENOUS | Status: AC
  Administered 2020-10-25: 1000 mL via INTRAVENOUS

## 2020-10-25 MED ORDER — MORPHINE SULFATE (PF) 2 MG/ML IV SOLN
2.0000 mg | INTRAVENOUS | Status: DC | PRN
  Administered 2020-10-25 – 2020-10-27 (×3): 2 mg via INTRAVENOUS
  Filled 2020-10-25 (×3): qty 1

## 2020-10-25 MED ORDER — SODIUM CHLORIDE 0.9 % IV SOLN
2.0000 g | Freq: Every day | INTRAVENOUS | Status: AC
  Administered 2020-10-25 – 2020-10-28 (×5): 2 g via INTRAVENOUS
  Filled 2020-10-25 (×5): qty 20
  Filled 2020-10-25: qty 2
  Filled 2020-10-25: qty 20
  Filled 2020-10-25: qty 2

## 2020-10-25 MED ORDER — AMIODARONE LOAD VIA INFUSION
150.0000 mg | Freq: Once | INTRAVENOUS | Status: AC
  Administered 2020-10-26: 150 mg via INTRAVENOUS
  Filled 2020-10-25: qty 83.34

## 2020-10-25 NOTE — ED Notes (Signed)
Lab at bedside to draw lactic and blood cultures at this time

## 2020-10-25 NOTE — ED Notes (Signed)
Called floor at this time to inform that pt is on the way up

## 2020-10-25 NOTE — ED Notes (Signed)
Pt given warm blanket and sterile cup for sputum sample at this time

## 2020-10-25 NOTE — Progress Notes (Signed)
Elink following code sepsis °

## 2020-10-25 NOTE — TOC Initial Note (Addendum)
Transition of Care [facility]) - Initial/Assessment Note    Patient Details  Name: Carmen [name] MRN: [id number] Date of Birth: 1956/09/05  Transition of [facility]) CM/SW Contact:    Ova Freshwater Phone Number: [phone] 10/25/2020, 1:47 PM  Clinical Narrative:                  Patient presents to the ED due to SOB. CSW explained TOC role in patient care. Patient stated she uses a walker at home and is able to complete most ADLs without assistance.  The patient stated she sometimes has issues with incontinence because "I just don't make it on time."  Patient stated her Frey,Michael (Spouse) [phone] drives her to her doctor's appointment, her CP is Dr. Brown Human.  Patient stated she has her medication delivered through [facility] and she is able to take her medication without issues.  CSW was unable to continue assessment due to patient complaining her neck hurt.  CSW spoke with English as a second language teacher and let him know the patient stated her neck was uncomfortable and she was in pain.  Bill RN stated he would let the RN who is covering the patient's room about the patient's concerns.  Expected Discharge Plan: Skilled Nursing Facility Barriers to Discharge: Continued Medical Work up   Patient Goals and CMS Choice        Expected Discharge Plan and Services Expected Discharge Plan: [place] In-house Referral: Clinical Social Work     Living arrangements for the past 2 months: Single Family Home                                      Prior Living Arrangements/Services Living arrangements for the past 2 months: Single Family Home Lives with:: Spouse Patient language and need for interpreter reviewed:: Yes Do you feel safe going back to the place where you live?: Yes      Need for Family Participation in Patient Care: Yes (Comment) Care giver support system in place?: Yes (comment)   Criminal Activity/Legal Involvement Pertinent to Current Situation/Hospitalization:  No - Comment as needed  Activities of Daily Living Home Assistive Devices/Equipment: Walker (specify type) ADL Screening (condition at time of admission) Patient's cognitive ability adequate to safely complete daily activities?: Yes Is the patient deaf or have difficulty hearing?: No Does the patient have difficulty seeing, even when wearing glasses/contacts?: No Does the patient have difficulty concentrating, remembering, or making decisions?: No Patient able to express need for assistance with ADLs?: Yes Does the patient have difficulty dressing or bathing?: Yes Independently performs ADLs?: No Communication: Independent Dressing (OT): Needs assistance Is this a change from baseline?: Pre-admission baseline Grooming: Needs assistance Is this a change from baseline?: Pre-admission baseline Feeding: Independent Bathing: Needs assistance Is this a change from baseline?: Pre-admission baseline Toileting: Needs assistance Is this a change from baseline?: Pre-admission baseline In/Out Bed: Needs assistance Is this a change from baseline?: Pre-admission baseline Walks in Home: Needs assistance Is this a change from baseline?: Pre-admission baseline Does the patient have difficulty walking or climbing stairs?: Yes Weakness of Legs: Both Weakness of Arms/Hands: Both  Permission Sought/Granted Permission sought to share information with : Family Supports (Lavell,Michael (Spouse) [phone]) Permission granted to share information with : Yes, Verbal Permission Granted  Share Information with NAME: Ferrera,Michael (Spouse) [phone]           Emotional Assessment Appearance:: Appears older than stated  age Attitude/Demeanor/Rapport: Complaining, Engaged Affect (typically observed): Anxious Orientation: : Oriented to Self, Oriented to Place, Oriented to  Time, Oriented to Situation   Psych Involvement: No (comment)  Admission diagnosis:  Shortness of breath [R06.02] Confusion  [R41.0] Hypoxia [R09.02] Acute CHF (congestive heart failure) (HCC) [I50.9] Acute on chronic diastolic congestive heart failure (HCC) [I50.33] Cardiorenal syndrome with renal failure, stage 1-4 or unspecified chronic kidney disease, with heart failure ([place]) [I13.0] Patient Active Problem List   Diagnosis Date Noted  . AKI (acute kidney injury) ([place]) 10/24/2020  . Chronic acquired lymphedema 10/24/2020  . Leukocytosis 10/24/2020  . Thrombocytopenia ([place]) 10/24/2020  . Anemia 10/24/2020  . Acute on chronic respiratory failure with hypoxia ([place]) 09/27/2020  . Morbid obesity with BMI of 50.0-59.9, adult ([place]) 09/27/2020  . Chronic diastolic CHF (congestive heart failure) ([place]) 09/27/2020  . Elevated troponin 09/27/2020  . Acute on chronic diastolic CHF (congestive heart failure) ([place]) 09/27/2020  . Acute CHF (congestive heart failure) ([place]) 09/27/2020  . Uterine carcinoma ([place]) 01/30/2012  . ABNORMAL GLANDULAR PAPANICOLAOU SMEAR OF CERVIX 10/10/2010  . SHORTNESS OF BREATH 09/30/2010  . OBESITY 09/02/2010  . KNEE PAIN, BILATERAL 09/02/2010   PCP:  Cletis Athens, MD Pharmacy:   [place], [place] [address] Phone: [phone] Fax: [phone]     Social Determinants of Health (SDOH) Interventions    Readmission Risk Interventions No flowsheet data found.

## 2020-10-25 NOTE — ED Notes (Signed)
Brief changed, sheets changed, peri-care completed, new brief applied, used purewick replaced with new purewick.

## 2020-10-25 NOTE — ED Notes (Signed)
Pt stated no increased SOB or respiratory distress following 562mL bolus at this time.

## 2020-10-25 NOTE — ED Notes (Signed)
Clean chux and briefs applied at this time, after pt had small bowel movement. Pt given warm blanket at this time

## 2020-10-25 NOTE — ED Notes (Signed)
Lab called to draw lactic at this time. Per lab tech, someone will come draw.

## 2020-10-25 NOTE — Progress Notes (Signed)
CODE SEPSIS - PHARMACY COMMUNICATION  **Broad Spectrum Antibiotics should be administered within 1 hour of Sepsis diagnosis**  Time Code Sepsis Called/Page Received: 08:55  Antibiotics Ordered: Ceftriaxone and Azithromycin previously ordered  Time of 1st antibiotic administration: Ceftriaxone was given at 00:50 prior to code sepsis being called  Additional action taken by pharmacy: n/a  If necessary, Name of Provider/Nurse Contacted: n/a    Vira Blanco ,PharmD Clinical Pharmacist  10/25/2020  9:23 AM

## 2020-10-25 NOTE — Progress Notes (Signed)
Patient arrived to Unit from ED [facility] NT transporting.  Patient AOx4 upon arrival. NT Claiborne Billings assessed vitals and patient comfort needs. All needs met at this time.  Patient lower legs wrapped with gauze and ace bandages she reports new as of last night at [facility]. Patient did not want these removed. I assessed skin around and at edges. All intact at this time. No assessment made to skin under bandages. CN Linard Millers aware patient arrived to unit as well as assessment updates. Vitals WDL at this time and no additional needs from patient to report. Patient resting comfortably in bed watching TV at this time.

## 2020-10-25 NOTE — ED Notes (Signed)
Messaged admitting MD Damita Dunnings regarding pt BP at this time

## 2020-10-25 NOTE — ED Notes (Signed)
Pt cleaned up after large, loose bowel movement at this time. Clean chux and new,clean purewick applied at this time.

## 2020-10-25 NOTE — ED Notes (Signed)
Ariel NT transporting pt to floor at this time

## 2020-10-25 NOTE — ED Notes (Signed)
Spoke with admitting MD regarding pt BP at this time. Per admitting MD, hold next dose of lasix at this time.

## 2020-10-25 NOTE — ED Notes (Signed)
New assigned admitting paged by Holley Raring, ED secretary at this time.

## 2020-10-25 NOTE — Progress Notes (Signed)
PROGRESS NOTE    Carmen [name]  OZH:[id number] DOB: Sep 29, 1956 DOA: 10/24/2020 PCP: Cletis Athens, MD    Brief Narrative:  64 y.o. female with medical history significant for chronic diastolic heart failure, morbid obesity, chronic lymphedema, chronic respiratory failure on home O2 at 2 to 4 L, recently hospitalized from 10/31-11/11 with respiratory failure from CHF exacerbation, at which time baseline O2 was increased from 2 L, to 2 to 4 L, who presents to the emergency room from [facility] with increased shortness of breath requiring 5 L by EMS to keep sats at 90, temp of 100.3 with complaint of sore throat and decreased appetite and with reported confusion.  States she had a low-grade fever earlier in the day and developed a dry cough.  She denies nausea vomiting or abdominal pain change in bowel habits or dysuria.  In the ED, pt was found to be febrile with temp of 100.4 with WBC of 12k, RR 30, sbp down to 80's.   Assessment & Plan:   Principal Problem:   Acute on chronic diastolic CHF (congestive heart failure) (HCC) Active Problems:   Acute on chronic respiratory failure with hypoxia (HCC)   Morbid obesity with BMI of 50.0-59.9, adult (HCC)   Elevated troponin   AKI (acute kidney injury) ([place])   Chronic acquired lymphedema   Leukocytosis   Thrombocytopenia (HCC)   Anemia  Acute hypoxemic respiratory failure secondary to Pneumonia with severe septic shock present on admission -Presented with WBC of 12K with RR in the 30's, CXR findings of PNA, ARF, hypotension -Procalcitonin elevated at 61 -Pt is continued on Rocephin and Azithromycin -Pt did receive lasix overnight for initial concerns for CHF exacerbation. Lasix has been d/c'd and pt is continued on aggressive IVF hydration -Hypotension has resolved with IVF -Lactate is 1.2 -Will repeat CMP and CBC in AM  Chronic diastolic CHF (congestive heart failure), not in exacerbation -Elevated BNP likely secondary to septic  shock -Recent EF 55 to 60% on 11/1 -IV Lasix, continue Coreg with hold parameters.  Hold ACE inhibitor due to AKI -Mildly elevated trop is likely secondary to demand ischemia in the setting of septic shock -Cont IVF as tolerated    AKI (acute kidney injury) ([place]) -Creatinine 2.12 above baseline of 0.68 -Likely secondary to presenting severe sepsis -Hold nephrotoxic agents -Cont hydration as tolerated -Repeat CMP in AM  UTI present on admit -Urinalysis with pyuria -Continue with rocephin per above -Will order urine culture now    Thrombocytopenia (HCC)   Anemia -Hemoglobin 11.9 down from 13.1 -Platelet count 98K down from 187K baseline -SCDs for DVT prophylaxis -Thrombocytopenia is likely secondary to presenting severe sepsis -Repeat CBC in AM    Morbid obesity with BMI of 50.0-59.9, adult ([place]) -recommend diet/lifestyle modification    Chronic acquired lymphedema -Keep legs elevated  DVT prophylaxis: SCD's Code Status: Full Family Communication: Pt in room, family not at bedside  Status is: Inpatient  Remains inpatient appropriate because:Hemodynamically unstable, Unsafe d/c plan, IV treatments appropriate due to intensity of illness or inability to take PO and Inpatient level of care appropriate due to severity of illness   Dispo: The patient is from: SNF              Anticipated d/c is to: SNF              Anticipated d/c date is: > 3 days              Patient currently is not medically  stable to d/c.       Consultants:     Procedures:     Antimicrobials: Anti-infectives (From admission, onward)   Start     Dose/Rate Route Frequency Ordered Stop   10/25/20 0045  cefTRIAXone (ROCEPHIN) 2 g in sodium chloride 0.9 % 100 mL IVPB        2 g 200 mL/hr over 30 Minutes Intravenous Daily-1800 10/25/20 0036 10/29/20 1759   10/25/20 0045  azithromycin (ZITHROMAX) 500 mg in sodium chloride 0.9 % 250 mL IVPB        500 mg 250 mL/hr over 60 Minutes  Intravenous Daily-1800 10/25/20 0036 10/29/20 1759       Subjective: Complains of feeling thirsty this AM  Objective: Vitals:   10/25/20 1130 10/25/20 1200 10/25/20 1230 10/25/20 1316  BP: (!) 104/54 (!) 111/55 (!) 108/93 103/84  Pulse: 73 73 72 71  Resp: (!) 26 (!) 23 (!) 28 18  Temp:    98.7 F (37.1 C)  TempSrc:    Oral  SpO2: 92% 93% 92% 90%  Weight:      Height:        Intake/Output Summary (Last 24 hours) at 10/25/2020 1527 Last data filed at 10/25/2020 1251 Gross per 24 hour  Intake 300 ml  Output 400 ml  Net -100 ml   Filed Weights   10/24/20 1852 10/25/20 0804  Weight: 136.1 kg 136.1 kg    Examination:  General exam: Appears calm and comfortable  Respiratory system: Clear to auscultation. Increased resp effort Cardiovascular system: S1 & S2 heard, tachycardic Gastrointestinal system: Abdomen is nondistended, soft and nontender. No organomegaly or masses felt. Normal bowel sounds heard. Central nervous system: Alert and oriented. No focal neurological deficits. Extremities: Symmetric 5 x 5 power. Skin: No rashes, lesions, BLE chronic edematous changes Psychiatry: Judgement and insight appear normal. Mood & affect appropriate.   Data Reviewed: I have personally reviewed following labs and imaging studies  CBC: Recent Labs  Lab 10/24/20 1856  WBC 12.1*  NEUTROABS 11.1*  HGB 11.9*  HCT 37.6  MCV 95.4  PLT 98*   Basic Metabolic Panel: Recent Labs  Lab 10/24/20 1856 10/24/20 1918 10/25/20 0435  NA 139  --  138  K 4.1  --  4.1  CL 101  --  101  CO2 28  --  29  GLUCOSE 160*  --  130*  BUN 49*  --  53*  CREATININE 2.12*  --  2.42*  CALCIUM 8.2*  --  7.8*  MG  --  1.9  --    GFR: Estimated Creatinine Clearance: 30 mL/min (A) (by C-G formula based on SCr of 2.42 mg/dL (H)). Liver Function Tests: Recent Labs  Lab 10/24/20 1856  AST 21  ALT 19  ALKPHOS 81  BILITOT 1.5*  PROT 7.1  ALBUMIN 2.8*   No results for input(s): LIPASE, AMYLASE  in the last 168 hours. No results for input(s): AMMONIA in the last 168 hours. Coagulation Profile: No results for input(s): INR, PROTIME in the last 168 hours. Cardiac Enzymes: No results for input(s): CKTOTAL, CKMB, CKMBINDEX, TROPONINI in the last 168 hours. BNP (last 3 results) No results for input(s): PROBNP in the last 8760 hours. HbA1C: No results for input(s): HGBA1C in the last 72 hours. CBG: No results for input(s): GLUCAP in the last 168 hours. Lipid Profile: No results for input(s): CHOL, HDL, LDLCALC, TRIG, CHOLHDL, LDLDIRECT in the last 72 hours. Thyroid Function Tests: No results for input(s): TSH, T4TOTAL,  FREET4, T3FREE, THYROIDAB in the last 72 hours. Anemia Panel: Recent Labs    10/24/20 2240  VITAMINB12 1,110*  FOLATE 19.0  FERRITIN 190  TIBC 209*  IRON 11*  RETICCTPCT 1.5   Sepsis Labs: Recent Labs  Lab 10/24/20 1918 10/24/20 2240 10/25/20 0859  PROCALCITON  --  61.37  --   LATICACIDVEN 1.0  --  1.2    Recent Results (from the past 240 hour(s))  Resp Panel by RT-PCR (Flu A&B, Covid) Nasopharyngeal Swab     Status: None   Collection Time: 10/24/20  9:05 PM   Specimen: Nasopharyngeal Swab; Nasopharyngeal(NP) swabs in vial transport medium  Result Value Ref Range Status   SARS Coronavirus 2 by RT PCR NEGATIVE NEGATIVE Final    Comment: (NOTE) SARS-CoV-2 target nucleic acids are NOT DETECTED.  The SARS-CoV-2 RNA is generally detectable in upper respiratory specimens during the acute phase of infection. The lowest concentration of SARS-CoV-2 viral copies this assay can detect is 138 copies/mL. A negative result does not preclude SARS-Cov-2 infection and should not be used as the sole basis for treatment or other patient management decisions. A negative result may occur with  improper specimen collection/handling, submission of specimen other than nasopharyngeal swab, presence of viral mutation(s) within the areas targeted by this assay, and  inadequate number of viral copies(<138 copies/mL). A negative result must be combined with clinical observations, patient history, and epidemiological information. The expected result is Negative.  Fact Sheet for Patients:  EntrepreneurPulse.com.au  Fact Sheet for Healthcare Providers:  IncredibleEmployment.be  This test is no t yet approved or cleared by the [place] FDA and  has been authorized for detection and/or diagnosis of SARS-CoV-2 by FDA under an Emergency Use Authorization (EUA). This EUA will remain  in effect (meaning this test can be used) for the duration of the COVID-19 declaration under Section 564(b)(1) of the Act, 21 U.S.C.section 360bbb-3(b)(1), unless the authorization is terminated  or revoked sooner.       Influenza A by PCR NEGATIVE NEGATIVE Final   Influenza B by PCR NEGATIVE NEGATIVE Final    Comment: (NOTE) The Xpert Xpress SARS-CoV-2/FLU/RSV plus assay is intended as an aid in the diagnosis of influenza from Nasopharyngeal swab specimens and should not be used as a sole basis for treatment. Nasal washings and aspirates are unacceptable for Xpert Xpress SARS-CoV-2/FLU/RSV testing.  Fact Sheet for Patients: EntrepreneurPulse.com.au  Fact Sheet for Healthcare Providers: IncredibleEmployment.be  This test is not yet approved or cleared by the [place] FDA and has been authorized for detection and/or diagnosis of SARS-CoV-2 by FDA under an Emergency Use Authorization (EUA). This EUA will remain in effect (meaning this test can be used) for the duration of the COVID-19 declaration under Section 564(b)(1) of the Act, 21 U.S.C. section 360bbb-3(b)(1), unless the authorization is terminated or revoked.  Performed at [facility], [address] [place], [place] [address]   MRSA PCR Screening     Status: None   Collection Time: 10/25/20  1:26 PM   Specimen:  Nasopharyngeal  Result Value Ref Range Status   MRSA by PCR NEGATIVE NEGATIVE Final    Comment:        The GeneXpert MRSA Assay (FDA approved for NASAL specimens only), is one component of a comprehensive MRSA colonization surveillance program. It is not intended to diagnose MRSA infection nor to guide or monitor treatment for MRSA infections. Performed at [facility], [address], [place],  [address]  Radiology Studies: CT Head Wo Contrast  Result Date: 10/24/2020 CLINICAL DATA:  Altered mental status. EXAM: CT HEAD WITHOUT CONTRAST TECHNIQUE: Contiguous axial images were obtained from the base of the skull through the vertex without intravenous contrast. COMPARISON:  None. FINDINGS: Brain: No evidence of acute infarction, hemorrhage, hydrocephalus, extra-axial collection or mass lesion/mass effect. Vascular: No hyperdense vessel or unexpected calcification. Skull: Normal. Negative for fracture or focal lesion. Sinuses/Orbits: No acute finding. Other: None. IMPRESSION: No acute intracranial pathology. Electronically Signed   By: Virgina Norfolk M.D.   On: 10/24/2020 21:01   DG Chest Portable 1 View  Result Date: 10/24/2020 CLINICAL DATA:  Shortness of breath EXAM: PORTABLE CHEST 1 VIEW COMPARISON:  Chest radiograph dated 10/05/2020 FINDINGS: The heart is enlarged. Vascular calcifications are seen in the aortic arch. Moderate patchy bilateral interstitial and airspace opacities appear increased since 10/05/2020. A small left pleural effusion may contribute. There is no right pleural effusion. There is no pneumothorax. The osseous structures are intact. IMPRESSION: Increased moderate bilateral interstitial and airspace opacities may represent pulmonary edema and/or pneumonia. A small left pleural effusion may contribute. Electronically Signed   By: Zerita Boers M.D.   On: 10/24/2020 19:28    Scheduled Meds: . carvedilol  6.25 mg Oral BID WC  . potassium  chloride  10 mEq Oral QHS  . sodium chloride flush  3 mL Intravenous Q12H   Continuous Infusions: . sodium chloride    . azithromycin Stopped (10/25/20 0153)  . cefTRIAXone (ROCEPHIN)  IV Stopped (10/25/20 0120)     LOS: 1 day   Marylu Lund, MD [facility] Pager On Amion  If 7PM-7AM, please contact night-coverage 10/25/2020, 3:27 PM

## 2020-10-25 NOTE — ED Notes (Signed)
Attempted to call report at this time. RN unavailable at this time, will call back.

## 2020-10-25 NOTE — ED Notes (Signed)
Pt given breakfast tray at this time. 

## 2020-10-26 ENCOUNTER — Inpatient Hospital Stay: Payer: Medicare HMO

## 2020-10-26 DIAGNOSIS — I5033 Acute on chronic diastolic (congestive) heart failure: Secondary | ICD-10-CM | POA: Diagnosis not present

## 2020-10-26 DIAGNOSIS — N179 Acute kidney failure, unspecified: Secondary | ICD-10-CM | POA: Diagnosis not present

## 2020-10-26 DIAGNOSIS — J9621 Acute and chronic respiratory failure with hypoxia: Secondary | ICD-10-CM | POA: Diagnosis not present

## 2020-10-26 LAB — COMPREHENSIVE METABOLIC PANEL
ALT: 17 U/L (ref 0–44)
AST: 22 U/L (ref 15–41)
Albumin: 2.3 g/dL — ABNORMAL LOW (ref 3.5–5.0)
Alkaline Phosphatase: 102 U/L (ref 38–126)
Anion gap: 11 (ref 5–15)
BUN: 59 mg/dL — ABNORMAL HIGH (ref 8–23)
CO2: 27 mmol/L (ref 22–32)
Calcium: 8 mg/dL — ABNORMAL LOW (ref 8.9–10.3)
Chloride: 101 mmol/L (ref 98–111)
Creatinine, Ser: 2.19 mg/dL — ABNORMAL HIGH (ref 0.44–1.00)
GFR, Estimated: 24 mL/min — ABNORMAL LOW (ref >60)
Glucose, Bld: 128 mg/dL — ABNORMAL HIGH (ref 70–99)
Potassium: 4 mmol/L (ref 3.5–5.1)
Sodium: 139 mmol/L (ref 135–145)
Total Bilirubin: 1.5 mg/dL — ABNORMAL HIGH (ref 0.3–1.2)
Total Protein: 6.8 g/dL (ref 6.5–8.1)

## 2020-10-26 LAB — CBC
HCT: 35.1 % — ABNORMAL LOW (ref 36.0–46.0)
Hemoglobin: 11.2 g/dL — ABNORMAL LOW (ref 12.0–15.0)
MCH: 30.5 pg (ref 26.0–34.0)
MCHC: 31.9 g/dL (ref 30.0–36.0)
MCV: 95.6 fL (ref 80.0–100.0)
Platelets: 85 10*3/uL — ABNORMAL LOW (ref 150–400)
RBC: 3.67 MIL/uL — ABNORMAL LOW (ref 3.87–5.11)
RDW: 15.3 % (ref 11.5–15.5)
WBC: 7.2 10*3/uL (ref 4.0–10.5)
nRBC: 0 % (ref 0.0–0.2)

## 2020-10-26 LAB — T4, FREE: Free T4: 0.97 ng/dL (ref 0.61–1.12)

## 2020-10-26 LAB — HEPARIN INDUCED PLATELET AB (HIT ANTIBODY): Heparin Induced Plt Ab: 0.244 OD (ref 0.000–0.400)

## 2020-10-26 LAB — TSH: TSH: 3.123 u[IU]/mL (ref 0.350–4.500)

## 2020-10-26 MED ORDER — DILTIAZEM LOAD VIA INFUSION
15.0000 mg | Freq: Once | INTRAVENOUS | Status: DC
  Filled 2020-10-26: qty 15

## 2020-10-26 MED ORDER — AZITHROMYCIN 250 MG PO TABS: 250.0000 mg | ORAL_TABLET | Freq: Every day | ORAL | Status: DC

## 2020-10-26 MED ORDER — DILTIAZEM HCL-DEXTROSE 125-5 MG/125ML-% IV SOLN (PREMIX)
5.0000 mg/h | INTRAVENOUS | Status: DC
  Administered 2020-10-26: 5 mg/h via INTRAVENOUS
  Administered 2020-10-27: 15 mg/h via INTRAVENOUS
  Filled 2020-10-26 (×2): qty 125

## 2020-10-26 MED ORDER — DILTIAZEM HCL ER COATED BEADS 240 MG PO CP24
240.0000 mg | ORAL_CAPSULE | Freq: Every day | ORAL | Status: DC
  Administered 2020-10-26: 240 mg via ORAL
  Filled 2020-10-26: qty 1
  Filled 2020-10-26: qty 2

## 2020-10-26 MED ORDER — CHLORHEXIDINE GLUCONATE CLOTH 2 % EX PADS
6.0000 | MEDICATED_PAD | Freq: Every day | CUTANEOUS | Status: DC
  Administered 2020-10-26 – 2020-10-30 (×3): 6 via TOPICAL

## 2020-10-26 MED ORDER — DILTIAZEM LOAD VIA INFUSION
20.0000 mg | Freq: Once | INTRAVENOUS | Status: DC
  Filled 2020-10-26: qty 20

## 2020-10-26 MED ORDER — DOXYCYCLINE HYCLATE 100 MG PO TABS
100.0000 mg | ORAL_TABLET | Freq: Two times a day (BID) | ORAL | Status: AC
  Administered 2020-10-26 – 2020-10-31 (×10): 100 mg via ORAL
  Filled 2020-10-26 (×10): qty 1

## 2020-10-26 MED ORDER — AZITHROMYCIN 500 MG PO TABS
500.0000 mg | ORAL_TABLET | Freq: Every day | ORAL | Status: DC
  Filled 2020-10-26: qty 1

## 2020-10-26 NOTE — Consult Note (Signed)
[facility] Consultation Note  Patient ID: Carmen [name], MRN: [id number], DOB/AGE: 66-Jan-1957 66 y.o. Admit date: 10/24/2020   Date of Consult: 10/26/2020 Primary Physician: Carmen Athens, MD Primary Cardiologist: [facility]  Chief Complaint:  Chief Complaint  Patient presents with  . Altered Mental Status  . Fever   Reason for Consult: Atrial fibrillation  HPI: 64 y.o. female with known chronic diastolic dysfunction congestive heart failure hypertension and a severe lower extremity lymphedema for which the patient has been in a nursing facility.  The patient was recently hospitalized in October for acute on chronic diastolic dysfunction congestive heart failure and other infection.  At that time she received intravenous Lasix and had significant improvements of lymphedema and oxygenation.  She does have chronic oxygenation with supplemental O2 from apparent COPD.  Upon arrival the patient was seen in the emergency room due to severe shortness of breath and worsening hypoxia most consistent with pneumonia and respiratory failure with superimposed pulmonary edema from this hypoxia.  Additionally the patient has had issues with acute kidney injury for which medication management has been used.  She was placed on ceftriaxone and azithromycin for pyuria as well as pneumonia for which she has had slight improvements.  Intravenous Lasix has been given as well but has been held at this point due to continued acute kidney injury.  The patient last night had an bout of atrial fibrillation with rapid ventricular rate which is a new diagnosis.  This has not caused any significant new symptoms of chest discomfort congestive heart failure or acute coronary syndrome.  Previous echocardiogram has shown normal LV systolic function with ejection fraction of 60% and no evidence of significant valvular heart disease.  Currently she is continuing to have rapid ventricular rate needing further  control  Past Medical History:  Diagnosis Date  . CHF (congestive heart failure) ([place])   . Endometrial cancer (H[facility])    Grade 1  . Hypertension   . Lymphedema   . Morbid obesity ([place])   . Osteoarthritis of both knees       Surgical History:  Past Surgical History:  Procedure Laterality Date  . ROBOTIC ASSISTED LAP VAGINAL HYSTERECTOMY  01/10/2011   BSO     Home Meds: Prior to Admission medications   Medication Sig Start Date End Date Taking? Authorizing Provider  carvedilol (COREG) 6.25 MG tablet Take 1 tablet (6.25 mg total) by mouth 2 (two) times daily with a meal. Patient taking differently: Take 6.25 mg by mouth daily.  10/07/20  Yes Pahwani, Rinka R, MD  enalapril (VASOTEC) 20 MG tablet Take 20 mg by mouth daily. 07/21/20  Yes [provider]  fexofenadine (ALLEGRA) 180 MG tablet Take 180 mg by mouth daily.   Yes [provider]  furosemide (LASIX) 40 MG tablet Take 1 tablet (40 mg total) by mouth 2 (two) times daily. 10/07/20  Yes Pahwani, Rinka R, MD  Multiple Vitamins-Minerals (MEGA MULTI WOMEN PO) Take 1,200 mg by mouth daily.   Yes [provider]  potassium chloride (KLOR-CON) 10 MEQ tablet TAKE 1 TABLET AT BEDTIME 07/05/20  Yes Masoud, Viann Shove, MD  vitamin E 400 UNIT capsule Take 400 Units by mouth 2 (two) times daily.   Yes [provider]    Inpatient Medications:  . carvedilol  6.25 mg Oral BID WC  . potassium chloride  10 mEq Oral QHS  . sodium chloride flush  3 mL Intravenous Q12H   . sodium chloride 250 mL (10/25/20 1755)  .  sodium chloride 75 mL/hr at 10/25/20 2114  . amiodarone 30 mg/hr (10/26/20 0709)  . azithromycin 250 mL/hr at 10/25/20 1652  . cefTRIAXone (ROCEPHIN)  IV 2 g (10/25/20 1756)    Allergies:  Allergies  Allergen Reactions  . Clarithromycin     REACTION: respiratory issues, can't breathe  . Penicillins     REACTION: can't breath    Social History   Socioeconomic History  . Marital status: Married     Spouse name: Not on file  . Number of children: Not on file  . Years of education: Not on file  . Highest education level: Not on file  Occupational History  . Not on file  Tobacco Use  . Smoking status: Never Smoker  . Smokeless tobacco: Never Used  Substance and Sexual Activity  . Alcohol use: No  . Drug use: No  . Sexual activity: Never  Other Topics Concern  . Not on file  Social History Narrative  . Not on file   Social Determinants of Health   Financial Resource Strain:   . Difficulty of Paying Living Expenses: Not on file  Food Insecurity:   . Worried About Charity fundraiser in the Last Year: Not on file  . Ran Out of Food in the Last Year: Not on file  Transportation Needs:   . Lack of Transportation (Medical): Not on file  . Lack of Transportation (Non-Medical): Not on file  Physical Activity:   . Days of Exercise per Week: Not on file  . Minutes of Exercise per Session: Not on file  Stress:   . Feeling of Stress : Not on file  Social Connections:   . Frequency of Communication with Friends and Family: Not on file  . Frequency of Social Gatherings with Friends and Family: Not on file  . Attends Religious Services: Not on file  . Active Member of Clubs or Organizations: Not on file  . Attends Archivist Meetings: Not on file  . Marital Status: Not on file  Intimate Partner Violence:   . Fear of Current or Ex-Partner: Not on file  . Emotionally Abused: Not on file  . Physically Abused: Not on file  . Sexually Abused: Not on file     History reviewed. No pertinent family history.   Review of Systems Positive for shortness of breath cough congestion Negative for: General:  chills, fever, night sweats or weight changes.  Cardiovascular: PND orthopnea syncope dizziness  Dermatological skin lesions rashes Respiratory: Positive for cough congestion Urologic: Frequent urination urination at night and hematuria Abdominal: negative for nausea,  vomiting, diarrhea, bright red blood per rectum, melena, or hematemesis Neurologic: negative for visual changes, and/or hearing changes  All other systems reviewed and are otherwise negative except as noted above.  Labs: No results for input(s): CKTOTAL, CKMB, TROPONINI in the last 72 hours. Lab Results  Component Value Date   WBC 7.2 10/26/2020   HGB 11.2 (L) 10/26/2020   HCT 35.1 (L) 10/26/2020   MCV 95.6 10/26/2020   PLT 85 (L) 10/26/2020    Recent Labs  Lab 10/26/20 0329  NA 139  K 4.0  CL 101  CO2 27  BUN 59*  CREATININE 2.19*  CALCIUM 8.0*  PROT 6.8  BILITOT 1.5*  ALKPHOS 102  ALT 17  AST 22  GLUCOSE 128*   Lab Results  Component Value Date   CHOL 125 03/11/2014   HDL 26 (L) 03/11/2014   LDLCALC 84 03/11/2014   TRIG  73 03/11/2014   No results found for: DDIMER  Radiology/Studies:  CT Head Wo Contrast  Result Date: 10/24/2020 CLINICAL DATA:  Altered mental status. EXAM: CT HEAD WITHOUT CONTRAST TECHNIQUE: Contiguous axial images were obtained from the base of the skull through the vertex without intravenous contrast. COMPARISON:  None. FINDINGS: Brain: No evidence of acute infarction, hemorrhage, hydrocephalus, extra-axial collection or mass lesion/mass effect. Vascular: No hyperdense vessel or unexpected calcification. Skull: Normal. Negative for fracture or focal lesion. Sinuses/Orbits: No acute finding. Other: None. IMPRESSION: No acute intracranial pathology. Electronically Signed   By: Virgina Norfolk M.D.   On: 10/24/2020 21:01   CT Angio Chest PE W and/or Wo Contrast  Result Date: 09/27/2020 CLINICAL DATA:  Elevated D-dimer. EXAM: CT ANGIOGRAPHY CHEST WITH CONTRAST TECHNIQUE: Multidetector CT imaging of the chest was performed using the standard protocol during bolus administration of intravenous contrast. Multiplanar CT image reconstructions and MIPs were obtained to evaluate the vascular anatomy. CONTRAST:  14mL OMNIPAQUE IOHEXOL 350 MG/ML SOLN  COMPARISON:  None. FINDINGS: Cardiovascular: There is moderate severity calcification of the aortic arch. Satisfactory opacification of the pulmonary arteries to the segmental level. No evidence of pulmonary embolism. Normal heart size. No pericardial effusion. Mediastinum/Nodes: There is mild right hilar and AP window lymphadenopathy. Thyroid gland, trachea, and esophagus demonstrate no significant findings. Lungs/Pleura: A 6 mm noncalcified lung nodule versus focal scar seen within the posterior aspect of the left apex. An 8 mm noncalcified lung nodule is seen within the anterolateral aspect of the right upper lobe. A 6 mm posterior right upper lobe noncalcified lung nodule is seen. Adjacent 6 mm noncalcified lung nodules are seen along the posterolateral aspect of the right lower lobe. There is no evidence of acute infiltrate, pleural effusion or pneumothorax. Upper Abdomen: No acute abnormality. Musculoskeletal: No chest wall abnormality. No acute or significant osseous findings. Review of the MIP images confirms the above findings. IMPRESSION: 1. No evidence of pulmonary embolism. 2. Multiple bilateral noncalcified lung nodules. Correlation with follow-up chest CT is recommended in 3-6 months. 3. Aortic atherosclerosis. Aortic Atherosclerosis (ICD10-I70.0). Electronically Signed   By: Virgina Norfolk M.D.   On: 09/27/2020 01:28   DG Chest Portable 1 View  Result Date: 10/24/2020 CLINICAL DATA:  Shortness of breath EXAM: PORTABLE CHEST 1 VIEW COMPARISON:  Chest radiograph dated 10/05/2020 FINDINGS: The heart is enlarged. Vascular calcifications are seen in the aortic arch. Moderate patchy bilateral interstitial and airspace opacities appear increased since 10/05/2020. A small left pleural effusion may contribute. There is no right pleural effusion. There is no pneumothorax. The osseous structures are intact. IMPRESSION: Increased moderate bilateral interstitial and airspace opacities may represent  pulmonary edema and/or pneumonia. A small left pleural effusion may contribute. Electronically Signed   By: Zerita Boers M.D.   On: 10/24/2020 19:28   DG Chest Port 1 View  Result Date: 10/05/2020 CLINICAL DATA:  Shortness of breath. EXAM: PORTABLE CHEST 1 VIEW COMPARISON:  10/03/2020 FINDINGS: The heart is mildly enlarged but stable. Mild vascular congestion without overt pulmonary edema. No definite infiltrates or effusions. IMPRESSION: Cardiac enlargement and mild vascular congestion without overt pulmonary edema. Electronically Signed   By: Marijo Sanes M.D.   On: 10/05/2020 07:53   DG Chest Port 1 View  Result Date: 10/03/2020 CLINICAL DATA:  Dyspnea, CHF EXAM: PORTABLE CHEST 1 VIEW COMPARISON:  Chest radiograph from one day prior. FINDINGS: Stable cardiomediastinal silhouette with mild cardiomegaly. No pneumothorax. No pleural effusion. Borderline mild pulmonary edema, improved. No consolidative airspace  disease. IMPRESSION: Borderline mild congestive heart failure, improved. Electronically Signed   By: Ilona Sorrel M.D.   On: 10/03/2020 15:02   DG Chest Port 1 View  Result Date: 10/02/2020 CLINICAL DATA:  Shortness of breath, acute on chronic diastolic heart failure, morbid obesity, chronic respiratory failure, history of endometrial cancer EXAM: PORTABLE CHEST 1 VIEW COMPARISON:  Portable exam 0651 hours compared to 09/26/2020 FINDINGS: Enlargement of cardiac silhouette with pulmonary vascular congestion. Scattered interstitial infiltrate consistent with mild pulmonary edema and CHF. Atherosclerotic calcification and tortuosity of thoracic aorta. No pleural effusion or pneumothorax. Osseous structures unremarkable. IMPRESSION: Enlargement of cardiac silhouette with pulmonary vascular congestion and mild pulmonary edema. Electronically Signed   By: Lavonia Dana M.D.   On: 10/02/2020 12:32   DG Chest Portable 1 View  Result Date: 09/26/2020 CLINICAL DATA:  Shortness of breath. EXAM: PORTABLE  CHEST 1 VIEW COMPARISON:  March 12, 2014 FINDINGS: There is no evidence of acute infiltrate, pleural effusion or pneumothorax. Mild to moderate severity prominence of the perihilar pulmonary vasculature is seen. The cardiac silhouette is mildly enlarged. The visualized skeletal structures are unremarkable. IMPRESSION: Cardiomegaly with mild to moderate severity pulmonary vascular congestion. Electronically Signed   By: Virgina Norfolk M.D.   On: 09/26/2020 22:10   ECHOCARDIOGRAM COMPLETE  Result Date: 09/27/2020    ECHOCARDIOGRAM REPORT   Patient Name:   MADGE THERRIEN Date of Exam: 09/27/2020 Medical Rec #:  [id number]       Height:       62.0 in Accession #:    [id number]      Weight:       345.5 lb Date of Birth:  05/04/1956       BSA:          2.412 m Patient Age:    89 years        BP:           118/60 mmHg Patient Gender: F               HR:           64 bpm. Exam Location:  [facility] Procedure: 2D Echo, Cardiac Doppler and Color Doppler Indications:     CHF- acute diastolic 160.73  History:         Patient has no prior history of Echocardiogram examinations.                  CHF.  Sonographer:     Sherrie Sport RDCS (AE) Referring Phys:  [id number] Athena Masse Diagnosing Phys: Kathlyn Sacramento MD  Sonographer Comments: No apical window, no subcostal window and Technically challenging study due to limited acoustic windows. IMPRESSIONS  1. Left ventricular ejection fraction, by estimation, is 55 to 60%. The left ventricle has normal function. Left ventricular endocardial border not optimally defined to evaluate regional wall motion. Left ventricular diastolic function could not be evaluated.  2. Right ventricular systolic function is normal. The right ventricular size is normal.  3. Left atrial size was moderately dilated.  4. The mitral valve is normal in structure. No evidence of mitral valve regurgitation. No evidence of mitral stenosis.  5. The aortic valve is normal in structure. Aortic valve regurgitation is  not visualized. Mild to moderate aortic valve sclerosis/calcification is present, without any evidence of aortic stenosis.  6. Very challenging image quality with limited views. FINDINGS  Left Ventricle: Left ventricular ejection fraction, by estimation, is 55 to 60%. The left ventricle has normal function.  Left ventricular endocardial border not optimally defined to evaluate regional wall motion. The left ventricular internal cavity size was normal in size. There is no left ventricular hypertrophy. Left ventricular diastolic function could not be evaluated. Right Ventricle: The right ventricular size is normal. No increase in right ventricular wall thickness. Right ventricular systolic function is normal. Left Atrium: Left atrial size was moderately dilated. Right Atrium: Right atrial size was normal in size. Pericardium: There is no evidence of pericardial effusion. Mitral Valve: The mitral valve is normal in structure. No evidence of mitral valve regurgitation. No evidence of mitral valve stenosis. Tricuspid Valve: The tricuspid valve is normal in structure. Tricuspid valve regurgitation is trivial. No evidence of tricuspid stenosis. Aortic Valve: The aortic valve is normal in structure. Aortic valve regurgitation is not visualized. Mild to moderate aortic valve sclerosis/calcification is present, without any evidence of aortic stenosis. Pulmonic Valve: The pulmonic valve was normal in structure. Pulmonic valve regurgitation is not visualized. No evidence of pulmonic stenosis. Aorta: The aortic root is normal in size and structure. Venous: The inferior vena cava was not well visualized. IAS/Shunts: No atrial level shunt detected by color flow Doppler.  LEFT VENTRICLE PLAX 2D LVIDd:         4.22 cm LVIDs:         2.75 cm LV PW:         1.14 cm LV IVS:        0.99 cm LVOT diam:     2.00 cm LVOT Area:     3.14 cm  LEFT ATRIUM         Index LA diam:    4.50 cm 1.87 cm/m                        PULMONIC VALVE AORTA                  PV Vmax:        0.92 m/s Ao Root diam: 2.40 cm PV Peak grad:   3.4 mmHg                       RVOT Peak grad: 16 mmHg   SHUNTS Systemic Diam: 2.00 cm Kathlyn Sacramento MD Electronically signed by Kathlyn Sacramento MD Signature Date/Time: 09/27/2020/12:38:09 PM    Final     EKG: Normal sinus rhythm otherwise normal EKG Telemetry shows atrial fibrillation with rapid ventricular rate  Weights: Filed Weights   10/24/20 1852 10/25/20 0804 10/26/20 0425  Weight: 136.1 kg 136.1 kg (!) 137.8 kg     Physical Exam: Blood pressure 107/83, pulse (!) 130, temperature 98.1 F (36.7 C), temperature source Oral, resp. rate 17, height 5\' 1"  (1.549 m), weight (!) 137.8 kg, SpO2 92 %. Body mass index is 57.4 kg/m. General: Well developed, well nourished, in no acute distress. Head eyes ears nose throat: Normocephalic, atraumatic, sclera non-icteric, no xanthomas, nares are without discharge. No apparent thyromegaly and/or mass  Lungs: Normal respiratory effort.  no wheezes, no rales, diffuse rhonchi.  Heart: Irregular with normal S1 S2. no murmur gallop, no rub, PMI is normal size and placement, carotid upstroke normal without bruit, jugular venous pressure is normal Abdomen: Soft, non-tender, non-distended with normoactive bowel sounds. No hepatomegaly. No rebound/guarding. No obvious abdominal masses. Abdominal aorta is normal size without bruit Extremities: Severe lymphedema and 2+ edema. no cyanosis, no clubbing, no ulcers  Peripheral : 2+ bilateral upper extremity pulses, 0+ bilateral femoral  pulses, 0+ bilateral dorsal pedal pulse Neuro: Alert and oriented. No facial asymmetry. No focal deficit. Moves all extremities spontaneously. Musculoskeletal: Normal muscle tone without kyphosis Psych:  Responds to questions appropriately with a normal affect.    Assessment: 64 year old female with severe chronic lymphedema chronic diastolic dysfunction congestive heart failure now worsened by pyuria,  sepsis, and acute respiratory failure due to pneumonia with acute kidney injury now with atrial fibrillation with rapid ventricular rate without evidence of acute coronary syndrome or myocardial infarction  Plan: 1.  Continue intravenous amiodarone for 1 more day for amiodarone load for possible conversion of atrial fibrillation to normal sinus rhythm will consider the possibility of changing to oral amiodarone thereafter 2.  Begin anticoagulation for further risk reduction in stroke with atrial fibrillation using intravenous heparin 3.  Addition of long-acting CD diltiazem for better heart rate control in addition to the amiodarone and discontinuation of carvedilol due to lower blood pressures and needing heart rate control with carvedilol as less heart rate control properties 4.  No further cardiac diagnostics necessary at this time 5.  Continue supportive care and treatment of sepsis respiratory failure and pneumonia 6.  No additional intravenous Lasix at this time until patient has improvements of acute kidney injury and will treat and lower extremity edema and lymphedema with compression  Signed, Corey Skains M.D. [facility] 10/26/2020, 8:22 AM

## 2020-10-26 NOTE — Progress Notes (Signed)
   10/25/20 2034  Assess: MEWS Score  Temp 98.6 F (37 C)  BP 124/72  Pulse Rate (!) 152  Level of Consciousness Alert  Assess: MEWS Score  MEWS Temp 0  MEWS Systolic 0  MEWS Pulse 3  MEWS RR 0  MEWS LOC 0  MEWS Score 3  MEWS Score Color Yellow  Assess: if the MEWS score is Yellow or Red  Were vital signs taken at a resting state? Yes  Focused Assessment Change from prior assessment (see assessment flowsheet) (New Atrial Fibrillation w/rvr)  Early Detection of Sepsis Score *See Row Information* Medium  MEWS guidelines implemented *See Row Information* No, other (Comment) (Continue to monitor)  Escalate  MEWS: Escalate Yellow: discuss with charge nurse/RN and consider discussing with provider and RRT  Notify: Charge Nurse/RN  Name of Charge Nurse/RN Notified Alisa  Date Charge Nurse/RN Notified 10/25/20  Time Charge Nurse/RN Notified 2030  Notify: Provider  Provider Name/Title Ouma  Date Provider Notified 10/25/20  Time Provider Notified 2100  Notification Type Page  Notification Reason Change in status (elevated heartrate)  Response See new orders  Date of Provider Response 10/25/20  Time of Provider Response 2105  Document  Patient Outcome Not stable and remains on department;Other (Comment)  Progress note created (see row info) Yes

## 2020-10-26 NOTE — Progress Notes (Signed)
PHARMACIST - PHYSICIAN COMMUNICATION  CONCERNING: Antibiotic IV to Oral Route Change Policy  RECOMMENDATION: This patient is receiving azithromycin by the intravenous route.  Based on criteria approved by the Pharmacy and Therapeutics Committee, the antibiotic(s) is/are being converted to the equivalent oral dose form(s).   DESCRIPTION: These criteria include:  Patient being treated for a respiratory tract infection, urinary tract infection, cellulitis or clostridium difficile associated diarrhea if on metronidazole  The patient is not neutropenic and does not exhibit a GI malabsorption state  The patient is eating (either orally or via tube) and/or has been taking other orally administered medications for a least 24 hours  The patient is improving clinically and has a Tmax < 100.5  If you have questions about this conversion, please contact the [place]  10/26/20

## 2020-10-26 NOTE — Progress Notes (Signed)
   10/26/20 1100  Assess: MEWS Score  ECG Heart Rate (!) 140  Resp (!) 22  Level of Consciousness Alert  Assess: MEWS Score  MEWS Temp 0  MEWS Systolic 0  MEWS Pulse 3  MEWS RR 1  MEWS LOC 0  MEWS Score 4  MEWS Score Color Red  Assess: if the MEWS score is Yellow or Red  Were vital signs taken at a resting state? Yes  Focused Assessment Change from prior assessment (see assessment flowsheet)  Early Detection of Sepsis Score *See Row Information* High  MEWS guidelines implemented *See Row Information* Yes  Treat  MEWS Interventions Escalated (See documentation below)  Pain Scale 0-10  Pain Score 8  Pain Type Chronic pain  Pain Location Back  Take Vital Signs  Increase Vital Sign Frequency  Red: Q 1hr X 4 then Q 4hr X 4, if remains red, continue Q 4hrs  Escalate  MEWS: Escalate Red: discuss with charge nurse/RN and provider, consider discussing with RRT  Notify: Charge Nurse/RN  Name of Charge Nurse/RN Notified Tammy Todd  Date Charge Nurse/RN Notified 10/26/20  Time Charge Nurse/RN Notified 1130  Notify: Provider  Provider Name/Title Martin Majestic  Date Provider Notified 10/26/20  Time Provider Notified 1130  Notification Type Call (and Secure chat)  Notification Reason Change in status  Response See new orders;Other (Comment) (Transfer to higher level of care)  Date of Provider Response 10/26/20  Time of Provider Response 1135  Document  Patient Outcome Transferred/level of care increased  Progress note created (see row info) Yes

## 2020-10-26 NOTE — Progress Notes (Signed)
   10/25/20 2346  Assess: MEWS Score  Temp 98.6 F (37 C)  BP (!) 112/53 (rechecked right size cuff)  Pulse Rate 71  ECG Heart Rate (!) 149  Resp 20  SpO2 95 %  O2 Device Nasal Cannula  Assess: MEWS Score  MEWS Temp 0  MEWS Systolic 0  MEWS Pulse 3  MEWS RR 0  MEWS LOC 0  MEWS Score 3  MEWS Score Color Yellow  Assess: if the MEWS score is Yellow or Red  Were vital signs taken at a resting state? Yes  Focused Assessment No change from prior assessment  Early Detection of Sepsis Score *See Row Information* Medium  MEWS guidelines implemented *See Row Information* No, previously yellow, continue vital signs every 4 hours  Escalate  MEWS: Escalate Yellow: discuss with charge nurse/RN and consider discussing with provider and RRT  Notify: Charge Nurse/RN  Name of Charge Nurse/RN Notified Alisa  Date Charge Nurse/RN Notified 10/25/20  Time Charge Nurse/RN Notified 2350  Notify: Provider  Provider Name/Title Ouma,Np  Date Provider Notified 10/25/20  Time Provider Notified 2350  Notification Type  (Secure chat)  Notification Reason Other (Comment) (Elevated heartrate)  Response See new orders  Date of Provider Response 10/25/20  Time of Provider Response 2350  Document  Patient Outcome Other (Comment) (Continue to monitor)  Progress note created (see row info) Yes

## 2020-10-26 NOTE — Progress Notes (Signed)
Pt with increase in heartrate up into the 160s afib w/rvr. Notified Np. Orders placed. Continued to monitor pt throughout the evening.

## 2020-10-26 NOTE — Progress Notes (Signed)
Patient has MEWS of YELLOW.  Dr. Nehemiah Massed is adjusting medications including amio gtt, PO cardizem.  Will continue to monitor with standard vitals.

## 2020-10-26 NOTE — Progress Notes (Signed)
Attempted to call husband again without success.

## 2020-10-26 NOTE — Progress Notes (Signed)
°   10/26/20 1100  Assess: MEWS Score  ECG Heart Rate (!) 140  Resp (!) 22  Level of Consciousness Alert  Assess: MEWS Score  MEWS Temp 0  MEWS Systolic 0  MEWS Pulse 3  MEWS RR 1  MEWS LOC 0  MEWS Score 4  MEWS Score Color Red  Assess: if the MEWS score is Yellow or Red  Were vital signs taken at a resting state? Yes  Focused Assessment Change from prior assessment (see assessment flowsheet)  Early Detection of Sepsis Score *See Row Information* High  MEWS guidelines implemented *See Row Information* Yes  Treat  MEWS Interventions Escalated (See documentation below)  Pain Scale 0-10  Pain Score 8  Pain Type Chronic pain  Pain Location Back  Take Vital Signs  Increase Vital Sign Frequency  Red: Q 1hr X 4 then Q 4hr X 4, if remains red, continue Q 4hrs  Escalate  MEWS: Escalate Red: discuss with charge nurse/RN and provider, consider discussing with RRT  Document  Patient Outcome Transferred/level of care increased  Progress note created (see row info) Yes

## 2020-10-26 NOTE — Progress Notes (Addendum)
° ° °  BRIEF OVERNIGHT PROGRESS REPORT   SUBJECTIVE: Patient went into new onset afib with RVR. Per RN, HR between 130 -168bmp. She was also noted to be slightly diaphoretic and hypotensive.  OBJECTIVE: On arrival to the bedside, she was afebrile with blood pressure 88/58 mm Hg and pulse rate 149-168 beats/min. There were no focal neurological deficits; she was alert and oriented x4,and did not c/o palpitation. No evidence of diaphoresis. Patient stated she was feeling "fine".  ASSESSMENT & PLAN: 64 y.o female with PMH of chronic diastolic CHF, Morbid obesity, chronic lymphedema, chronic respiratory failure on home 02 at 2L admitted with acute on chronic hypoxic respiratory failure secondary to pneumonia now complicated by new onset Afib with RVR  New onset Afib+RVR  Hx:CHF -EKG reviewed and shows afib with rvr rate 156bmp -Trend Troponins -Check TSH, FT4 -CXR shows Increased moderate bilateral interstitial and airspace opacities  -Will obtain  TTEcho -Metoprolol 5mg  IV  X 1 administered with no effect -Due to borderline hypotension will start Amiodarone 150mg  IV bolus, then 1mg /min x6 hrs, then 0.5mg /min for 18 hours -Keep NPO for option of TEEcho + DCCardioversion if no improvement.  # Thromboembolism Risk Management CHADS2 = 2 -Cardiology Consultation. Discussed with patient's cardiologist Dr. Nehemiah Massed who recommend adding Diltiazem if rate persistent >110      Rufina Falco, DNP, CCRN, [facility] Nurse Practitioner Between 7pm to [employer] - Pager [phone]

## 2020-10-26 NOTE — Progress Notes (Signed)
PROGRESS NOTE    Carmen [name]  FBP:[id number] DOB: August 03, 1956 DOA: 10/24/2020 PCP: Cletis Athens, MD    Brief Narrative:  64 y.o. female with medical history significant for chronic diastolic heart failure, morbid obesity, chronic lymphedema, chronic respiratory failure on home O2 at 2 to 4 L, recently hospitalized from 10/31-11/11 with respiratory failure from CHF exacerbation, at which time baseline O2 was increased from 2 L, to 2 to 4 L, who presents to the emergency room from [facility] with increased shortness of breath requiring 5 L by EMS to keep sats at 90, temp of 100.3 with complaint of sore throat and decreased appetite and with reported confusion.  States she had a low-grade fever earlier in the day and developed a dry cough.  She denies nausea vomiting or abdominal pain change in bowel habits or dysuria.  In the ED, pt was found to be febrile with temp of 100.4 with WBC of 12k, RR 30, sbp down to 80's.   Assessment & Plan:   Principal Problem:   Acute on chronic diastolic CHF (congestive heart failure) (HCC) Active Problems:   Acute on chronic respiratory failure with hypoxia (HCC)   Morbid obesity with BMI of 50.0-59.9, adult (HCC)   Elevated troponin   AKI (acute kidney injury) ([place])   Chronic acquired lymphedema   Leukocytosis   Thrombocytopenia (HCC)   Anemia  Acute hypoxemic respiratory failure secondary to Pneumonia with severe septic shock present on admission -Presented with WBC of 12K with RR in the 30's, ARF, hypotension, and CXR findings of PNA -Procalcitonin was elevated at 61 -Pt was continued on Rocephin and Azithromycin. Given concurrent afib with amiodarone, will change azithro to doxy -Pt initially did receive lasix given concerns for possible CHF exacerbation. CHF has been ruled out and lasix has been d/c'd -Hypotension has resolved with IVF -Lactate is 1.2 -Pt has tolerated IVF hydration. Shock physiology has resolved -Repeat CBC and cmp in  AM  Chronic diastolic CHF (congestive heart failure), not in exacerbation -Elevated BNP likely secondary to septic shock -Recent EF 55 to 60% on 11/1 -IV Lasix, continue Coreg with hold parameters.  Holding ACE inhibitor due to AKI -Mildly elevated trop is likely secondary to demand ischemia in the setting of septic shock -Cont IVF as tolerated    AKI (acute kidney injury) ([place]) -Presenting creatinine was 2.12 (baseline of 0.68) -Likely secondary to presenting severe sepsis -Hold nephrotoxic agents -Cont hydration as tolerated -Recheck CMP in AM  UTI present on admit -Urinalysis with pyuria -Continue with rocephin per above -Urine culture is pending    Thrombocytopenia (HCC)   Anemia -Presenting hemoglobin 11.9 down from 13 with platelet count 98K down from 187K baseline -Thrombocytopenia is likely secondary to presenting severe sepsis -Plts are down to 85 today -Cont abx per above -Repeat CBC in AM    Morbid obesity with BMI of 50.0-59.9, adult ([place]) -recommend diet/lifestyle modification    Chronic acquired lymphedema -Keep legs elevated and wrapped  Afib with RVR, new finding -See overnight events. Patient went into rapid afib overnight -Cardiology was consulted and is following -Pt is continued on amiodarone gtt. Discussed with Cardiology who had recommended adding cardizem gtt for added rate control -Pt has since been transferred to SDU  Code Status -Code status addressed with patient with husband present. Pt wishes are noted to be DNR/DNI, orders placed  DVT prophylaxis: SCD's Code Status: Full Family Communication: Pt in room, Husband is at bedside  Status is: Inpatient  Remains  inpatient appropriate because:Hemodynamically unstable, Unsafe d/c plan, IV treatments appropriate due to intensity of illness or inability to take PO and Inpatient level of care appropriate due to severity of illness  Dispo: The patient is from: SNF              Anticipated  d/c is to: SNF              Anticipated d/c date is: > 3 days              Patient currently is not medically stable to d/c.   Consultants:   Cardiology  Procedures:     Antimicrobials: Anti-infectives (From admission, onward)   Start     Dose/Rate Route Frequency Ordered Stop   10/25/20 0045  cefTRIAXone (ROCEPHIN) 2 g in sodium chloride 0.9 % 100 mL IVPB        2 g 200 mL/hr over 30 Minutes Intravenous Daily-1800 10/25/20 0036 10/29/20 1759   10/25/20 0045  azithromycin (ZITHROMAX) 500 mg in sodium chloride 0.9 % 250 mL IVPB        500 mg 250 mL/hr over 60 Minutes Intravenous Daily-1800 10/25/20 0036 10/29/20 1759      Subjective: Complains of pain in legs  Objective: Vitals:   10/26/20 1202 10/26/20 1417 10/26/20 1430 10/26/20 1500  BP: (!) 127/97 106/63    Pulse:  89 86 70  Resp: 20 (!) 26 (!) 22 (!) 25  Temp:  98.5 F (36.9 C)    TempSrc:      SpO2:  94%    Weight:      Height:        Intake/Output Summary (Last 24 hours) at 10/26/2020 1550 Last data filed at 10/26/2020 1004 Gross per 24 hour  Intake 853.24 ml  Output 950 ml  Net -96.76 ml   Filed Weights   10/24/20 1852 10/25/20 0804 10/26/20 0425  Weight: 136.1 kg 136.1 kg (!) 137.8 kg    Examination: General exam: Awake, laying in bed, in nad Respiratory system: Normal respiratory effort, decreased BS  Cardiovascular system: tachycardic, irregularly irregular Gastrointestinal system: Soft, nondistended, positive BS Central nervous system: CN2-12 grossly intact, strength intact Extremities: Perfused, BLE chronic edema Skin: Normal skin turgor, chronic skin changes in LE Psychiatry: Mood normal // no visual hallucinations   Data Reviewed: I have personally reviewed following labs and imaging studies  CBC: Recent Labs  Lab 10/24/20 1856 10/26/20 0329  WBC 12.1* 7.2  NEUTROABS 11.1*  --   HGB 11.9* 11.2*  HCT 37.6 35.1*  MCV 95.4 95.6  PLT 98* 85*   Basic Metabolic Panel: Recent Labs   Lab 10/24/20 1856 10/24/20 1918 10/25/20 0435 10/26/20 0329  NA 139  --  138 139  K 4.1  --  4.1 4.0  CL 101  --  101 101  CO2 28  --  29 27  GLUCOSE 160*  --  130* 128*  BUN 49*  --  53* 59*  CREATININE 2.12*  --  2.42* 2.19*  CALCIUM 8.2*  --  7.8* 8.0*  MG  --  1.9  --   --    GFR: Estimated Creatinine Clearance: 33.4 mL/min (A) (by C-G formula based on SCr of 2.19 mg/dL (H)). Liver Function Tests: Recent Labs  Lab 10/24/20 1856 10/26/20 0329  AST 21 22  ALT 19 17  ALKPHOS 81 102  BILITOT 1.5* 1.5*  PROT 7.1 6.8  ALBUMIN 2.8* 2.3*   No results for input(s): LIPASE, AMYLASE in  the last 168 hours. No results for input(s): AMMONIA in the last 168 hours. Coagulation Profile: No results for input(s): INR, PROTIME in the last 168 hours. Cardiac Enzymes: No results for input(s): CKTOTAL, CKMB, CKMBINDEX, TROPONINI in the last 168 hours. BNP (last 3 results) No results for input(s): PROBNP in the last 8760 hours. HbA1C: No results for input(s): HGBA1C in the last 72 hours. CBG: No results for input(s): GLUCAP in the last 168 hours. Lipid Profile: No results for input(s): CHOL, HDL, LDLCALC, TRIG, CHOLHDL, LDLDIRECT in the last 72 hours. Thyroid Function Tests: Recent Labs    10/26/20 0329  TSH 3.123  FREET4 0.97   Anemia Panel: Recent Labs    10/24/20 2240  VITAMINB12 1,110*  FOLATE 19.0  FERRITIN 190  TIBC 209*  IRON 11*  RETICCTPCT 1.5   Sepsis Labs: Recent Labs  Lab 10/24/20 1918 10/24/20 2240 10/25/20 0859 10/25/20 1502  PROCALCITON  --  61.37  --   --   LATICACIDVEN 1.0  --  1.2 1.1    Recent Results (from the past 240 hour(s))  Culture, blood (x 2)     Status: None (Preliminary result)   Collection Time: 10/24/20  7:00 PM   Specimen: BLOOD  Result Value Ref Range Status   Specimen Description BLOOD LEFT FA  Final   Special Requests   Final    BOTTLES DRAWN AEROBIC AND ANAEROBIC Blood Culture results may not be optimal due to an  excessive volume of blood received in culture bottles   Culture   Final    NO GROWTH < 24 HOURS Performed at [facility], [address] [place], [place] [address]    Report Status PENDING  Incomplete  Resp Panel by RT-PCR (Flu A&B, Covid) Nasopharyngeal Swab     Status: None   Collection Time: 10/24/20  9:05 PM   Specimen: Nasopharyngeal Swab; Nasopharyngeal(NP) swabs in vial transport medium  Result Value Ref Range Status   SARS Coronavirus 2 by RT PCR NEGATIVE NEGATIVE Final    Comment: (NOTE) SARS-CoV-2 target nucleic acids are NOT DETECTED.  The SARS-CoV-2 RNA is generally detectable in upper respiratory specimens during the acute phase of infection. The lowest concentration of SARS-CoV-2 viral copies this assay can detect is 138 copies/mL. A negative result does not preclude SARS-Cov-2 infection and should not be used as the sole basis for treatment or other patient management decisions. A negative result may occur with  improper specimen collection/handling, submission of specimen other than nasopharyngeal swab, presence of viral mutation(s) within the areas targeted by this assay, and inadequate number of viral copies(<138 copies/mL). A negative result must be combined with clinical observations, patient history, and epidemiological information. The expected result is Negative.  Fact Sheet for Patients:  EntrepreneurPulse.com.au  Fact Sheet for Healthcare Providers:  IncredibleEmployment.be  This test is no t yet approved or cleared by the [place] FDA and  has been authorized for detection and/or diagnosis of SARS-CoV-2 by FDA under an Emergency Use Authorization (EUA). This EUA will remain  in effect (meaning this test can be used) for the duration of the COVID-19 declaration under Section 564(b)(1) of the Act, 21 U.S.C.section 360bbb-3(b)(1), unless the authorization is terminated  or revoked sooner.        Influenza A by PCR NEGATIVE NEGATIVE Final   Influenza B by PCR NEGATIVE NEGATIVE Final    Comment: (NOTE) The Xpert Xpress SARS-CoV-2/FLU/RSV plus assay is intended as an aid in the diagnosis of influenza from Nasopharyngeal  swab specimens and should not be used as a sole basis for treatment. Nasal washings and aspirates are unacceptable for Xpert Xpress SARS-CoV-2/FLU/RSV testing.  Fact Sheet for Patients: EntrepreneurPulse.com.au  Fact Sheet for Healthcare Providers: IncredibleEmployment.be  This test is not yet approved or cleared by the [place] FDA and has been authorized for detection and/or diagnosis of SARS-CoV-2 by FDA under an Emergency Use Authorization (EUA). This EUA will remain in effect (meaning this test can be used) for the duration of the COVID-19 declaration under Section 564(b)(1) of the Act, 21 U.S.C. section 360bbb-3(b)(1), unless the authorization is terminated or revoked.  Performed at [facility], [address] [place], [place] [address]   Culture, group A strep     Status: None (Preliminary result)   Collection Time: 10/24/20 10:05 PM   Specimen: Throat  Result Value Ref Range Status   Specimen Description   Final    THROAT Performed at [facility], [address], [place], [place] [address]    Special Requests   Final    NONE Performed at [facility], [address], [place], [place] [address]    Culture   Final    CULTURE REINCUBATED FOR BETTER GROWTH Performed at [facility], [place] [address] [place], [place] [address]    Report Status PENDING  Incomplete  Culture, blood (x 2)     Status: None (Preliminary result)   Collection Time: 10/25/20  8:59 AM   Specimen: BLOOD  Result Value Ref Range Status   Specimen Description BLOOD RIGHT HAND  Final   Special Requests   Final    BOTTLES DRAWN AEROBIC AND ANAEROBIC Blood Culture adequate volume   Culture   Final     NO GROWTH < 24 HOURS Performed at [facility], [address] [place], [place] [address]    Report Status PENDING  Incomplete  MRSA PCR Screening     Status: None   Collection Time: 10/25/20  1:26 PM   Specimen: Nasopharyngeal  Result Value Ref Range Status   MRSA by PCR NEGATIVE NEGATIVE Final    Comment:        The GeneXpert MRSA Assay (FDA approved for NASAL specimens only), is one component of a comprehensive MRSA colonization surveillance program. It is not intended to diagnose MRSA infection nor to guide or monitor treatment for MRSA infections. Performed at [facility], [address], [place],  [address]      Radiology Studies: CT Head Wo Contrast  Result Date: 10/24/2020 CLINICAL DATA:  Altered mental status. EXAM: CT HEAD WITHOUT CONTRAST TECHNIQUE: Contiguous axial images were obtained from the base of the skull through the vertex without intravenous contrast. COMPARISON:  None. FINDINGS: Brain: No evidence of acute infarction, hemorrhage, hydrocephalus, extra-axial collection or mass lesion/mass effect. Vascular: No hyperdense vessel or unexpected calcification. Skull: Normal. Negative for fracture or focal lesion. Sinuses/Orbits: No acute finding. Other: None. IMPRESSION: No acute intracranial pathology. Electronically Signed   By: Virgina Norfolk M.D.   On: 10/24/2020 21:01   DG Chest Port 1 View  Result Date: 10/26/2020 CLINICAL DATA:  Chronic CHF. More to [facility]. Chronic lymphedema. Chronic respiratory failure. EXAM: PORTABLE CHEST 1 VIEW COMPARISON:  10/24/2020.  CT 09/27/2020. FINDINGS: Cardiomegaly with pulmonary venous congestion bilateral interstitial prominence consistent with CHF. Pneumonitis cannot be excluded. A component of underlying chronic interstitial disease cannot be excluded. Given interstitial changes previously identified pulmonary nodules described on CT of 09/27/2020 not well visualized.  Follow-up CT can be obtained.  No pleural effusion or pneumothorax. IMPRESSION: Cardiomegaly with pulmonary venous congestion and bilateral interstitial prominence consistent with CHF. Pneumonitis cannot be excluded. A component of underlying chronic interstitial disease cannot be excluded. Electronically Signed   By: Marcello Moores  Register   On: 10/26/2020 12:44   DG Chest Portable 1 View  Result Date: 10/24/2020 CLINICAL DATA:  Shortness of breath EXAM: PORTABLE CHEST 1 VIEW COMPARISON:  Chest radiograph dated 10/05/2020 FINDINGS: The heart is enlarged. Vascular calcifications are seen in the aortic arch. Moderate patchy bilateral interstitial and airspace opacities appear increased since 10/05/2020. A small left pleural effusion may contribute. There is no right pleural effusion. There is no pneumothorax. The osseous structures are intact. IMPRESSION: Increased moderate bilateral interstitial and airspace opacities may represent pulmonary edema and/or pneumonia. A small left pleural effusion may contribute. Electronically Signed   By: Zerita Boers M.D.   On: 10/24/2020 19:28    Scheduled Meds: . diltiazem  15 mg Intravenous Once  . potassium chloride  10 mEq Oral QHS  . sodium chloride flush  3 mL Intravenous Q12H   Continuous Infusions: . sodium chloride 250 mL (10/25/20 1755)  . sodium chloride Stopped (10/26/20 1300)  . amiodarone 30 mg/hr (10/26/20 1248)  . azithromycin 250 mL/hr at 10/25/20 1652  . cefTRIAXone (ROCEPHIN)  IV 2 g (10/25/20 1756)  . diltiazem (CARDIZEM) infusion       LOS: 2 days   Marylu Lund, MD [facility] Pager On Amion  If 7PM-7AM, please contact night-coverage 10/26/2020, 3:50 PM

## 2020-10-26 NOTE — Progress Notes (Signed)
attempted to call Husband mike to inform of patients transfer to ICU 10. Unable able to get him to answer and no voicemail at this time. Will try to reach him again in one hour.

## 2020-10-27 ENCOUNTER — Inpatient Hospital Stay: Payer: Medicare HMO

## 2020-10-27 ENCOUNTER — Other Ambulatory Visit: Payer: Self-pay

## 2020-10-27 DIAGNOSIS — R6520 Severe sepsis without septic shock: Secondary | ICD-10-CM

## 2020-10-27 DIAGNOSIS — I480 Paroxysmal atrial fibrillation: Secondary | ICD-10-CM

## 2020-10-27 DIAGNOSIS — D696 Thrombocytopenia, unspecified: Secondary | ICD-10-CM

## 2020-10-27 DIAGNOSIS — I5032 Chronic diastolic (congestive) heart failure: Secondary | ICD-10-CM | POA: Diagnosis not present

## 2020-10-27 DIAGNOSIS — A419 Sepsis, unspecified organism: Principal | ICD-10-CM

## 2020-10-27 DIAGNOSIS — Z6841 Body Mass Index (BMI) 40.0 and over, adult: Secondary | ICD-10-CM

## 2020-10-27 DIAGNOSIS — N179 Acute kidney failure, unspecified: Secondary | ICD-10-CM | POA: Diagnosis not present

## 2020-10-27 DIAGNOSIS — I5033 Acute on chronic diastolic (congestive) heart failure: Secondary | ICD-10-CM | POA: Diagnosis not present

## 2020-10-27 LAB — URINE CULTURE: Culture: NO GROWTH

## 2020-10-27 LAB — COMPREHENSIVE METABOLIC PANEL
ALT: 24 U/L (ref 0–44)
AST: 34 U/L (ref 15–41)
Albumin: 2.1 g/dL — ABNORMAL LOW (ref 3.5–5.0)
Alkaline Phosphatase: 139 U/L — ABNORMAL HIGH (ref 38–126)
Anion gap: 9 (ref 5–15)
BUN: 59 mg/dL — ABNORMAL HIGH (ref 8–23)
CO2: 28 mmol/L (ref 22–32)
Calcium: 7.9 mg/dL — ABNORMAL LOW (ref 8.9–10.3)
Chloride: 102 mmol/L (ref 98–111)
Creatinine, Ser: 2.1 mg/dL — ABNORMAL HIGH (ref 0.44–1.00)
GFR, Estimated: 26 mL/min — ABNORMAL LOW (ref >60)
Glucose, Bld: 130 mg/dL — ABNORMAL HIGH (ref 70–99)
Potassium: 4.3 mmol/L (ref 3.5–5.1)
Sodium: 139 mmol/L (ref 135–145)
Total Bilirubin: 1.1 mg/dL (ref 0.3–1.2)
Total Protein: 6.5 g/dL (ref 6.5–8.1)

## 2020-10-27 LAB — CBC
HCT: 36.7 % (ref 36.0–46.0)
Hemoglobin: 11.6 g/dL — ABNORMAL LOW (ref 12.0–15.0)
MCH: 30.4 pg (ref 26.0–34.0)
MCHC: 31.6 g/dL (ref 30.0–36.0)
MCV: 96.1 fL (ref 80.0–100.0)
Platelets: 112 10*3/uL — ABNORMAL LOW (ref 150–400)
RBC: 3.82 MIL/uL — ABNORMAL LOW (ref 3.87–5.11)
RDW: 15.6 % — ABNORMAL HIGH (ref 11.5–15.5)
WBC: 9.1 10*3/uL (ref 4.0–10.5)
nRBC: 0 % (ref 0.0–0.2)

## 2020-10-27 LAB — CULTURE, GROUP A STREP (THRC)

## 2020-10-27 MED ORDER — DILTIAZEM HCL ER COATED BEADS 120 MG PO CP24
240.0000 mg | ORAL_CAPSULE | Freq: Every day | ORAL | Status: DC
  Administered 2020-10-27 – 2020-11-01 (×6): 240 mg via ORAL
  Filled 2020-10-27 (×3): qty 2
  Filled 2020-10-27: qty 1
  Filled 2020-10-27 (×2): qty 2

## 2020-10-27 MED ORDER — METOPROLOL TARTRATE 25 MG PO TABS
25.0000 mg | ORAL_TABLET | Freq: Two times a day (BID) | ORAL | Status: DC
  Administered 2020-10-27 (×2): 25 mg via ORAL
  Filled 2020-10-27 (×2): qty 1

## 2020-10-27 NOTE — Progress Notes (Signed)
Patient ID: Carmen [name], female   DOB: Sep 22, 1956, 64 y.o.   MRN: [id number] [facility] PROGRESS NOTE  Carmen [name] VPX:[id number] DOB: [birth year] DOA: 10/24/2020 PCP: Cletis Athens, MD  HPI/Subjective: Patient feeling a little bit better today.  Admitted from [facility] with shortness of breath found to have acute on chronic respiratory failure CHF exacerbation and acute kidney injury and pneumonia.  Objective: Vitals:   10/27/20 1400 10/27/20 1427  BP: 112/74 121/61  Pulse: 84 63  Resp: (!) 21 18  Temp:  98.1 F (36.7 C)  SpO2: 93% 90%    Intake/Output Summary (Last 24 hours) at 10/27/2020 1521 Last data filed at 10/27/2020 1300 Gross per 24 hour  Intake 3043.32 ml  Output 300 ml  Net 2743.32 ml   Filed Weights   10/26/20 0425 10/27/20 0500 10/27/20 1427  Weight: (!) 137.8 kg (!) 140.3 kg (!) 141.6 kg    ROS: Review of Systems  Constitutional: Positive for malaise/fatigue.  Respiratory: Positive for cough and shortness of breath.   Cardiovascular: Negative for chest pain.  Gastrointestinal: Negative for abdominal pain, nausea and vomiting.   Exam: Physical Exam HENT:     Head: Normocephalic.     Mouth/Throat:     Pharynx: No oropharyngeal exudate.  Eyes:     General: Lids are normal.     Conjunctiva/sclera: Conjunctivae normal.     Pupils: Pupils are equal, round, and reactive to light.  Cardiovascular:     Rate and Rhythm: Normal rate. Rhythm irregularly irregular.     Heart sounds: Normal heart sounds, S1 normal and S2 normal.  Pulmonary:     Breath sounds: Examination of the right-lower field reveals decreased breath sounds and rhonchi. Examination of the left-lower field reveals decreased breath sounds and rhonchi. Decreased breath sounds and rhonchi present. No wheezing or rales.  Abdominal:     Palpations: Abdomen is soft.     Tenderness: There is no abdominal tenderness.  Musculoskeletal:     Right lower leg: Swelling present.     Left  lower leg: Swelling present.     Right ankle: Swelling present.     Left ankle: Swelling present.  Skin:    General: Skin is warm.     Comments: Chronic lower extremity discoloration and lymphedema.  Neurological:     Mental Status: She is alert and oriented to person, place, and time.       Data Reviewed: Basic Metabolic Panel: Recent Labs  Lab 10/24/20 1856 10/24/20 1918 10/25/20 0435 10/26/20 0329 10/27/20 0537  NA 139  --  138 139 139  K 4.1  --  4.1 4.0 4.3  CL 101  --  101 101 102  CO2 28  --  29 27 28   GLUCOSE 160*  --  130* 128* 130*  BUN 49*  --  53* 59* 59*  CREATININE 2.12*  --  2.42* 2.19* 2.10*  CALCIUM 8.2*  --  7.8* 8.0* 7.9*  MG  --  1.9  --   --   --    Liver Function Tests: Recent Labs  Lab 10/24/20 1856 10/26/20 0329 10/27/20 0537  AST 21 22 34  ALT 19 17 24   ALKPHOS 81 102 139*  BILITOT 1.5* 1.5* 1.1  PROT 7.1 6.8 6.5  ALBUMIN 2.8* 2.3* 2.1*   CBC: Recent Labs  Lab 10/24/20 1856 10/26/20 0329 10/27/20 0537  WBC 12.1* 7.2 9.1  NEUTROABS 11.1*  --   --   HGB 11.9* 11.2* 11.6*  HCT 37.6 35.1* 36.7  MCV 95.4 95.6 96.1  PLT 98* 85* 112*  BNP (last 3 results) Recent Labs    09/26/20 2213 10/03/20 0444 10/24/20 1918  BNP 123.2* 73.8 534.9*     Recent Results (from the past 240 hour(s))  Culture, blood (x 2)     Status: None (Preliminary result)   Collection Time: 10/24/20  7:00 PM   Specimen: BLOOD  Result Value Ref Range Status   Specimen Description BLOOD LEFT FA  Final   Special Requests   Final    BOTTLES DRAWN AEROBIC AND ANAEROBIC Blood Culture results may not be optimal due to an excessive volume of blood received in culture bottles   Culture   Final    NO GROWTH 2 DAYS Performed at [facility], [address], [place], [place] [address]    Report Status PENDING  Incomplete  Resp Panel by RT-PCR (Flu A&B, Covid) Nasopharyngeal Swab     Status: None   Collection Time: 10/24/20  9:05 PM   Specimen:  Nasopharyngeal Swab; Nasopharyngeal(NP) swabs in vial transport medium  Result Value Ref Range Status   SARS Coronavirus 2 by RT PCR NEGATIVE NEGATIVE Final    Comment: (NOTE) SARS-CoV-2 target nucleic acids are NOT DETECTED.  The SARS-CoV-2 RNA is generally detectable in upper respiratory specimens during the acute phase of infection. The lowest concentration of SARS-CoV-2 viral copies this assay can detect is 138 copies/mL. A negative result does not preclude SARS-Cov-2 infection and should not be used as the sole basis for treatment or other patient management decisions. A negative result may occur with  improper specimen collection/handling, submission of specimen other than nasopharyngeal swab, presence of viral mutation(s) within the areas targeted by this assay, and inadequate number of viral copies(<138 copies/mL). A negative result must be combined with clinical observations, patient history, and epidemiological information. The expected result is Negative.  Fact Sheet for Patients:  EntrepreneurPulse.com.au  Fact Sheet for Healthcare Providers:  IncredibleEmployment.be  This test is no t yet approved or cleared by the [place] FDA and  has been authorized for detection and/or diagnosis of SARS-CoV-2 by FDA under an Emergency Use Authorization (EUA). This EUA will remain  in effect (meaning this test can be used) for the duration of the COVID-19 declaration under Section 564(b)(1) of the Act, 21 U.S.C.section 360bbb-3(b)(1), unless the authorization is terminated  or revoked sooner.       Influenza A by PCR NEGATIVE NEGATIVE Final   Influenza B by PCR NEGATIVE NEGATIVE Final    Comment: (NOTE) The Xpert Xpress SARS-CoV-2/FLU/RSV plus assay is intended as an aid in the diagnosis of influenza from Nasopharyngeal swab specimens and should not be used as a sole basis for treatment. Nasal washings and aspirates are unacceptable for  Xpert Xpress SARS-CoV-2/FLU/RSV testing.  Fact Sheet for Patients: EntrepreneurPulse.com.au  Fact Sheet for Healthcare Providers: IncredibleEmployment.be  This test is not yet approved or cleared by the [place] FDA and has been authorized for detection and/or diagnosis of SARS-CoV-2 by FDA under an Emergency Use Authorization (EUA). This EUA will remain in effect (meaning this test can be used) for the duration of the COVID-19 declaration under Section 564(b)(1) of the Act, 21 U.S.C. section 360bbb-3(b)(1), unless the authorization is terminated or revoked.  Performed at [facility], [address] [place], [place] [address]   Culture, group A strep     Status: None   Collection Time: 10/24/20 10:05 PM   Specimen: Throat  Result Value Ref Range Status   Specimen Description   Final    THROAT Performed at [facility], [address], [place], [place] [address]    Special Requests   Final    NONE Performed at [facility], [address] [place], [place] [address]    Culture   Final    NO GROUP A STREP (S.PYOGENES) ISOLATED Performed at [facility], [place] [address], [place], [place] [address]    Report Status 10/27/2020 FINAL  Final  Culture, blood (x 2)     Status: None (Preliminary result)   Collection Time: 10/25/20  8:59 AM   Specimen: BLOOD  Result Value Ref Range Status   Specimen Description BLOOD RIGHT HAND  Final   Special Requests   Final    BOTTLES DRAWN AEROBIC AND ANAEROBIC Blood Culture adequate volume   Culture   Final    NO GROWTH 2 DAYS Performed at [facility], [address] [place], [place] [address]    Report Status PENDING  Incomplete  MRSA PCR Screening     Status: None   Collection Time: 10/25/20  1:26 PM   Specimen: Nasopharyngeal  Result Value Ref Range Status   MRSA by PCR NEGATIVE NEGATIVE Final    Comment:        The GeneXpert MRSA Assay  (FDA approved for NASAL specimens only), is one component of a comprehensive MRSA colonization surveillance program. It is not intended to diagnose MRSA infection nor to guide or monitor treatment for MRSA infections. Performed at [facility], [address] [place], [place] [address]   Urine Culture     Status: None   Collection Time: 10/25/20  4:41 PM   Specimen: Urine, Random  Result Value Ref Range Status   Specimen Description   Final    URINE, RANDOM Performed at [facility], [address], [place], [place] [address]    Special Requests   Final    NONE Performed at [facility], [address], [place], [place] [address]    Culture   Final    NO GROWTH Performed at [facility], [place] [address] [place], [place] [address]    Report Status 10/27/2020 FINAL  Final     Studies: DG Chest Port 1 View  Result Date: 10/27/2020 CLINICAL DATA:  Heart failure. EXAM: PORTABLE CHEST 1 VIEW COMPARISON:  October 26, 2020. FINDINGS: Stable cardiomegaly with mild central pulmonary vascular congestion. No pneumothorax or pleural effusion is noted. Stable bilateral interstitial densities are noted concerning for pulmonary edema. Bony thorax is unremarkable. IMPRESSION: Stable cardiomegaly with mild central pulmonary vascular congestion and bilateral interstitial densities concerning for pulmonary edema. Electronically Signed   By: Marijo Conception M.D.   On: 10/27/2020 08:44   DG Chest Port 1 View  Result Date: 10/26/2020 CLINICAL DATA:  Chronic CHF. [facility] to [facility]. Chronic lymphedema. Chronic respiratory failure. EXAM: PORTABLE CHEST 1 VIEW COMPARISON:  10/24/2020.  CT 09/27/2020. FINDINGS: Cardiomegaly with pulmonary venous congestion bilateral interstitial prominence consistent with CHF. Pneumonitis cannot be excluded. A component of underlying chronic interstitial disease cannot be excluded. Given interstitial changes previously identified pulmonary  nodules described on CT of 09/27/2020 not well visualized. Follow-up CT can be obtained. No pleural effusion or pneumothorax. IMPRESSION: Cardiomegaly with pulmonary venous congestion and bilateral interstitial prominence consistent with CHF. Pneumonitis cannot be excluded. A component of underlying chronic interstitial disease cannot be excluded. Electronically Signed   By:  [place]   On: 10/26/2020 12:44    Scheduled Meds: . Chlorhexidine Gluconate Cloth  6 each Topical Daily  . diltiazem  240 mg Oral Daily  . doxycycline  100 mg Oral Q12H  . metoprolol tartrate  25 mg Oral BID  . potassium chloride  10 mEq Oral QHS  . sodium chloride flush  3 mL Intravenous Q12H   Continuous Infusions: . sodium chloride 20 mL/hr at 10/26/20 0106  . sodium chloride 40 mL/hr at 10/27/20 1300  . amiodarone 30 mg/hr (10/27/20 1334)  . cefTRIAXone (ROCEPHIN)  IV Stopped (10/26/20 2041)    Assessment/Plan:  1. Acute on chronic hypoxic respiratory failure due to pneumonia and severe sepsis.  On 6 L she did have a pulse ox of 88% (<90%) on presentation.  Currently on 6 L.  Chronically wears 2 to 4 L at home. 2. Severe sepsis secondary to pneumonia, present on admission.  Patient also had acute kidney injury.  Continue Rocephin and doxycycline.  Procalcitonin very elevated at 61 will repeat tomorrow. 3. Acute renal failure.  Creatinine increased by 0.3 (>.3mg /dl within 48hours) from 2.12 on presentation to 2.42 the following day.  Also had increase in creatinine from baseline of 0.94 up to 2.42 (greater than 1.5 times baseline within 7 days).  Holding diuresis and giving gentle fluids. 4. Chronic diastolic congestive heart failure.  Monitor closely while fluids being given.  Holding Lasix. 5. New onset atrial fibrillation on amiodarone IV, metoprolol and Cardizem.  Holding anticoagulation with thrombocytopenia 6. Thrombocytopenia likely secondary to infectious process.  Platelet count of 112  today. 7. Weakness.  Physical therapy evaluation 8. Morbid obesity with BMI 58.99.      Code Status:     Code Status Orders  (From admission, onward)         Start     Ordered   10/26/20 1721  Do not attempt resuscitation (DNR)  Continuous       Question Answer Comment  In the event of cardiac or respiratory ARREST Do not call a "code blue"   In the event of cardiac or respiratory ARREST Do not perform Intubation, CPR, defibrillation or ACLS   In the event of cardiac or respiratory ARREST Use medication by any route, position, wound care, and other measures to relive pain and suffering. May use oxygen, suction and manual treatment of airway obstruction as needed for comfort.      10/26/20 1720        Code Status History    Date Active Date Inactive Code Status Order ID Comments User Context   10/24/2020 2124 10/26/2020 1720 Full Code [id number]  Athena Masse, MD ED   09/27/2020 0215 10/08/2020 2252 Full Code [id number]  Athena Masse, MD ED   Advance Care Planning Activity     Family Communication: Tried to call husband Disposition Plan: Status is: Inpatient  Dispo: The patient is from: Home              Anticipated d/c is to: Rehab              Anticipated d/c date is: Likely will need at least 3 days              Patient currently not medically stable.  Currently on amiodarone drip.  Consultants:  Cardiology  Time spent: 28 minutes  [place]

## 2020-10-27 NOTE — Progress Notes (Signed)
Shift summary:  - Dilt drip transitioned off.  - Orders received for transfer to PCU.

## 2020-10-27 NOTE — Plan of Care (Signed)

## 2020-10-27 NOTE — Progress Notes (Signed)
Patient transferred from CCU at this time by CCU nurse. Remains on continuous amio gtt at 30mg /hr. Afib heart rate noted 90s-110's. Patient drowsy upon transfer however remains A/Ox4. Family at bedside. VSS WNL. 6L of oxygen intact via nasal cannula. No labored breathing no CP.Pure wick in place. Patient settled in room and oriented to new room accessories and remote/call bell settings. Will continue to monitor.

## 2020-10-27 NOTE — Progress Notes (Signed)
PT Cancellation Note  Patient Details Name: Carmen [name] MRN: [id number] DOB: [birth year]   Cancelled Treatment:    Reason Eval/Treat Not Completed: Patient declined, no reason specified.  PT consult received.  Chart reviewed.  Pt resting in bed upon PT arrival.  Attempted to encourage pt to participate in therapy but pt declining physical therapy (pt did not state why when asked multiple times).  Nurse reports pt has been drowsy today.  Will re-attempt PT evaluation at a later date/time.  Leitha Bleak, PT 10/27/20, 2:48 PM

## 2020-10-27 NOTE — Patient Outreach (Signed)
[facility] [facility]) Care Management  10/27/2020  Carmen [name] 23-Mar-1956 [id number]   Referral Date: 10/27/20 Referral Source: [employer] Report Date of Discharge: 10/24/20 Facility:  [place]: [facility]   Referral received.  No outreach warranted at this time.  Patient currently hospitalized.  Plan: RN CM will close case.  [name], RN, MSN [place] [facility] Care Management Coordinator Direct Line [phone] Cell [phone] Toll Free: [phone]  Fax: [phone]

## 2020-10-27 NOTE — Plan of Care (Signed)
  Problem: Health Behavior/Discharge Planning: Goal: Ability to manage health-related needs will improve Outcome: Progressing   Problem: Clinical Measurements: Goal: Ability to maintain clinical measurements within normal limits will improve Outcome: Progressing   Problem: Clinical Measurements: Goal: Diagnostic test results will improve Outcome: Progressing   

## 2020-10-27 NOTE — Progress Notes (Signed)
[facility] Encounter Note  Patient: Carmen [name] / Admit Date: 10/24/2020 / Date of Encounter: 10/27/2020, 7:19 AM   Subjective: 12/1 patient overall feels the same as she did yesterday.  She is hungry but has not had any episodes of chest discomfort anginal symptoms or congestive heart failure at this time.  She still does have significant lower extremity edema and lymphedema unchanged from before.  She was placed in the ICU for better control of her heart rate until further ability to treat.  This was treated with continued intravenous amiodarone as well as diltiazem drip.  Overnight heart rate control was about 100 bpm.  The patient does have some lower and blood pressure although this may be due to some volume depletion from IV diuresis.  There is additional concerns of hypoxia although will reevaluate at this time to assess need for changes in diuresis.  Transition to oral medication management will need to ensue  Review of Systems: Positive for: Shortness of breath Negative for: Vision change, hearing change, syncope, dizziness, nausea, vomiting,diarrhea, bloody stool, stomach pain, cough, congestion, diaphoresis, urinary frequency, urinary pain,skin lesions, skin rashes Others previously listed  Objective: Telemetry: Atrial fibrillation with variable heart rate Physical Exam: Blood pressure 97/62, pulse (!) 55, temperature 98.2 F (36.8 C), temperature source Oral, resp. rate 17, height 5\' 1"  (1.549 m), weight (!) 140.3 kg, SpO2 (!) 87 %. Body mass index is 58.44 kg/m. General: Well developed, well nourished, in no acute distress. Head: Normocephalic, atraumatic, sclera non-icteric, no xanthomas, nares are without discharge. Neck: No apparent masses Lungs: Normal respirations with few wheezes, some rhonchi, no rales , few crackles   Heart: Irregular rate and rhythm, normal S1 S2, no murmur, no rub, no gallop, PMI is normal size and placement, carotid upstroke  normal without bruit, jugular venous pressure normal Abdomen: Soft, non-tender, distended with normoactive bowel sounds. No hepatosplenomegaly. Abdominal aorta is normal size without bruit Extremities: 2+ edema, no clubbing, no cyanosis, positive ulcers,  Peripheral: 2+ radial, 0+ femoral, 0+ dorsal pedal pulses Neuro: Alert and oriented. Moves all extremities spontaneously. Psych:  Responds to questions appropriately with a normal affect.   Intake/Output Summary (Last 24 hours) at 10/27/2020 0719 Last data filed at 10/27/2020 0500 Gross per 24 hour  Intake 2347.33 ml  Output 550 ml  Net 1797.33 ml    Inpatient Medications:  . Chlorhexidine Gluconate Cloth  6 each Topical Daily  . diltiazem  240 mg Oral Daily  . diltiazem  15 mg Intravenous Once  . doxycycline  100 mg Oral Q12H  . metoprolol tartrate  25 mg Oral BID  . potassium chloride  10 mEq Oral QHS  . sodium chloride flush  3 mL Intravenous Q12H   Infusions:  . sodium chloride 20 mL/hr at 10/26/20 0106  . sodium chloride 75 mL/hr at 10/27/20 0636  . amiodarone 30 mg/hr (10/27/20 0500)  . cefTRIAXone (ROCEPHIN)  IV Stopped (10/26/20 2041)  . diltiazem (CARDIZEM) infusion 10 mg/hr (10/27/20 0654)    Labs: Recent Labs    10/24/20 1918 10/25/20 0435 10/26/20 0329 10/27/20 0537  NA  --    < > 139 139  K  --    < > 4.0 4.3  CL  --    < > 101 102  CO2  --    < > 27 28  GLUCOSE  --    < > 128* 130*  BUN  --    < > 59* 59*  CREATININE  --    < >  2.19* 2.10*  CALCIUM  --    < > 8.0* 7.9*  MG 1.9  --   --   --    < > = values in this interval not displayed.   Recent Labs    10/26/20 0329 10/27/20 0537  AST 22 34  ALT 17 24  ALKPHOS 102 139*  BILITOT 1.5* 1.1  PROT 6.8 6.5  ALBUMIN 2.3* 2.1*   Recent Labs    10/24/20 1856 10/24/20 1856 10/26/20 0329 10/27/20 0537  WBC 12.1*   < > 7.2 9.1  NEUTROABS 11.1*  --   --   --   HGB 11.9*   < > 11.2* 11.6*  HCT 37.6   < > 35.1* 36.7  MCV 95.4   < > 95.6 96.1   PLT 98*   < > 85* 112*   < > = values in this interval not displayed.   No results for input(s): CKTOTAL, CKMB, TROPONINI in the last 72 hours. Invalid input(s): POCBNP No results for input(s): HGBA1C in the last 72 hours.   Weights: Filed Weights   10/25/20 0804 10/26/20 0425 10/27/20 0500  Weight: 136.1 kg (!) 137.8 kg (!) 140.3 kg     Radiology/Studies:  CT Head Wo Contrast  Result Date: 10/24/2020 CLINICAL DATA:  Altered mental status. EXAM: CT HEAD WITHOUT CONTRAST TECHNIQUE: Contiguous axial images were obtained from the base of the skull through the vertex without intravenous contrast. COMPARISON:  None. FINDINGS: Brain: No evidence of acute infarction, hemorrhage, hydrocephalus, extra-axial collection or mass lesion/mass effect. Vascular: No hyperdense vessel or unexpected calcification. Skull: Normal. Negative for fracture or focal lesion. Sinuses/Orbits: No acute finding. Other: None. IMPRESSION: No acute intracranial pathology. Electronically Signed   By: Virgina Norfolk M.D.   On: 10/24/2020 21:01   DG Chest Port 1 View  Result Date: 10/26/2020 CLINICAL DATA:  Chronic CHF. More to [facility]. Chronic lymphedema. Chronic respiratory failure. EXAM: PORTABLE CHEST 1 VIEW COMPARISON:  10/24/2020.  CT 09/27/2020. FINDINGS: Cardiomegaly with pulmonary venous congestion bilateral interstitial prominence consistent with CHF. Pneumonitis cannot be excluded. A component of underlying chronic interstitial disease cannot be excluded. Given interstitial changes previously identified pulmonary nodules described on CT of 09/27/2020 not well visualized. Follow-up CT can be obtained. No pleural effusion or pneumothorax. IMPRESSION: Cardiomegaly with pulmonary venous congestion and bilateral interstitial prominence consistent with CHF. Pneumonitis cannot be excluded. A component of underlying chronic interstitial disease cannot be excluded. Electronically Signed   By: Marcello Moores  Register   On: 10/26/2020  12:44   DG Chest Portable 1 View  Result Date: 10/24/2020 CLINICAL DATA:  Shortness of breath EXAM: PORTABLE CHEST 1 VIEW COMPARISON:  Chest radiograph dated 10/05/2020 FINDINGS: The heart is enlarged. Vascular calcifications are seen in the aortic arch. Moderate patchy bilateral interstitial and airspace opacities appear increased since 10/05/2020. A small left pleural effusion may contribute. There is no right pleural effusion. There is no pneumothorax. The osseous structures are intact. IMPRESSION: Increased moderate bilateral interstitial and airspace opacities may represent pulmonary edema and/or pneumonia. A small left pleural effusion may contribute. Electronically Signed   By: Zerita Boers M.D.   On: 10/24/2020 19:28   DG Chest Port 1 View  Result Date: 10/05/2020 CLINICAL DATA:  Shortness of breath. EXAM: PORTABLE CHEST 1 VIEW COMPARISON:  10/03/2020 FINDINGS: The heart is mildly enlarged but stable. Mild vascular congestion without overt pulmonary edema. No definite infiltrates or effusions. IMPRESSION: Cardiac enlargement and mild vascular congestion without overt pulmonary edema. Electronically Signed  By: Marijo Sanes M.D.   On: 10/05/2020 07:53   DG Chest Port 1 View  Result Date: 10/03/2020 CLINICAL DATA:  Dyspnea, CHF EXAM: PORTABLE CHEST 1 VIEW COMPARISON:  Chest radiograph from one day prior. FINDINGS: Stable cardiomediastinal silhouette with mild cardiomegaly. No pneumothorax. No pleural effusion. Borderline mild pulmonary edema, improved. No consolidative airspace disease. IMPRESSION: Borderline mild congestive heart failure, improved. Electronically Signed   By: Ilona Sorrel M.D.   On: 10/03/2020 15:02   DG Chest Port 1 View  Result Date: 10/02/2020 CLINICAL DATA:  Shortness of breath, acute on chronic diastolic heart failure, morbid obesity, chronic respiratory failure, history of endometrial cancer EXAM: PORTABLE CHEST 1 VIEW COMPARISON:  Portable exam 0651 hours compared  to 09/26/2020 FINDINGS: Enlargement of cardiac silhouette with pulmonary vascular congestion. Scattered interstitial infiltrate consistent with mild pulmonary edema and CHF. Atherosclerotic calcification and tortuosity of thoracic aorta. No pleural effusion or pneumothorax. Osseous structures unremarkable. IMPRESSION: Enlargement of cardiac silhouette with pulmonary vascular congestion and mild pulmonary edema. Electronically Signed   By: Lavonia Dana M.D.   On: 10/02/2020 12:32   ECHOCARDIOGRAM COMPLETE  Result Date: 09/27/2020    ECHOCARDIOGRAM REPORT   Patient Name:   Carmen [name] Date of Exam: 09/27/2020 Medical Rec #:  [id number]       Height:       62.0 in Accession #:    [id number]      Weight:       345.5 lb Date of Birth:  [birth year]       BSA:          2.412 m Patient Age:    63 years        BP:           118/60 mmHg Patient Gender: F               HR:           64 bpm. Exam Location:  [facility] Procedure: 2D Echo, Cardiac Doppler and Color Doppler Indications:     CHF- acute diastolic 732.20  History:         Patient has no prior history of Echocardiogram examinations.                  CHF.  Sonographer:     Sherrie Sport RDCS (AE) Referring Phys:  [id number] Athena Masse Diagnosing Phys: Kathlyn Sacramento MD  Sonographer Comments: No apical window, no subcostal window and Technically challenging study due to limited acoustic windows. IMPRESSIONS  1. Left ventricular ejection fraction, by estimation, is 55 to 60%. The left ventricle has normal function. Left ventricular endocardial border not optimally defined to evaluate regional wall motion. Left ventricular diastolic function could not be evaluated.  2. Right ventricular systolic function is normal. The right ventricular size is normal.  3. Left atrial size was moderately dilated.  4. The mitral valve is normal in structure. No evidence of mitral valve regurgitation. No evidence of mitral stenosis.  5. The aortic valve is normal in structure. Aortic valve  regurgitation is not visualized. Mild to moderate aortic valve sclerosis/calcification is present, without any evidence of aortic stenosis.  6. Very challenging image quality with limited views. FINDINGS  Left Ventricle: Left ventricular ejection fraction, by estimation, is 55 to 60%. The left ventricle has normal function. Left ventricular endocardial border not optimally defined to evaluate regional wall motion. The left ventricular internal cavity size was normal in size. There is no left ventricular hypertrophy.  Left ventricular diastolic function could not be evaluated. Right Ventricle: The right ventricular size is normal. No increase in right ventricular wall thickness. Right ventricular systolic function is normal. Left Atrium: Left atrial size was moderately dilated. Right Atrium: Right atrial size was normal in size. Pericardium: There is no evidence of pericardial effusion. Mitral Valve: The mitral valve is normal in structure. No evidence of mitral valve regurgitation. No evidence of mitral valve stenosis. Tricuspid Valve: The tricuspid valve is normal in structure. Tricuspid valve regurgitation is trivial. No evidence of tricuspid stenosis. Aortic Valve: The aortic valve is normal in structure. Aortic valve regurgitation is not visualized. Mild to moderate aortic valve sclerosis/calcification is present, without any evidence of aortic stenosis. Pulmonic Valve: The pulmonic valve was normal in structure. Pulmonic valve regurgitation is not visualized. No evidence of pulmonic stenosis. Aorta: The aortic root is normal in size and structure. Venous: The inferior vena cava was not well visualized. IAS/Shunts: No atrial level shunt detected by color flow Doppler.  LEFT VENTRICLE PLAX 2D LVIDd:         4.22 cm LVIDs:         2.75 cm LV PW:         1.14 cm LV IVS:        0.99 cm LVOT diam:     2.00 cm LVOT Area:     3.14 cm  LEFT ATRIUM         Index LA diam:    4.50 cm 1.87 cm/m                         PULMONIC VALVE AORTA                 PV Vmax:        0.92 m/s Ao Root diam: 2.40 cm PV Peak grad:   3.4 mmHg                       RVOT Peak grad: 16 mmHg   SHUNTS Systemic Diam: 2.00 cm Kathlyn Sacramento MD Electronically signed by Kathlyn Sacramento MD Signature Date/Time: 09/27/2020/12:38:09 PM    Final      Assessment and Recommendation  64 y.o. female with known acute on chronic diastolic dysfunction congestive heart failure severe chronic lymphedema acute kidney injury and new onset acute atrial fibrillation with rapid ventricular rate without evidence of acute coronary syndrome  Atrial fibrillation 1.  Continuation of intravenous amiodarone at this time until able to transition to other heart rate controlling medication management 2.  Addition of oral diltiazem 240 mg CD as well as low-dose beta-blocker metoprolol 25 mg twice per day for better heart rate control and potential for discontinuation of diltiazem drip when heart rate better controlled.  We will watch closely for lower blood pressure 3.  Anticoagulation and/or heparin has been held at this point due to severe low platelets and concerns for thrombocytopenia and/or heparin-induced thrombocytopenia.  We will have hospitalist reevaluate than significance of this with treatment  Acute on chronic diastolic dysfunction congestive heart failure 1.  Discontinuation of diuresis today due to concerns of continued acute kidney injury and the possibility of low blood pressure with intravascular volume depletion 2.  Chest x-ray for further evaluation of pulmonary edema 3.  Further compression hose if necessary for lower extremity edema and lymphedema which is chronic 4.  No further cardiac diagnostics necessary at this time  Signed, Serafina Royals M.D. FACC

## 2020-10-28 ENCOUNTER — Other Ambulatory Visit: Payer: Self-pay | Admitting: Internal Medicine

## 2020-10-28 DIAGNOSIS — N179 Acute kidney failure, unspecified: Secondary | ICD-10-CM | POA: Diagnosis not present

## 2020-10-28 DIAGNOSIS — J181 Lobar pneumonia, unspecified organism: Secondary | ICD-10-CM | POA: Diagnosis not present

## 2020-10-28 DIAGNOSIS — J9602 Acute respiratory failure with hypercapnia: Secondary | ICD-10-CM

## 2020-10-28 DIAGNOSIS — J9601 Acute respiratory failure with hypoxia: Secondary | ICD-10-CM

## 2020-10-28 DIAGNOSIS — A419 Sepsis, unspecified organism: Secondary | ICD-10-CM | POA: Diagnosis not present

## 2020-10-28 LAB — CBC
HCT: 38.1 % (ref 36.0–46.0)
Hemoglobin: 11.7 g/dL — ABNORMAL LOW (ref 12.0–15.0)
MCH: 30.2 pg (ref 26.0–34.0)
MCHC: 30.7 g/dL (ref 30.0–36.0)
MCV: 98.2 fL (ref 80.0–100.0)
Platelets: 173 10*3/uL (ref 150–400)
RBC: 3.88 MIL/uL (ref 3.87–5.11)
RDW: 16.1 % — ABNORMAL HIGH (ref 11.5–15.5)
WBC: 9.5 10*3/uL (ref 4.0–10.5)
nRBC: 0 % (ref 0.0–0.2)

## 2020-10-28 LAB — BASIC METABOLIC PANEL
Anion gap: 10 (ref 5–15)
BUN: 66 mg/dL — ABNORMAL HIGH (ref 8–23)
CO2: 25 mmol/L (ref 22–32)
Calcium: 8.2 mg/dL — ABNORMAL LOW (ref 8.9–10.3)
Chloride: 104 mmol/L (ref 98–111)
Creatinine, Ser: 2.1 mg/dL — ABNORMAL HIGH (ref 0.44–1.00)
GFR, Estimated: 26 mL/min — ABNORMAL LOW (ref >60)
Glucose, Bld: 130 mg/dL — ABNORMAL HIGH (ref 70–99)
Potassium: 4.9 mmol/L (ref 3.5–5.1)
Sodium: 139 mmol/L (ref 135–145)

## 2020-10-28 LAB — BLOOD GAS, ARTERIAL
Acid-Base Excess: 0.6 mmol/L (ref 0.0–2.0)
Bicarbonate: 29.4 mmol/L — ABNORMAL HIGH (ref 20.0–28.0)
FIO2: 0.44
O2 Saturation: 82.5 %
Patient temperature: 37
pCO2 arterial: 67 mmHg (ref 32.0–48.0)
pH, Arterial: 7.25 — ABNORMAL LOW (ref 7.350–7.450)
pO2, Arterial: 55 mmHg — ABNORMAL LOW (ref 83.0–108.0)

## 2020-10-28 LAB — PROCALCITONIN: Procalcitonin: 9.57 ng/mL

## 2020-10-28 MED ORDER — TRAZODONE HCL 50 MG PO TABS
50.0000 mg | ORAL_TABLET | Freq: Every day | ORAL | Status: DC
  Administered 2020-10-28 – 2020-10-29 (×2): 50 mg via ORAL
  Filled 2020-10-28 (×2): qty 1

## 2020-10-28 MED ORDER — METOPROLOL TARTRATE 25 MG PO TABS
12.5000 mg | ORAL_TABLET | Freq: Two times a day (BID) | ORAL | Status: DC
  Administered 2020-10-28 – 2020-10-31 (×6): 12.5 mg via ORAL
  Filled 2020-10-28 (×6): qty 1

## 2020-10-28 MED ORDER — AMIODARONE HCL 200 MG PO TABS
400.0000 mg | ORAL_TABLET | Freq: Every day | ORAL | Status: DC
  Administered 2020-10-28 – 2020-11-01 (×5): 400 mg via ORAL
  Filled 2020-10-28 (×5): qty 2

## 2020-10-28 MED ORDER — FUROSEMIDE 10 MG/ML IJ SOLN
4.0000 mg/h | INTRAVENOUS | Status: DC
  Administered 2020-10-28 – 2020-10-30 (×2): 4 mg/h via INTRAVENOUS
  Filled 2020-10-28 (×3): qty 20

## 2020-10-28 MED ORDER — APIXABAN 5 MG PO TABS
5.0000 mg | ORAL_TABLET | Freq: Two times a day (BID) | ORAL | Status: DC
  Administered 2020-10-28 – 2020-11-01 (×9): 5 mg via ORAL
  Filled 2020-10-28 (×9): qty 1

## 2020-10-28 MED ORDER — METOPROLOL TARTRATE 50 MG PO TABS
50.0000 mg | ORAL_TABLET | Freq: Two times a day (BID) | ORAL | Status: DC
  Administered 2020-10-28: 50 mg via ORAL
  Filled 2020-10-28: qty 1

## 2020-10-28 NOTE — TOC Initial Note (Signed)
Transition of Care [facility]) - Initial/Assessment Note    Patient Details  Name: Carmen [name] MRN: [id number] Date of Birth: [birth year]  Transition of Care [facility]) CM/SW Contact:    Kerin Salen, RN Phone Number: 10/28/2020, 10:25 AM  Clinical Narrative:  Spoke with patient in room, husband at bedside. Patient was living in [facility]. Patient states she prefers to go home, husband works however will be retiring soon. Husband did not answer, I did explain that the Physical Therapist will evaluate and make the apporpriate referral. Patient and husband receptive. Patient states she uses oxygen at home, 3L [place] continuous, service by [facility]. Ambulates with walker and cane at home, unable to walk now. ADL's with husband assistance. CM to continue to monitor and assist with discharge plan per PT/OT recommendation.                 Expected Discharge Plan: Skilled Nursing Facility Barriers to Discharge: Continued Medical Work up   Patient Goals and CMS Choice Patient states their goals for this hospitalization and ongoing recovery are:: I really want to go home, my husband will be able to care for me.   Choice offered to / list presented to : NA  Expected Discharge Plan and Services Expected Discharge Plan: [place] In-house Referral: Clinical Social Work   Post Acute Care Choice: [place] Living arrangements for the past 2 months: [place] [facility])                 DME Arranged: Oxygen DME Agency: NA (SNF will arrange.)       HH Arranged: NA [place] Agency: NA        Prior Living Arrangements/Services Living arrangements for the past 2 months: [place] [facility]) Lives with:: Facility Resident Patient language and need for interpreter reviewed:: Yes Do you feel safe going back to the place where you live?: No   I do not want to go back, will prefer to go home.  Need for  Family Participation in Patient Care: Yes (Comment) (Husband, Richard) Care giver support system in place?: Yes (comment)   Criminal Activity/Legal Involvement Pertinent to Current Situation/Hospitalization: No - Comment as needed  Activities of Daily Living Home Assistive Devices/Equipment: Walker (specify type) ADL Screening (condition at time of admission) Patient's cognitive ability adequate to safely complete daily activities?: Yes Is the patient deaf or have difficulty hearing?: No Does the patient have difficulty seeing, even when wearing glasses/contacts?: No Does the patient have difficulty concentrating, remembering, or making decisions?: No Patient able to express need for assistance with ADLs?: Yes Does the patient have difficulty dressing or bathing?: Yes Independently performs ADLs?: No Communication: Independent Dressing (OT): Needs assistance Is this a change from baseline?: Pre-admission baseline Grooming: Needs assistance Is this a change from baseline?: Pre-admission baseline Feeding: Independent Bathing: Needs assistance Is this a change from baseline?: Pre-admission baseline Toileting: Needs assistance Is this a change from baseline?: Pre-admission baseline In/Out Bed: Needs assistance Is this a change from baseline?: Pre-admission baseline Walks in Home: Needs assistance Is this a change from baseline?: Pre-admission baseline Does the patient have difficulty walking or climbing stairs?: Yes Weakness of Legs: Both Weakness of Arms/Hands: Both  Permission Sought/Granted Permission sought to share information with : Case Manager Permission granted to share information with : Yes, Verbal Permission Granted  Share Information with NAME: Todaro,Michael (Spouse) [phone]  Emotional Assessment Appearance:: Appears stated age Attitude/Demeanor/Rapport: Engaged Affect (typically observed): Accepting Orientation: : Oriented to Self, Oriented to  Place, Oriented to  Time, Oriented to Situation Alcohol / Substance Use: Not Applicable Psych Involvement: No (comment)  Admission diagnosis:  Shortness of breath [R06.02] Confusion [R41.0] Hypoxia [R09.02] Acute CHF (congestive heart failure) (HCC) [I50.9] Acute on chronic diastolic congestive heart failure (HCC) [I50.33] Cardiorenal syndrome with renal failure, stage 1-4 or unspecified chronic kidney disease, with heart failure ([place]) [I13.0] Patient Active Problem List   Diagnosis Date Noted  . Severe sepsis ([place])   . AF (paroxysmal atrial fibrillation) ([place])   . Acute renal failure ([place]) 10/24/2020  . Chronic acquired lymphedema 10/24/2020  . Leukocytosis 10/24/2020  . Thrombocytopenia ([place]) 10/24/2020  . Anemia 10/24/2020  . Acute on chronic respiratory failure with hypoxia ([place]) 09/27/2020  . Morbid obesity with BMI of 50.0-59.9, adult ([place]) 09/27/2020  . Chronic diastolic CHF (congestive heart failure) ([place]) 09/27/2020  . Elevated troponin 09/27/2020  . Acute on chronic diastolic congestive heart failure ([place]) 09/27/2020  . Acute CHF (congestive heart failure) ([place]) 09/27/2020  . Uterine carcinoma ([place]) 01/30/2012  . ABNORMAL GLANDULAR PAPANICOLAOU SMEAR OF CERVIX 10/10/2010  . SHORTNESS OF BREATH 09/30/2010  . OBESITY 09/02/2010  . KNEE PAIN, BILATERAL 09/02/2010   PCP:  Cletis Athens, MD Pharmacy:   [facility], [place] [address] Phone: [phone] Fax: [phone]     Social Determinants of Health (SDOH) Interventions    Readmission Risk Interventions Readmission Risk Prevention Plan 10/28/2020  Transportation Screening Complete  PCP or Specialist Appt within 3-5 Days Not Complete  Not Complete comments Pending discharge, will be scheduled by unit secretary.  [place] or Home Care Consult Complete  Social Work Consult for [place] Planning/Counseling Complete  Palliative Care  Screening Not Applicable  Medication Review Press photographer) Complete  Some recent data might be hidden

## 2020-10-28 NOTE — Progress Notes (Signed)
ABG was abnormal, [place] NP notified. Bipap ordered, respiratory therapist will apply.

## 2020-10-28 NOTE — Progress Notes (Signed)
Patient is drowsy and incoherent speech at times. Patient can grip hands but is very weak in arm strength. Patient answers questions appropriately. Oxygen saturation is 89% on 6L. [place], NP was notified, came bedside to evaluate and ordered an ABG.

## 2020-10-28 NOTE — Evaluation (Signed)
Physical Therapy Evaluation Patient Details Name: Carmen [name] MRN: [id number] DOB: Jul 27, 1956 Today's Date: 10/28/2020   History of Present Illness  Pt is a 64 y.o. female presenting to hospital 11/28 with AMS, fever, and SOB; recent hospitalization 10/31-11/11 for CHF exacerbation and associated hypoxia.  Pt admitted with acute on chronic diastolic CHF, acute on chronic respiratory failure with hypoxia, AKI, suspect PNA, possible UTI, thrombocytopenia, and anemia.  Pt with new onset a-fib with RVR.  PMH includes CHF, endometrial CA, htn, lymphedema, morbid obesity, OA, B knee pain.  Clinical Impression  Prior to hospital admission, pt was recently at [facility] (pt reports prior to recent hospitalizations and STR, was able to walk short distances with walker vs cane).  Attempted supine to sit with 2 assist but unable to come to full upright sitting posture (pt with significant posterior lean and reporting pain in LE's) so pt assisted with laying back down in bed again and repositioned for comfort.  Pt would benefit from skilled PT to address noted impairments and functional limitations (see below for any additional details).  Upon hospital discharge, pt would benefit from STR.    Follow Up Recommendations SNF    Equipment Recommendations  Wheelchair (measurements PT);Wheelchair cushion (measurements PT);Hospital bed;Other (comment) (hoyer lift)    Recommendations for Other Services OT consult     Precautions / Restrictions Precautions Precautions: Fall Restrictions Weight Bearing Restrictions: No Other Position/Activity Restrictions: bilateral LE lymphedema      Mobility  Bed Mobility Overal bed mobility: Needs Assistance Bed Mobility: Supine to Sit;Sit to Supine     Supine to sit: +2 for physical assistance;HOB elevated Sit to supine: +2 for physical assistance;HOB elevated   General bed mobility comments: assist for B LE's and trunk (and use of bed pad under pt)    Transfers                  General transfer comment: unable to come to full sitting posture to attempt  Ambulation/Gait             General Gait Details: unable to come to full sitting posture to attempt  Stairs            Wheelchair Mobility    Modified Rankin (Stroke Patients Only)       Balance                                             Pertinent Vitals/Pain Pain Assessment: 0-10 Pain Score: 0-No pain (0/10 at rest; LE pain with movement) Pain Location: B LE blisters sore against bed Pain Descriptors / Indicators: Sore;Tender Pain Intervention(s): Limited activity within patient's tolerance;Monitored during session;Repositioned  Vitals (HR and O2 on 6 L via nasal cannula) stable and WFL throughout treatment session.    Home Living Family/patient expects to be discharged to:: Private residence Living Arrangements: Spouse/significant other Available Help at Discharge: Family Type of Home: House Home Access: Stairs to enter Entrance Stairs-Rails: Left Entrance Stairs-Number of Steps: 3 Home Layout: One level Home Equipment: [place] - 2 wheels;Cane - single point;Shower seat - built in;Grab bars - tub/shower;Grab bars - toilet      Prior Function Level of Independence: Needs assistance   Gait / Transfers Assistance Needed: Pt ambulatory short distances in home with walker vs cane depending on how pt is feeling (about 1 month ago); 3-4 L home O2;  has been at [employer]        Extremity/Trunk Assessment   Upper Extremity Assessment Upper Extremity Assessment: Generalized weakness    Lower Extremity Assessment Lower Extremity Assessment: Generalized weakness RLE Deficits / Details: at least 3-/5 AROM DF/PF; hip flexion and knee flexion/extension grossly 2-/5 LLE Deficits / Details: at least 3-/5 AROM DF/PF; hip flexion and knee flexion/extension grossly 2-/5    Cervical / Trunk Assessment Cervical  / Trunk Assessment: Other exceptions Cervical / Trunk Exceptions: forward head/shoulders  Communication   Communication: No difficulties  Cognition Arousal/Alertness: Awake/alert Behavior During Therapy: WFL for tasks assessed/performed                                   General Comments: Pt very talkative but not always directly answering questions.  Oriented to person, place, time, and situation.      General Comments   Nursing cleared pt for participation in physical therapy.  Pt agreeable to PT session.    Exercises     Assessment/Plan    PT Assessment Patient needs continued PT services  PT Problem List Decreased strength;Decreased activity tolerance;Decreased balance;Decreased mobility;Decreased knowledge of use of DME;Cardiopulmonary status limiting activity;Decreased knowledge of precautions;Obesity;Pain       PT Treatment Interventions DME instruction;Gait training;Stair training;Functional mobility training;Balance training;Therapeutic exercise;Therapeutic activities;Patient/family education;Wheelchair mobility training    PT Goals (Current goals can be found in the Care Plan section)  Acute Rehab PT Goals Patient Stated Goal: to be able to get to chair PT Goal Formulation: With patient Time For Goal Achievement: 11/11/20 Potential to Achieve Goals: Fair    Frequency Min 2X/week   Barriers to discharge Decreased caregiver support      Co-evaluation               AM-PAC PT "6 Clicks" Mobility  Outcome Measure Help needed turning from your back to your side while in a flat bed without using bedrails?: Total Help needed moving from lying on your back to sitting on the side of a flat bed without using bedrails?: Total Help needed moving to and from a bed to a chair (including a wheelchair)?: Total Help needed standing up from a chair using your arms (e.g., wheelchair or bedside chair)?: Total Help needed to walk in hospital room?: Total Help  needed climbing 3-5 steps with a railing? : Total 6 Click Score: 6    End of Session Equipment Utilized During Treatment: Oxygen (6 L via nasal cannula) Activity Tolerance: Patient limited by pain;Patient limited by fatigue Patient left: in bed;with call bell/phone within reach;with bed alarm set;with nursing/sitter in room Nurse Communication: Mobility status;Precautions PT Visit Diagnosis: Other abnormalities of gait and mobility (R26.89);Muscle weakness (generalized) (M62.81);Difficulty in walking, not elsewhere classified (R26.2)    Time: 6073-7106 PT Time Calculation (min) (ACUTE ONLY): 32 min   Charges:   PT Evaluation $PT Eval Low Complexity: 1 Low PT Treatments $Therapeutic Activity: 8-22 mins       Leitha Bleak, PT 10/28/20, 3:29 PM

## 2020-10-28 NOTE — Plan of Care (Signed)
  Problem: Health Behavior/Discharge Planning: Goal: Ability to manage health-related needs will improve Outcome: Progressing   Problem: Clinical Measurements: Goal: Will remain free from infection Outcome: Progressing   Problem: Clinical Measurements: Goal: Respiratory complications will improve Outcome: Progressing   

## 2020-10-28 NOTE — Progress Notes (Signed)
[facility] Encounter Note  Patient: Carmen [name] / Admit Date: 10/24/2020 / Date of Encounter: 10/28/2020, 7:57 AM   Subjective: 12/1 patient overall feels the same as she did yesterday.  She is hungry but has not had any episodes of chest discomfort anginal symptoms or congestive heart failure at this time.  She still does have significant lower extremity edema and lymphedema unchanged from before.  She was placed in the ICU for better control of her heart rate until further ability to treat.  This was treated with continued intravenous amiodarone as well as diltiazem drip.  Overnight heart rate control was about 100 bpm.  The patient does have some lower and blood pressure although this may be due to some volume depletion from IV diuresis.  There is additional concerns of hypoxia although will reevaluate at this time to assess need for changes in diuresis.  Transition to oral medication management will need to ensue  12/2 patient has no significant change in overall condition of acute on chronic systolic dysfunction congestive heart failure with chest x-ray continuing to show some vascular congestion yesterday.  The patient was placed on CPAP machine for which has helped with her oxygenation overnight in which she has had sleep apnea for quite some time.  This is helped overnight quite well.  Her glomerular filtration rate is still significantly abnormal and with continued pulmonary edema will require further treatment.  Atrial fibrillation remains with good heart rate control although no spontaneous conversion to normal sinus rhythm.  The patient has had increased dosages of Cardizem and metoprolol for which has helped with heart rate control and will need to further adjustments today  Review of Systems: Positive for: Shortness of breath Negative for: Vision change, hearing change, syncope, dizziness, nausea, vomiting,diarrhea, bloody stool, stomach pain, cough, congestion,  diaphoresis, urinary frequency, urinary pain,skin lesions, skin rashes Others previously listed  Objective: Telemetry: Atrial fibrillation with variable heart rate Physical Exam: Blood pressure (!) 97/45, pulse 66, temperature (!) 97.2 F (36.2 C), temperature source Oral, resp. rate 14, height 5\' 1"  (1.549 m), weight (!) 142.1 kg, SpO2 96 %. Body mass index is 59.18 kg/m. General: Well developed, well nourished, in no acute distress. Head: Normocephalic, atraumatic, sclera non-icteric, no xanthomas, nares are without discharge. Neck: No apparent masses Lungs: Normal respirations with few wheezes, some rhonchi, no rales , few crackles   Heart: Irregular rate and rhythm, normal S1 S2, no murmur, no rub, no gallop, PMI is normal size and placement, carotid upstroke normal without bruit, jugular venous pressure normal Abdomen: Soft, non-tender, distended with normoactive bowel sounds. No hepatosplenomegaly. Abdominal aorta is normal size without bruit Extremities: 2+ edema, no clubbing, no cyanosis, positive ulcers,  Peripheral: 2+ radial, 0+ femoral, 0+ dorsal pedal pulses Neuro: Alert and oriented. Moves all extremities spontaneously. Psych:  Responds to questions appropriately with a normal affect.   Intake/Output Summary (Last 24 hours) at 10/28/2020 0757 Last data filed at 10/28/2020 0300 Gross per 24 hour  Intake 1873.75 ml  Output 775 ml  Net 1098.75 ml    Inpatient Medications:  . amiodarone  400 mg Oral Daily  . Chlorhexidine Gluconate Cloth  6 each Topical Daily  . diltiazem  240 mg Oral Daily  . doxycycline  100 mg Oral Q12H  . metoprolol tartrate  50 mg Oral BID  . potassium chloride  10 mEq Oral QHS  . sodium chloride flush  3 mL Intravenous Q12H   Infusions:  . sodium chloride  20 mL/hr at 10/26/20 0106  . sodium chloride 40 mL/hr at 10/28/20 0609  . cefTRIAXone (ROCEPHIN)  IV 2 g (10/27/20 2227)  . furosemide (LASIX) 200 mg in dextrose 5% 100 mL (2mg /mL) infusion       Labs: Recent Labs    10/27/20 0537 10/28/20 0437  NA 139 139  K 4.3 4.9  CL 102 104  CO2 28 25  GLUCOSE 130* 130*  BUN 59* 66*  CREATININE 2.10* 2.10*  CALCIUM 7.9* 8.2*   Recent Labs    10/26/20 0329 10/27/20 0537  AST 22 34  ALT 17 24  ALKPHOS 102 139*  BILITOT 1.5* 1.1  PROT 6.8 6.5  ALBUMIN 2.3* 2.1*   Recent Labs    10/27/20 0537 10/28/20 0437  WBC 9.1 9.5  HGB 11.6* 11.7*  HCT 36.7 38.1  MCV 96.1 98.2  PLT 112* 173   No results for input(s): CKTOTAL, CKMB, TROPONINI in the last 72 hours. Invalid input(s): POCBNP No results for input(s): HGBA1C in the last 72 hours.   Weights: Filed Weights   10/27/20 0500 10/27/20 1427 10/28/20 0605  Weight: (!) 140.3 kg (!) 141.6 kg (!) 142.1 kg     Radiology/Studies:  CT Head Wo Contrast  Result Date: 10/24/2020 CLINICAL DATA:  Altered mental status. EXAM: CT HEAD WITHOUT CONTRAST TECHNIQUE: Contiguous axial images were obtained from the base of the skull through the vertex without intravenous contrast. COMPARISON:  None. FINDINGS: Brain: No evidence of acute infarction, hemorrhage, hydrocephalus, extra-axial collection or mass lesion/mass effect. Vascular: No hyperdense vessel or unexpected calcification. Skull: Normal. Negative for fracture or focal lesion. Sinuses/Orbits: No acute finding. Other: None. IMPRESSION: No acute intracranial pathology. Electronically Signed   By: Virgina Norfolk M.D.   On: 10/24/2020 21:01   DG Chest Port 1 View  Result Date: 10/27/2020 CLINICAL DATA:  Heart failure. EXAM: PORTABLE CHEST 1 VIEW COMPARISON:  October 26, 2020. FINDINGS: Stable cardiomegaly with mild central pulmonary vascular congestion. No pneumothorax or pleural effusion is noted. Stable bilateral interstitial densities are noted concerning for pulmonary edema. Bony thorax is unremarkable. IMPRESSION: Stable cardiomegaly with mild central pulmonary vascular congestion and bilateral interstitial densities concerning  for pulmonary edema. Electronically Signed   By: Marijo Conception M.D.   On: 10/27/2020 08:44   DG Chest Port 1 View  Result Date: 10/26/2020 CLINICAL DATA:  Chronic CHF. [facility] to [facility]. Chronic lymphedema. Chronic respiratory failure. EXAM: PORTABLE CHEST 1 VIEW COMPARISON:  10/24/2020.  CT 09/27/2020. FINDINGS: Cardiomegaly with pulmonary venous congestion bilateral interstitial prominence consistent with CHF. Pneumonitis cannot be excluded. A component of underlying chronic interstitial disease cannot be excluded. Given interstitial changes previously identified pulmonary nodules described on CT of 09/27/2020 not well visualized. Follow-up CT can be obtained. No pleural effusion or pneumothorax. IMPRESSION: Cardiomegaly with pulmonary venous congestion and bilateral interstitial prominence consistent with CHF. Pneumonitis cannot be excluded. A component of underlying chronic interstitial disease cannot be excluded. Electronically Signed   By: Marcello Moores  Register   On: 10/26/2020 12:44   DG Chest Portable 1 View  Result Date: 10/24/2020 CLINICAL DATA:  Shortness of breath EXAM: PORTABLE CHEST 1 VIEW COMPARISON:  Chest radiograph dated 10/05/2020 FINDINGS: The heart is enlarged. Vascular calcifications are seen in the aortic arch. Moderate patchy bilateral interstitial and airspace opacities appear increased since 10/05/2020. A small left pleural effusion may contribute. There is no right pleural effusion. There is no pneumothorax. The osseous structures are intact. IMPRESSION: Increased moderate bilateral interstitial and airspace opacities  may represent pulmonary edema and/or pneumonia. A small left pleural effusion may contribute. Electronically Signed   By: Zerita Boers M.D.   On: 10/24/2020 19:28   DG Chest Port 1 View  Result Date: 10/05/2020 CLINICAL DATA:  Shortness of breath. EXAM: PORTABLE CHEST 1 VIEW COMPARISON:  10/03/2020 FINDINGS: The heart is mildly enlarged but stable. Mild vascular  congestion without overt pulmonary edema. No definite infiltrates or effusions. IMPRESSION: Cardiac enlargement and mild vascular congestion without overt pulmonary edema. Electronically Signed   By: Marijo Sanes M.D.   On: 10/05/2020 07:53   DG Chest Port 1 View  Result Date: 10/03/2020 CLINICAL DATA:  Dyspnea, CHF EXAM: PORTABLE CHEST 1 VIEW COMPARISON:  Chest radiograph from one day prior. FINDINGS: Stable cardiomediastinal silhouette with mild cardiomegaly. No pneumothorax. No pleural effusion. Borderline mild pulmonary edema, improved. No consolidative airspace disease. IMPRESSION: Borderline mild congestive heart failure, improved. Electronically Signed   By: Ilona Sorrel M.D.   On: 10/03/2020 15:02   DG Chest Port 1 View  Result Date: 10/02/2020 CLINICAL DATA:  Shortness of breath, acute on chronic diastolic heart failure, morbid obesity, chronic respiratory failure, history of endometrial cancer EXAM: PORTABLE CHEST 1 VIEW COMPARISON:  Portable exam 0651 hours compared to 09/26/2020 FINDINGS: Enlargement of cardiac silhouette with pulmonary vascular congestion. Scattered interstitial infiltrate consistent with mild pulmonary edema and CHF. Atherosclerotic calcification and tortuosity of thoracic aorta. No pleural effusion or pneumothorax. Osseous structures unremarkable. IMPRESSION: Enlargement of cardiac silhouette with pulmonary vascular congestion and mild pulmonary edema. Electronically Signed   By: Lavonia Dana M.D.   On: 10/02/2020 12:32     Assessment and Recommendation  64 y.o. female with known acute on chronic diastolic dysfunction congestive heart failure severe chronic lymphedema acute kidney injury and new onset acute atrial fibrillation with rapid ventricular rate without evidence of acute coronary syndrome  Atrial fibrillation 1.  Change amiodarone to oral amiodarone at 400 mg each day  2.  Addition of oral diltiazem 240 mg CD as well as 50 mg of metoprolol for good heart rate  control when changing amiodarone of above 3.  Anticoagulation and/or heparin has been held at this point due to severe low platelets and concerns for thrombocytopenia and/or heparin-induced thrombocytopenia.  We will have hospitalist reevaluate than significance of this with treatment  Acute on chronic diastolic dysfunction congestive heart failure 1.  Will consider Lasix drip for potential continued pulmonary vascular congestion by chest x-ray and concerns of hypoxia with severe acute kidney injury 2.  No further cardiac diagnostics necessary at this time 3.  Further compression hose if necessary for lower extremity edema and lymphedema which is chronic   Signed, Serafina Royals M.D. FACC

## 2020-10-28 NOTE — Evaluation (Signed)
Occupational Therapy Evaluation Patient Details Name: Carmen [name] MRN: [id number] DOB: [birth year] Today's Date: 10/28/2020    History of Present Illness Pt is a 64 y.o. female presenting to hospital 11/28 with AMS, fever, and SOB; recent hospitalization 10/31-11/11 for CHF exacerbation and associated hypoxia.  Pt admitted with acute on chronic diastolic CHF, acute on chronic respiratory failure with hypoxia, AKI, suspect PNA, possible UTI, thrombocytopenia, and anemia.  Pt with new onset a-fib with RVR.  PMH includes CHF, endometrial CA, htn, lymphedema, morbid obesity, OA, B knee pain.   Clinical Impression   Pt was seen for OT evaluation this date. Prior to hospital admission, pt was at rehab and had not been able to do much yet. At baseline, was ambulating with RW vs SPC. Pt lives with a spouse who assists for ADL. Currently pt demonstrates impairments as described below (See OT problem list) which functionally limit her ability to perform ADL/self-care tasks. Pt currently requires Max-Total A for LB ADL, 3+ assist for all mobility attempts, cues for commands. Pt would benefit from skilled OT services to address noted impairments and functional limitations (see below for any additional details) in order to maximize safety and independence while minimizing falls risk and caregiver burden. Upon hospital discharge, recommend STR to maximize pt safety and return to PLOF.     Follow Up Recommendations  SNF    Equipment Recommendations  Other (comment) (TBD, likely [facility]ari [facility])    Recommendations for Other Services       Precautions / Restrictions Precautions Precautions: Fall Restrictions Weight Bearing Restrictions: No Other Position/Activity Restrictions: bilateral LE lymphedema      Mobility Bed Mobility Overal bed mobility: Needs Assistance Bed Mobility: Supine to Sit;Sit to Supine     Supine to sit: +2 for physical assistance;HOB elevated Sit to supine: +2 for physical  assistance;HOB elevated   General bed mobility comments: Max A for scooting up in bed with bed repositioned to assist and cues for pt BUE positioning to assist with pulling up    Transfers                 General transfer comment: unable to come to full sitting posture to attempt    Balance                                           ADL either performed or assessed with clinical judgement   ADL Overall ADL's : Needs assistance/impaired                                       General ADL Comments: MAX A for LB ADL at bed level, transfer attempts unsafe     Vision         Perception     Praxis      Pertinent Vitals/Pain Pain Assessment: Faces Pain Score: 0-No pain (0/10 at rest; LE pain with movement) Faces Pain Scale: Hurts little more Pain Location: R hip Pain Descriptors / Indicators: Aching;Grimacing Pain Intervention(s): Limited activity within patient's tolerance;Monitored during session;Repositioned     Hand Dominance Right   Extremity/Trunk Assessment Upper Extremity Assessment Upper Extremity Assessment: Generalized weakness   Lower Extremity Assessment Lower Extremity Assessment: Generalized weakness RLE Deficits / Details: at least 3-/5 AROM DF/PF; hip flexion and knee flexion/extension grossly  2-/5 LLE Deficits / Details: at least 3-/5 AROM DF/PF; hip flexion and knee flexion/extension grossly 2-/5   Cervical / Trunk Assessment Cervical / Trunk Assessment: Other exceptions Cervical / Trunk Exceptions: forward head/shoulders   Communication Communication Communication: No difficulties   Cognition Arousal/Alertness: Awake/alert Behavior During Therapy: WFL for tasks assessed/performed Overall Cognitive Status: No family/caregiver present to determine baseline cognitive functioning                                 General Comments: easily distracted, follows commands with additional cues/time    General Comments       Exercises Other Exercises Other Exercises: Pt instructed in bed mobility, hand placement; PLB to support breath recovery   Shoulder Instructions      Home Living Family/patient expects to be discharged to:: Private residence Living Arrangements: Spouse/significant other Available Help at Discharge: Family Type of Home: House Home Access: Stairs to enter Technical brewer of Steps: 3 Entrance Stairs-Rails: Left Home Layout: One level     Bathroom Shower/Tub: Occupational psychologist: Handicapped height     Home Equipment: Environmental consultant - 2 wheels;Cane - single point;Shower seat - built in;Grab bars - tub/shower;Grab bars - toilet          Prior Functioning/Environment Level of Independence: Needs assistance  Gait / Transfers Assistance Needed: Pt ambulatory short distances in home with walker vs cane depending on how pt is feeling (about 1 month ago); 3-4 L home O2; has been at [facility]              OT Problem List: Decreased strength;Decreased cognition;Decreased activity tolerance;Impaired balance (sitting and/or standing);Decreased knowledge of use of DME or AE;Obesity;Cardiopulmonary status limiting activity      OT Treatment/Interventions: Self-care/ADL training;Therapeutic exercise;Energy conservation;DME and/or AE instruction;Therapeutic activities;Patient/family education;Balance training    OT Goals(Current goals can be found in the care plan section) Acute Rehab OT Goals Patient Stated Goal: get stronger OT Goal Formulation: With patient Time For Goal Achievement: 11/11/20 Potential to Achieve Goals: Good ADL Goals Pt Will Perform Grooming: with min assist;sitting;with set-up Pt Will Transfer to Toilet: with max assist;with +2 assist;bedside commode;stand pivot transfer Additional ADL Goal #1: Pt will utilize learned PLB during exertional activity with modified independence to support breath recovery/minimize SOB.   OT Frequency: Min 1X/week   Barriers to D/C:            Co-evaluation              AM-PAC OT "6 Clicks" Daily Activity     Outcome Measure Help from another person eating meals?: None Help from another person taking care of personal grooming?: A Little Help from another person toileting, which includes using toliet, bedpan, or urinal?: A Lot Help from another person bathing (including washing, rinsing, drying)?: A Lot Help from another person to put on and taking off regular upper body clothing?: A Little Help from another person to put on and taking off regular lower body clothing?: A Lot 6 Click Score: 16   End of Session Equipment Utilized During Treatment: Oxygen  Activity Tolerance: Patient limited by fatigue Patient left: in bed;with call bell/phone within reach;with bed alarm set  OT Visit Diagnosis: Other abnormalities of gait and mobility (R26.89);Muscle weakness (generalized) (M62.81)                Time: 1610-9604 OT Time Calculation (min): 14 min Charges:  OT  General Charges $OT Visit: 1 Visit OT Evaluation $OT Eval High Complexity: 1 High  Jeni Salles, MPH, MS, OTR/L ascom [phone] 10/28/20, 4:07 PM

## 2020-10-28 NOTE — Progress Notes (Signed)
Patient ID: Kathe Becton, female   DOB: [birth year], 64 y.o.   MRN: [id number] [facility] PROGRESS NOTE  SHANEDRA LAVE WEX:[id number] DOB: 1956-07-21 DOA: 10/24/2020 PCP: Cletis Athens, MD  HPI/Subjective: Patient seen this morning on BiPAP.  She states that she has some shortness of breath and did not sleep very well last night.  Was placed on BiPAP last night.  Admitted initially with acute kidney injury pneumonia and CHF.  Objective: Vitals:   10/28/20 0850 10/28/20 1133  BP: 98/69 (!) 91/57  Pulse: 97 (!) 56  Resp: 19 18  Temp: 97.7 F (36.5 C) 98.3 F (36.8 C)  SpO2: 93% 95%    Intake/Output Summary (Last 24 hours) at 10/28/2020 1205 Last data filed at 10/28/2020 1100 Gross per 24 hour  Intake 873.69 ml  Output 1075 ml  Net -201.31 ml   Filed Weights   10/27/20 0500 10/27/20 1427 10/28/20 0605  Weight: (!) 140.3 kg (!) 141.6 kg (!) 142.1 kg    ROS: Review of Systems  Respiratory: Positive for cough and shortness of breath.   Cardiovascular: Negative for chest pain.  Gastrointestinal: Negative for abdominal pain, nausea and vomiting.   Exam: Physical Exam HENT:     Head: Normocephalic.     Mouth/Throat:     Pharynx: No oropharyngeal exudate.  Eyes:     General: Lids are normal.     Conjunctiva/sclera: Conjunctivae normal.     Pupils: Pupils are equal, round, and reactive to light.  Cardiovascular:     Rate and Rhythm: Normal rate. Rhythm irregularly irregular.     Heart sounds: Normal heart sounds, S1 normal and S2 normal.  Pulmonary:     Breath sounds: Examination of the right-lower field reveals decreased breath sounds and rales. Examination of the left-lower field reveals decreased breath sounds and rales. Decreased breath sounds and rales present. No wheezing or rhonchi.  Abdominal:     Palpations: Abdomen is soft.     Tenderness: There is no abdominal tenderness.  Musculoskeletal:     Right lower leg: Swelling present.     Left lower leg:  Swelling present.  Skin:    General: Skin is warm.     Comments: Chronic bilateral lower extremity discoloration and lymphedema.  Neurological:     Mental Status: She is alert and oriented to person, place, and time.       Data Reviewed: Basic Metabolic Panel: Recent Labs  Lab 10/24/20 1856 10/24/20 1918 10/25/20 0435 10/26/20 0329 10/27/20 0537 10/28/20 0437  NA 139  --  138 139 139 139  K 4.1  --  4.1 4.0 4.3 4.9  CL 101  --  101 101 102 104  CO2 28  --  29 27 28 25   GLUCOSE 160*  --  130* 128* 130* 130*  BUN 49*  --  53* 59* 59* 66*  CREATININE 2.12*  --  2.42* 2.19* 2.10* 2.10*  CALCIUM 8.2*  --  7.8* 8.0* 7.9* 8.2*  MG  --  1.9  --   --   --   --    Liver Function Tests: Recent Labs  Lab 10/24/20 1856 10/26/20 0329 10/27/20 0537  AST 21 22 34  ALT 19 17 24   ALKPHOS 81 102 139*  BILITOT 1.5* 1.5* 1.1  PROT 7.1 6.8 6.5  ALBUMIN 2.8* 2.3* 2.1*   CBC: Recent Labs  Lab 10/24/20 1856 10/26/20 0329 10/27/20 0537 10/28/20 0437  WBC 12.1* 7.2 9.1 9.5  NEUTROABS 11.1*  --   --   --  HGB 11.9* 11.2* 11.6* 11.7*  HCT 37.6 35.1* 36.7 38.1  MCV 95.4 95.6 96.1 98.2  PLT 98* 85* 112* 173   CardBNP (last 3 results) Recent Labs    09/26/20 2213 10/03/20 0444 10/24/20 1918  BNP 123.2* 73.8 534.9*     Recent Results (from the past 240 hour(s))  Culture, blood (x 2)     Status: None (Preliminary result)   Collection Time: 10/24/20  7:00 PM   Specimen: BLOOD  Result Value Ref Range Status   Specimen Description BLOOD LEFT FA  Final   Special Requests   Final    BOTTLES DRAWN AEROBIC AND ANAEROBIC Blood Culture results may not be optimal due to an excessive volume of blood received in culture bottles   Culture   Final    NO GROWTH 3 DAYS Performed at [facility], [address] [place], [place] [address]    Report Status PENDING  Incomplete  Resp Panel by RT-PCR (Flu A&B, Covid) Nasopharyngeal Swab     Status: None   Collection Time:  10/24/20  9:05 PM   Specimen: Nasopharyngeal Swab; Nasopharyngeal(NP) swabs in vial transport medium  Result Value Ref Range Status   SARS Coronavirus 2 by RT PCR NEGATIVE NEGATIVE Final    Comment: (NOTE) SARS-CoV-2 target nucleic acids are NOT DETECTED.  The SARS-CoV-2 RNA is generally detectable in upper respiratory specimens during the acute phase of infection. The lowest concentration of SARS-CoV-2 viral copies this assay can detect is 138 copies/mL. A negative result does not preclude SARS-Cov-2 infection and should not be used as the sole basis for treatment or other patient management decisions. A negative result may occur with  improper specimen collection/handling, submission of specimen other than nasopharyngeal swab, presence of viral mutation(s) within the areas targeted by this assay, and inadequate number of viral copies(<138 copies/mL). A negative result must be combined with clinical observations, patient history, and epidemiological information. The expected result is Negative.  Fact Sheet for Patients:  EntrepreneurPulse.com.au  Fact Sheet for Healthcare Providers:  IncredibleEmployment.be  This test is no t yet approved or cleared by the [place] FDA and  has been authorized for detection and/or diagnosis of SARS-CoV-2 by FDA under an Emergency Use Authorization (EUA). This EUA will remain  in effect (meaning this test can be used) for the duration of the COVID-19 declaration under Section 564(b)(1) of the Act, 21 U.S.C.section 360bbb-3(b)(1), unless the authorization is terminated  or revoked sooner.       Influenza A by PCR NEGATIVE NEGATIVE Final   Influenza B by PCR NEGATIVE NEGATIVE Final    Comment: (NOTE) The Xpert Xpress SARS-CoV-2/FLU/RSV plus assay is intended as an aid in the diagnosis of influenza from Nasopharyngeal swab specimens and should not be used as a sole basis for treatment. Nasal washings  and aspirates are unacceptable for Xpert Xpress SARS-CoV-2/FLU/RSV testing.  Fact Sheet for Patients: EntrepreneurPulse.com.au  Fact Sheet for Healthcare Providers: IncredibleEmployment.be  This test is not yet approved or cleared by the [place] FDA and has been authorized for detection and/or diagnosis of SARS-CoV-2 by FDA under an Emergency Use Authorization (EUA). This EUA will remain in effect (meaning this test can be used) for the duration of the COVID-19 declaration under Section 564(b)(1) of the Act, 21 U.S.C. section 360bbb-3(b)(1), unless the authorization is terminated or revoked.  Performed at [facility], [address], [place], [place] [address]   Culture, group A strep     Status: None  Collection Time: 10/24/20 10:05 PM   Specimen: Throat  Result Value Ref Range Status   Specimen Description   Final    THROAT Performed at [facility], [address] [place], [place] [address]    Special Requests   Final    NONE Performed at [facility], [address], [place], [place] [address]    Culture   Final    NO GROUP A STREP (S.PYOGENES) ISOLATED Performed at [facility], [place] [address], [place], [place] [address]    Report Status 10/27/2020 FINAL  Final  Culture, blood (x 2)     Status: None (Preliminary result)   Collection Time: 10/25/20  8:59 AM   Specimen: BLOOD  Result Value Ref Range Status   Specimen Description BLOOD RIGHT HAND  Final   Special Requests   Final    BOTTLES DRAWN AEROBIC AND ANAEROBIC Blood Culture adequate volume   Culture   Final    NO GROWTH 3 DAYS Performed at [facility], [address] [place], [place] [address]    Report Status PENDING  Incomplete  MRSA PCR Screening     Status: None   Collection Time: 10/25/20  1:26 PM   Specimen: Nasopharyngeal  Result Value Ref Range Status   MRSA by PCR NEGATIVE NEGATIVE Final    Comment:         The GeneXpert MRSA Assay (FDA approved for NASAL specimens only), is one component of a comprehensive MRSA colonization surveillance program. It is not intended to diagnose MRSA infection nor to guide or monitor treatment for MRSA infections. Performed at [facility], [address] [place], [place] [address]   Urine Culture     Status: None   Collection Time: 10/25/20  4:41 PM   Specimen: Urine, Random  Result Value Ref Range Status   Specimen Description   Final    URINE, RANDOM Performed at [facility], [address], [place], [place] [address]    Special Requests   Final    NONE Performed at [facility], [address], [place], [place] [address]    Culture   Final    NO GROWTH Performed at [facility], [place] [address] [place], [place] [address]    Report Status 10/27/2020 FINAL  Final     Studies: DG Chest Port 1 View  Result Date: 10/27/2020 CLINICAL DATA:  Heart failure. EXAM: PORTABLE CHEST 1 VIEW COMPARISON:  October 26, 2020. FINDINGS: Stable cardiomegaly with mild central pulmonary vascular congestion. No pneumothorax or pleural effusion is noted. Stable bilateral interstitial densities are noted concerning for pulmonary edema. Bony thorax is unremarkable. IMPRESSION: Stable cardiomegaly with mild central pulmonary vascular congestion and bilateral interstitial densities concerning for pulmonary edema. Electronically Signed   By: Marijo Conception M.D.   On: 10/27/2020 08:44    Scheduled Meds: . amiodarone  400 mg Oral Daily  . Chlorhexidine Gluconate Cloth  6 each Topical Daily  . diltiazem  240 mg Oral Daily  . doxycycline  100 mg Oral Q12H  . metoprolol tartrate  50 mg Oral BID  . potassium chloride  10 mEq Oral QHS  . sodium chloride flush  3 mL Intravenous Q12H   Continuous Infusions: . sodium chloride 20 mL/hr at 10/26/20 0106  . cefTRIAXone (ROCEPHIN)  IV Stopped (10/27/20 2300)  . furosemide (LASIX) 200 mg  in dextrose 5% 100 mL (2mg /mL) infusion 4 mg/hr (10/28/20 0845)    Assessment/Plan:  1. Acute on chronic hypoxic and hypercarbic respiratory failure secondary to pneumonia and severe sepsis.  On 6 L she did have a pulse ox of 88% (less than 90%) on presentation.  Patient was placed on BiPAP last night when she had a pH of 7.25 (less than 7.35), a PCO2 of 67 and a PO2 of 55.  Patient will qualify for noninvasive ventilation at night.  Spoke with nursing staff to try to convert off BiPAP this morning over to nasal cannula.  Hopefully keep nasal cannula around 4 L if possible do not mine saturations around 88% 2. Severe sepsis secondary to pneumonia, present on admission.  Patient also had acute renal failure.  Patient on Rocephin and doxycycline.  Procalcitonin very elevated at 61 on presentation and down to 9.75 today. 3. Acute renal failure.  Creatinine increased by 0.3 (greater than 0.3 mg/dL within 48 hours).  Patient's creatinine was 2.12 on presentation went up to 2.42 the following day.  Patient's baseline creatinine is 0.94 (greater than 1.5 times baseline within 7 days).  Creatinine the same 2.10 today.  Discontinuing fluids and starting Lasix drip. 4. Acute on chronic diastolic congestive heart failure.  Cardiology started Lasix drip.  Continue to monitor creatinine closely. 5. Paroxysmal atrial fibrillation.  IV amiodarone switch over to oral.  On metoprolol and Cardizem.  We will start Eliquis for anticoagulation since platelet count has improved. 6. Weakness.  Physical therapy evaluation 7. Morbid obesity with a BMI of 59.18. 8. Thrombocytopenia.  Platelet count recovered.  This was likely secondary to sepsis.  HIT profile negative      Code Status:     Code Status Orders  (From admission, onward)         Start     Ordered   10/26/20 1721  Do not attempt resuscitation (DNR)  Continuous       Question Answer Comment  In the event of cardiac or respiratory ARREST Do not call a  "code blue"   In the event of cardiac or respiratory ARREST Do not perform Intubation, CPR, defibrillation or ACLS   In the event of cardiac or respiratory ARREST Use medication by any route, position, wound care, and other measures to relive pain and suffering. May use oxygen, suction and manual treatment of airway obstruction as needed for comfort.      10/26/20 1720        Code Status History    Date Active Date Inactive Code Status Order ID Comments User Context   10/24/2020 2124 10/26/2020 1720 Full Code [id number]  Athena Masse, MD ED   09/27/2020 0215 10/08/2020 2252 Full Code [id number]  Athena Masse, MD ED   Advance Care Planning Activity     Family Communication: Spoke with patient's son Grayland Ormond at [phone] Disposition Plan: Status is: Inpatient  Dispo: The patient is from: Rehab              Anticipated d/c is to: Rehab versus home with home health              Anticipated d/c date is: Since on Lasix drip now will likely need a few days here in the hospital in order to improve.  Likely disposition next week.              Patient currently not medically stable for discharge at this point.  Need to watch creatinine, respiratory status closely with diuresis.  Consultants:  Cardiology  Antibiotics:  Rocephin  Doxycycline  Time spent:  27 minutes  Jermiah Howton [employer]

## 2020-10-28 NOTE — Progress Notes (Signed)
    BRIEF OVERNIGHT PROGRESS REPORT    SUBJECTIVE: Rn reports that patient is less responsive with mild increased work of breathing and confusion. She is requiring increased oxygenation from 4-6L via   OBJECTIVE:On bedside assessment, she was afebrile with blood pressure 119/54mm Hg and pulse rate 107 beats/min. There were no focal neurological deficits; he was alert but disoriented and lethargic with mild increased work of breathing.   ASSESSMENT & PLAN: 64 y.o female with PMH of chronic diastolic CHF, Morbid obesity, chronic lymphedema, chronic respiratory failure on home 02 at 2L admitted with acute on chronic hypoxic respiratory failure secondary to pneumonia complicated by new onset Afib with RVR.  -Acute Hypoxic Hypercapnic Respiratory Failure secondary to  Pneumonia in a patient with hx of chronic Diastolic CHF and new onset Afib ABG obtained showed  PH 7.25, pCO2 67, pO2 55  -Supplemental O2 as needed to maintain O2 saturations 88 to 92% -BiPAP, wean as tolerated -Follow intermittent ABG and chest x-ray as needed -CXR on 12/1 with mild vascular congestion; mild central pulmonary vascular congestion and bilateral interstitial densities concerning for pulmonary edema -IV Lasix as blood pressure and renal function permits; currently on Lasix on hold -As needed bronchodilators    -Acute Metabolic Encephalopathy due to Hypercapnia -Provide supportive care -BiPAP to treat Hypercapnia         Rufina Falco, DNP, CCRN, FNP-C [facility] Nurse Practitioner Between 7pm to 7am - Pager [phone]

## 2020-10-29 DIAGNOSIS — A419 Sepsis, unspecified organism: Secondary | ICD-10-CM | POA: Diagnosis not present

## 2020-10-29 DIAGNOSIS — I5033 Acute on chronic diastolic (congestive) heart failure: Secondary | ICD-10-CM | POA: Diagnosis not present

## 2020-10-29 DIAGNOSIS — J9601 Acute respiratory failure with hypoxia: Secondary | ICD-10-CM | POA: Diagnosis not present

## 2020-10-29 DIAGNOSIS — N179 Acute kidney failure, unspecified: Secondary | ICD-10-CM | POA: Diagnosis not present

## 2020-10-29 LAB — BASIC METABOLIC PANEL
Anion gap: 12 (ref 5–15)
BUN: 70 mg/dL — ABNORMAL HIGH (ref 8–23)
CO2: 25 mmol/L (ref 22–32)
Calcium: 8.2 mg/dL — ABNORMAL LOW (ref 8.9–10.3)
Chloride: 103 mmol/L (ref 98–111)
Creatinine, Ser: 1.83 mg/dL — ABNORMAL HIGH (ref 0.44–1.00)
GFR, Estimated: 30 mL/min — ABNORMAL LOW (ref >60)
Glucose, Bld: 114 mg/dL — ABNORMAL HIGH (ref 70–99)
Potassium: 4.5 mmol/L (ref 3.5–5.1)
Sodium: 140 mmol/L (ref 135–145)

## 2020-10-29 LAB — MAGNESIUM: Magnesium: 2.3 mg/dL (ref 1.7–2.4)

## 2020-10-29 LAB — PROCALCITONIN: Procalcitonin: 5.35 ng/mL

## 2020-10-29 MED ORDER — BENZOCAINE 10 % MT GEL
Freq: Three times a day (TID) | OROMUCOSAL | Status: DC | PRN
  Filled 2020-10-29: qty 9

## 2020-10-29 NOTE — Progress Notes (Addendum)
Patient refused to wear bipap. Patient is confused as to why it was applied. She states it is cracking her tongue. Patient is confused and claims she was never told she had to wear the bipap. Nurse discussed with patient and husband why the bipap is needed at bedtime. Patient was agreeable at the time to wear it. Patient states it was placed against her will and the hospital is trying to kill her. Tried to reorient patient to treatment. Removed bipap and placed on 6liters nasal cannula.  Requested orajel for tongue from [place],NP. Patient refused.

## 2020-10-29 NOTE — Progress Notes (Signed)
[facility] Encounter Note  Patient: Carmen [name] / Admit Date: 10/24/2020 / Date of Encounter: 10/29/2020, 7:14 AM   Subjective: 12/1 patient overall feels the same as she did yesterday.  She is hungry but has not had any episodes of chest discomfort anginal symptoms or congestive heart failure at this time.  She still does have significant lower extremity edema and lymphedema unchanged from before.  She was placed in the ICU for better control of her heart rate until further ability to treat.  This was treated with continued intravenous amiodarone as well as diltiazem drip.  Overnight heart rate control was about 100 bpm.  The patient does have some lower and blood pressure although this may be due to some volume depletion from IV diuresis.  There is additional concerns of hypoxia although will reevaluate at this time to assess need for changes in diuresis.  Transition to oral medication management will need to ensue  12/2 patient has no significant change in overall condition of acute on chronic systolic dysfunction congestive heart failure with chest x-ray continuing to show some vascular congestion yesterday.  The patient was placed on CPAP machine for which has helped with her oxygenation overnight in which she has had sleep apnea for quite some time.  This is helped overnight quite well.  Her glomerular filtration rate is still significantly abnormal and with continued pulmonary edema will require further treatment.  Atrial fibrillation remains with good heart rate control although no spontaneous conversion to normal sinus rhythm.  The patient has had increased dosages of Cardizem and metoprolol for which has helped with heart rate control and will need to further adjustments today  12/3 patient slept much better overnight last night and breathing much better.  There has been no evidence of significant anginal symptoms.  Patient does continue to require oxygen and has  benefited from BiPAP overnight.  Patient's atrial fibrillation continues to remain in atrial fibrillation with reasonable heart rate control with multiple therapies including amiodarone metoprolol and diltiazem.  Patient is not ambulatory and will have a goal heart rate below 100 bpm.  Currently she is at 105 bpm at rest.  Patient was started on a Lasix drip due to acute kidney injury likely secondary to sepsis pneumonia hypoxia and heart failure.  This is slightly improved and is having reasonable urine output with Lasix drip.  Review of Systems: Positive for: Shortness of breath Negative for: Vision change, hearing change, syncope, dizziness, nausea, vomiting,diarrhea, bloody stool, stomach pain, cough, congestion, diaphoresis, urinary frequency, urinary pain,skin lesions, skin rashes Others previously listed  Objective: Telemetry: Atrial fibrillation with variable heart rate Physical Exam: Blood pressure (!) 102/55, pulse 69, temperature 98.1 F (36.7 C), temperature source Oral, resp. rate 19, height 5\' 1"  (1.549 m), weight (!) 143.8 kg, SpO2 96 %. Body mass index is 59.9 kg/m. General: Well developed, well nourished, in no acute distress. Head: Normocephalic, atraumatic, sclera non-icteric, no xanthomas, nares are without discharge. Neck: No apparent masses Lungs: Normal respirations with few wheezes, some rhonchi, no rales , few crackles   Heart: Irregular rate and rhythm, normal S1 S2, no murmur, no rub, no gallop, PMI is normal size and placement, carotid upstroke normal without bruit, jugular venous pressure normal Abdomen: Soft, non-tender, distended with normoactive bowel sounds. No hepatosplenomegaly. Abdominal aorta is normal size without bruit Extremities: 2+ edema, no clubbing, no cyanosis, positive ulcers,  Peripheral: 2+ radial, 0+ femoral, 0+ dorsal pedal pulses Neuro: Alert and oriented. Moves all  extremities spontaneously. Psych:  Responds to questions appropriately with a  normal affect.   Intake/Output Summary (Last 24 hours) at 10/29/2020 0714 Last data filed at 10/29/2020 0400 Gross per 24 hour  Intake 503.92 ml  Output 1600 ml  Net -1096.08 ml    Inpatient Medications:  . amiodarone  400 mg Oral Daily  . apixaban  5 mg Oral BID  . Chlorhexidine Gluconate Cloth  6 each Topical Daily  . diltiazem  240 mg Oral Daily  . doxycycline  100 mg Oral Q12H  . metoprolol tartrate  12.5 mg Oral BID  . potassium chloride  10 mEq Oral QHS  . sodium chloride flush  3 mL Intravenous Q12H  . traZODone  50 mg Oral QHS   Infusions:  . sodium chloride 250 mL (10/28/20 2007)  . furosemide (LASIX) 200 mg in dextrose 5% 100 mL (2mg /mL) infusion 4 mg/hr (10/28/20 1600)    Labs: Recent Labs    10/28/20 0437 10/29/20 0441  NA 139 140  K 4.9 4.5  CL 104 103  CO2 25 25  GLUCOSE 130* 114*  BUN 66* 70*  CREATININE 2.10* 1.83*  CALCIUM 8.2* 8.2*  MG  --  2.3   Recent Labs    10/27/20 0537  AST 34  ALT 24  ALKPHOS 139*  BILITOT 1.1  PROT 6.5  ALBUMIN 2.1*   Recent Labs    10/27/20 0537 10/28/20 0437  WBC 9.1 9.5  HGB 11.6* 11.7*  HCT 36.7 38.1  MCV 96.1 98.2  PLT 112* 173   No results for input(s): CKTOTAL, CKMB, TROPONINI in the last 72 hours. Invalid input(s): POCBNP No results for input(s): HGBA1C in the last 72 hours.   Weights: Filed Weights   10/27/20 1427 10/28/20 0605 10/29/20 0413  Weight: (!) 141.6 kg (!) 142.1 kg (!) 143.8 kg     Radiology/Studies:  CT Head Wo Contrast  Result Date: 10/24/2020 CLINICAL DATA:  Altered mental status. EXAM: CT HEAD WITHOUT CONTRAST TECHNIQUE: Contiguous axial images were obtained from the base of the skull through the vertex without intravenous contrast. COMPARISON:  None. FINDINGS: Brain: No evidence of acute infarction, hemorrhage, hydrocephalus, extra-axial collection or mass lesion/mass effect. Vascular: No hyperdense vessel or unexpected calcification. Skull: Normal. Negative for fracture or  focal lesion. Sinuses/Orbits: No acute finding. Other: None. IMPRESSION: No acute intracranial pathology. Electronically Signed   By: Virgina Norfolk M.D.   On: 10/24/2020 21:01   DG Chest Port 1 View  Result Date: 10/27/2020 CLINICAL DATA:  Heart failure. EXAM: PORTABLE CHEST 1 VIEW COMPARISON:  October 26, 2020. FINDINGS: Stable cardiomegaly with mild central pulmonary vascular congestion. No pneumothorax or pleural effusion is noted. Stable bilateral interstitial densities are noted concerning for pulmonary edema. Bony thorax is unremarkable. IMPRESSION: Stable cardiomegaly with mild central pulmonary vascular congestion and bilateral interstitial densities concerning for pulmonary edema. Electronically Signed   By: Marijo Conception M.D.   On: 10/27/2020 08:44   DG Chest Port 1 View  Result Date: 10/26/2020 CLINICAL DATA:  Chronic CHF. More to [facility]. Chronic lymphedema. Chronic respiratory failure. EXAM: PORTABLE CHEST 1 VIEW COMPARISON:  10/24/2020.  CT 09/27/2020. FINDINGS: Cardiomegaly with pulmonary venous congestion bilateral interstitial prominence consistent with CHF. Pneumonitis cannot be excluded. A component of underlying chronic interstitial disease cannot be excluded. Given interstitial changes previously identified pulmonary nodules described on CT of 09/27/2020 not well visualized. Follow-up CT can be obtained. No pleural effusion or pneumothorax. IMPRESSION: Cardiomegaly with pulmonary venous congestion and bilateral  interstitial prominence consistent with CHF. Pneumonitis cannot be excluded. A component of underlying chronic interstitial disease cannot be excluded. Electronically Signed   By: Marcello Moores  Register   On: 10/26/2020 12:44   DG Chest Portable 1 View  Result Date: 10/24/2020 CLINICAL DATA:  Shortness of breath EXAM: PORTABLE CHEST 1 VIEW COMPARISON:  Chest radiograph dated 10/05/2020 FINDINGS: The heart is enlarged. Vascular calcifications are seen in the aortic arch. Moderate  patchy bilateral interstitial and airspace opacities appear increased since 10/05/2020. A small left pleural effusion may contribute. There is no right pleural effusion. There is no pneumothorax. The osseous structures are intact. IMPRESSION: Increased moderate bilateral interstitial and airspace opacities may represent pulmonary edema and/or pneumonia. A small left pleural effusion may contribute. Electronically Signed   By: Zerita Boers M.D.   On: 10/24/2020 19:28   DG Chest Port 1 View  Result Date: 10/05/2020 CLINICAL DATA:  Shortness of breath. EXAM: PORTABLE CHEST 1 VIEW COMPARISON:  10/03/2020 FINDINGS: The heart is mildly enlarged but stable. Mild vascular congestion without overt pulmonary edema. No definite infiltrates or effusions. IMPRESSION: Cardiac enlargement and mild vascular congestion without overt pulmonary edema. Electronically Signed   By: Marijo Sanes M.D.   On: 10/05/2020 07:53   DG Chest Port 1 View  Result Date: 10/03/2020 CLINICAL DATA:  Dyspnea, CHF EXAM: PORTABLE CHEST 1 VIEW COMPARISON:  Chest radiograph from one day prior. FINDINGS: Stable cardiomediastinal silhouette with mild cardiomegaly. No pneumothorax. No pleural effusion. Borderline mild pulmonary edema, improved. No consolidative airspace disease. IMPRESSION: Borderline mild congestive heart failure, improved. Electronically Signed   By: Ilona Sorrel M.D.   On: 10/03/2020 15:02   DG Chest Port 1 View  Result Date: 10/02/2020 CLINICAL DATA:  Shortness of breath, acute on chronic diastolic heart failure, morbid obesity, chronic respiratory failure, history of endometrial cancer EXAM: PORTABLE CHEST 1 VIEW COMPARISON:  Portable exam 0651 hours compared to 09/26/2020 FINDINGS: Enlargement of cardiac silhouette with pulmonary vascular congestion. Scattered interstitial infiltrate consistent with mild pulmonary edema and CHF. Atherosclerotic calcification and tortuosity of thoracic aorta. No pleural effusion or  pneumothorax. Osseous structures unremarkable. IMPRESSION: Enlargement of cardiac silhouette with pulmonary vascular congestion and mild pulmonary edema. Electronically Signed   By: Lavonia Dana M.D.   On: 10/02/2020 12:32     Assessment and Recommendation  64 y.o. female with known acute on chronic diastolic dysfunction congestive heart failure severe chronic lymphedema acute kidney injury and new onset acute atrial fibrillation with rapid ventricular rate without evidence of acute coronary syndrome  Atrial fibrillation 1.  Continue amiodarone at 400 mg each day 2.  Addition of oral diltiazem 240 mg CD as well as 25 mg of metoprolol for good heart rate control when changing amiodarone of above 3.  Anticoagulation with Eliquis 5 mg twice per day for further risk reduction in atrial fibrillation stroke now that platelet count has improved  Acute on chronic diastolic dysfunction congestive heart failure 1.  Continuation of Lasix drip at this time for further treatment of acute on chronic diastolic dysfunction congestive heart failure with sepsis acute kidney injury and hypoxia.  Would consider transition to oral torsemide when able 2.  No further cardiac diagnostics necessary at this time 3.  Further compression hose if necessary for lower extremity edema and lymphedema which is chronic  Dr. Clayborn Bigness will be following the patient over the weekend  Signed, Serafina Royals M.D. FACC

## 2020-10-29 NOTE — Progress Notes (Signed)

## 2020-10-29 NOTE — Progress Notes (Signed)
Patient ID: Carmen [name], female   DOB: 1956-03-13, 64 y.o.   MRN: [id number] [facility] PROGRESS NOTE  Carmen [name]:[id number] DOB: Oct 26, 1956 DOA: 10/24/2020 PCP: Cletis Athens, MD  HPI/Subjective: Patient feeling better today.  Breathing better.  Did not tolerate the BiPAP very well again.  Did not sleep very well again last night.  Initially admitted with acute kidney injury and CHF.  Objective: Vitals:   10/29/20 0832 10/29/20 1105  BP: 122/88 (!) 112/58  Pulse: 93 64  Resp: 18 19  Temp: 98.6 F (37 C) 98.5 F (36.9 C)  SpO2: 94% 90%    Intake/Output Summary (Last 24 hours) at 10/29/2020 1319 Last data filed at 10/29/2020 0850 Gross per 24 hour  Intake 503.92 ml  Output 1425 ml  Net -921.08 ml   Filed Weights   10/27/20 1427 10/28/20 0605 10/29/20 0413  Weight: (!) 141.6 kg (!) 142.1 kg (!) 143.8 kg    ROS: Review of Systems  Respiratory: Positive for shortness of breath. Negative for cough.   Cardiovascular: Negative for chest pain.  Gastrointestinal: Negative for abdominal pain, nausea and vomiting.   Exam: Physical Exam HENT:     Head: Normocephalic.     Mouth/Throat:     Pharynx: No oropharyngeal exudate.  Eyes:     General: Lids are normal.     Conjunctiva/sclera: Conjunctivae normal.     Pupils: Pupils are equal, round, and reactive to light.  Cardiovascular:     Rate and Rhythm: Normal rate. Rhythm irregularly irregular.     Heart sounds: Normal heart sounds, S1 normal and S2 normal.  Pulmonary:     Breath sounds: Examination of the right-lower field reveals decreased breath sounds. Examination of the left-lower field reveals decreased breath sounds. Decreased breath sounds present. No wheezing, rhonchi or rales.  Abdominal:     Palpations: Abdomen is soft.     Tenderness: There is no abdominal tenderness.  Musculoskeletal:     Right upper leg: Swelling present.     Left upper leg: Swelling present.     Right lower leg: Swelling  present.     Left lower leg: Swelling present.  Skin:    General: Skin is warm.     Findings: No rash.  Neurological:     Mental Status: She is alert and oriented to person, place, and time.       Data Reviewed: Basic Metabolic Panel: Recent Labs  Lab 10/24/20 1856 10/24/20 1918 10/25/20 0435 10/26/20 0329 10/27/20 0537 10/28/20 0437 10/29/20 0441  NA   < >  --  138 139 139 139 140  K   < >  --  4.1 4.0 4.3 4.9 4.5  CL   < >  --  101 101 102 104 103  CO2   < >  --  29 27 28 25 25   GLUCOSE   < >  --  130* 128* 130* 130* 114*  BUN   < >  --  53* 59* 59* 66* 70*  CREATININE   < >  --  2.42* 2.19* 2.10* 2.10* 1.83*  CALCIUM   < >  --  7.8* 8.0* 7.9* 8.2* 8.2*  MG  --  1.9  --   --   --   --  2.3   < > = values in this interval not displayed.   Liver Function Tests: Recent Labs  Lab 10/24/20 1856 10/26/20 0329 10/27/20 0537  AST 21 22 34  ALT 19 17  24  ALKPHOS 81 102 139*  BILITOT 1.5* 1.5* 1.1  PROT 7.1 6.8 6.5  ALBUMIN 2.8* 2.3* 2.1*   CBC: Recent Labs  Lab 10/24/20 1856 10/26/20 0329 10/27/20 0537 10/28/20 0437  WBC 12.1* 7.2 9.1 9.5  NEUTROABS 11.1*  --   --   --   HGB 11.9* 11.2* 11.6* 11.7*  HCT 37.6 35.1* 36.7 38.1  MCV 95.4 95.6 96.1 98.2  PLT 98* 85* 112* 173   BNP (last 3 results) Recent Labs    09/26/20 2213 10/03/20 0444 10/24/20 1918  BNP 123.2* 73.8 534.9*     Recent Results (from the past 240 hour(s))  Culture, blood (x 2)     Status: None (Preliminary result)   Collection Time: 10/24/20  7:00 PM   Specimen: BLOOD  Result Value Ref Range Status   Specimen Description BLOOD LEFT FA  Final   Special Requests   Final    BOTTLES DRAWN AEROBIC AND ANAEROBIC Blood Culture results may not be optimal due to an excessive volume of blood received in culture bottles   Culture   Final    NO GROWTH 4 DAYS Performed at [facility], [address] [place], [place] [address]    Report Status PENDING  Incomplete  Resp Panel by  RT-PCR (Flu A&B, Covid) Nasopharyngeal Swab     Status: None   Collection Time: 10/24/20  9:05 PM   Specimen: Nasopharyngeal Swab; Nasopharyngeal(NP) swabs in vial transport medium  Result Value Ref Range Status   SARS Coronavirus 2 by RT PCR NEGATIVE NEGATIVE Final    Comment: (NOTE) SARS-CoV-2 target nucleic acids are NOT DETECTED.  The SARS-CoV-2 RNA is generally detectable in upper respiratory specimens during the acute phase of infection. The lowest concentration of SARS-CoV-2 viral copies this assay can detect is 138 copies/mL. A negative result does not preclude SARS-Cov-2 infection and should not be used as the sole basis for treatment or other patient management decisions. A negative result may occur with  improper specimen collection/handling, submission of specimen other than nasopharyngeal swab, presence of viral mutation(s) within the areas targeted by this assay, and inadequate number of viral copies(<138 copies/mL). A negative result must be combined with clinical observations, patient history, and epidemiological information. The expected result is Negative.  Fact Sheet for Patients:  EntrepreneurPulse.com.au  Fact Sheet for Healthcare Providers:  IncredibleEmployment.be  This test is no t yet approved or cleared by the [place] FDA and  has been authorized for detection and/or diagnosis of SARS-CoV-2 by FDA under an Emergency Use Authorization (EUA). This EUA will remain  in effect (meaning this test can be used) for the duration of the COVID-19 declaration under Section 564(b)(1) of the Act, 21 U.S.C.section 360bbb-3(b)(1), unless the authorization is terminated  or revoked sooner.       Influenza A by PCR NEGATIVE NEGATIVE Final   Influenza B by PCR NEGATIVE NEGATIVE Final    Comment: (NOTE) The Xpert Xpress SARS-CoV-2/FLU/RSV plus assay is intended as an aid in the diagnosis of influenza from Nasopharyngeal swab  specimens and should not be used as a sole basis for treatment. Nasal washings and aspirates are unacceptable for Xpert Xpress SARS-CoV-2/FLU/RSV testing.  Fact Sheet for Patients: EntrepreneurPulse.com.au  Fact Sheet for Healthcare Providers: IncredibleEmployment.be  This test is not yet approved or cleared by the [place] FDA and has been authorized for detection and/or diagnosis of SARS-CoV-2 by FDA under an Emergency Use Authorization (EUA). This EUA will remain in effect (meaning  this test can be used) for the duration of the COVID-19 declaration under Section 564(b)(1) of the Act, 21 U.S.C. section 360bbb-3(b)(1), unless the authorization is terminated or revoked.  Performed at [facility], [address] [place], [place] [address]   Culture, group A strep     Status: None   Collection Time: 10/24/20 10:05 PM   Specimen: Throat  Result Value Ref Range Status   Specimen Description   Final    THROAT Performed at [facility], [address] [place], [place] [address]    Special Requests   Final    NONE Performed at [facility], [address], [place], [place] [address]    Culture   Final    NO GROUP A STREP (S.PYOGENES) ISOLATED Performed at [facility], [place] [address], [place], [place] [address]    Report Status 10/27/2020 FINAL  Final  Culture, blood (x 2)     Status: None (Preliminary result)   Collection Time: 10/25/20  8:59 AM   Specimen: BLOOD  Result Value Ref Range Status   Specimen Description BLOOD RIGHT HAND  Final   Special Requests   Final    BOTTLES DRAWN AEROBIC AND ANAEROBIC Blood Culture adequate volume   Culture   Final    NO GROWTH 4 DAYS Performed at [facility], [address], [place], [place] [address]    Report Status PENDING  Incomplete  MRSA PCR Screening     Status: None   Collection Time: 10/25/20  1:26 PM   Specimen: Nasopharyngeal   Result Value Ref Range Status   MRSA by PCR NEGATIVE NEGATIVE Final    Comment:        The GeneXpert MRSA Assay (FDA approved for NASAL specimens only), is one component of a comprehensive MRSA colonization surveillance program. It is not intended to diagnose MRSA infection nor to guide or monitor treatment for MRSA infections. Performed at [facility] [address] [place], [place] [address]   Urine Culture     Status: None   Collection Time: 10/25/20  4:41 PM   Specimen: Urine, Random  Result Value Ref Range Status   Specimen Description   Final    URINE, RANDOM Performed at [facility], [address] [place], [place] [address]    Special Requests   Final    NONE Performed at [facility], [address], [place], [place] [address]    Culture   Final    NO GROWTH Performed at [facility], [place] [address] [place], [place] [address]    Report Status 10/27/2020 FINAL  Final      Scheduled Meds: . amiodarone  400 mg Oral Daily  . apixaban  5 mg Oral BID  . Chlorhexidine Gluconate Cloth  6 each Topical Daily  . diltiazem  240 mg Oral Daily  . doxycycline  100 mg Oral Q12H  . metoprolol tartrate  12.5 mg Oral BID  . potassium chloride  10 mEq Oral QHS  . sodium chloride flush  3 mL Intravenous Q12H  . traZODone  50 mg Oral QHS   Continuous Infusions: . sodium chloride 250 mL (10/28/20 2007)  . furosemide (LASIX) 200 mg in dextrose 5% 100 mL (2mg /mL) infusion 4 mg/hr (10/28/20 1600)    Assessment/Plan:  1. Acute on chronic hypoxic hypercarbic respiratory failure secondary to pneumonia and severe sepsis.  Patient does chronically wear anywhere from 2 to 4 L of oxygen.  This morning on 6 L of oxygen.  Patient had an ABG that showed a pH of 7.25 (less than 7.35, a PCO2 of 67 and a PO2 of 55.  Patient qualifies for noninvasive ventilation at night.  Try to keep nasal cannula around 4 L if possible.  I do not mind oxygen  saturations around 88%. 2. Severe sepsis secondary to pneumonia, present on admission.  Patient also had acute renal failure.  Patient finished Rocephin and on doxycycline.  Procalcitonin very elevated on presentation at 61.  Procalcitonin down to 5.35 today. 3. Acute renal failure.  Patient's baseline creatinine 0.94.  Patient did have a creatinine as high as 2.42 (greater than 1.5 times baseline creatinine within 7 days).  With starting Lasix creatinine down to 1.83 today.  Continue to monitor with diuresis. 4. Acute on chronic diastolic congestive heart failure, anasarca and chronic lymphedema.  Continue Lasix drip today and hopefully switch over to torsemide in the next day or so. 5. Paroxysmal atrial fibrillation.  Oral amiodarone, oral metoprolol and Cardizem.  Eliquis for anticoagulation 6. Weakness.  Physical therapy evaluation.  Patient does not want to go back to rehab and wants to go home. 7. Morbid obesity with a BMI of 59.90 8. Thrombocytopenia.  Platelet last platelet count 173    Code Status:     Code Status Orders  (From admission, onward)         Start     Ordered   10/26/20 1721  Do not attempt resuscitation (DNR)  Continuous       Question Answer Comment  In the event of cardiac or respiratory ARREST Do not call a "code blue"   In the event of cardiac or respiratory ARREST Do not perform Intubation, CPR, defibrillation or ACLS   In the event of cardiac or respiratory ARREST Use medication by any route, position, wound care, and other measures to relive pain and suffering. May use oxygen, suction and manual treatment of airway obstruction as needed for comfort.      10/26/20 1720        Code Status History    Date Active Date Inactive Code Status Order ID Comments User Context   10/24/2020 2124 10/26/2020 1720 Full Code [id number]  Athena Masse, MD ED   09/27/2020 0215 10/08/2020 2252 Full Code [id number]  Athena Masse, MD ED   Advance Care Planning Activity      Family Communication: Left son a message on the phone Disposition Plan: Status is: Inpatient  Dispo: The patient is from: Home              Anticipated d/c is to: Home              Anticipated d/c date is: Potentially 11/01/2020              Patient currently requiring IV diuresis for heart failure.  Not medically stable to go home at this point  Consultants:  Cardiology  Antibiotics:  Doxycycline  Time spent: 27 minutes  [place]

## 2020-10-29 NOTE — Care Management Important Message (Signed)
Important Message  Patient Details  Name: Carmen [name] MRN: [id number] Date of Birth: 09-Apr-1956   Medicare Important Message Given:  Yes     [name] 10/29/2020, 12:01 PM

## 2020-10-30 DIAGNOSIS — G4700 Insomnia, unspecified: Secondary | ICD-10-CM | POA: Diagnosis not present

## 2020-10-30 DIAGNOSIS — J9621 Acute and chronic respiratory failure with hypoxia: Secondary | ICD-10-CM | POA: Diagnosis not present

## 2020-10-30 DIAGNOSIS — I5033 Acute on chronic diastolic (congestive) heart failure: Secondary | ICD-10-CM | POA: Diagnosis not present

## 2020-10-30 DIAGNOSIS — J9622 Acute and chronic respiratory failure with hypercapnia: Secondary | ICD-10-CM

## 2020-10-30 DIAGNOSIS — R410 Disorientation, unspecified: Secondary | ICD-10-CM

## 2020-10-30 DIAGNOSIS — A419 Sepsis, unspecified organism: Secondary | ICD-10-CM | POA: Diagnosis not present

## 2020-10-30 LAB — CULTURE, BLOOD (ROUTINE X 2)
Culture: NO GROWTH
Culture: NO GROWTH
Special Requests: ADEQUATE

## 2020-10-30 LAB — BASIC METABOLIC PANEL
Anion gap: 9 (ref 5–15)
BUN: 56 mg/dL — ABNORMAL HIGH (ref 8–23)
CO2: 34 mmol/L — ABNORMAL HIGH (ref 22–32)
Calcium: 8.5 mg/dL — ABNORMAL LOW (ref 8.9–10.3)
Chloride: 101 mmol/L (ref 98–111)
Creatinine, Ser: 1.24 mg/dL — ABNORMAL HIGH (ref 0.44–1.00)
GFR, Estimated: 48 mL/min — ABNORMAL LOW (ref >60)
Glucose, Bld: 135 mg/dL — ABNORMAL HIGH (ref 70–99)
Potassium: 4.3 mmol/L (ref 3.5–5.1)
Sodium: 144 mmol/L (ref 135–145)

## 2020-10-30 LAB — MAGNESIUM: Magnesium: 2.1 mg/dL (ref 1.7–2.4)

## 2020-10-30 MED ORDER — QUETIAPINE FUMARATE 25 MG PO TABS
25.0000 mg | ORAL_TABLET | Freq: Every day | ORAL | Status: DC
  Administered 2020-10-30 – 2020-10-31 (×2): 25 mg via ORAL
  Filled 2020-10-30 (×2): qty 1

## 2020-10-30 MED ORDER — HALOPERIDOL LACTATE 5 MG/ML IJ SOLN
1.0000 mg | Freq: Four times a day (QID) | INTRAMUSCULAR | Status: DC | PRN
  Filled 2020-10-30: qty 1

## 2020-10-30 NOTE — Progress Notes (Signed)
[facility] Cardiology    SUBJECTIVE: Patient complains of not been able to tolerate the mask still has some shortness of breath denies any pain also would like to have more to eat she still feels fairly anxious denies any tachycardia   Vitals:   10/29/20 2004 10/30/20 0245 10/30/20 0607 10/30/20 0723  BP: 99/62 128/72  (!) 159/89  Pulse: (!) 57 78  91  Resp: 19   20  Temp: 98.5 F (36.9 C) 98.4 F (36.9 C)  98.4 F (36.9 C)  TempSrc: Oral Oral  Oral  SpO2: 92% 95%  94%  Weight:   (!) 141.5 kg   Height:         Intake/Output Summary (Last 24 hours) at 10/30/2020 0908 Last data filed at 10/30/2020 0725 Gross per 24 hour  Intake 47.97 ml  Output 3150 ml  Net -3102.03 ml      PHYSICAL EXAM  General: Well developed, well nourished, in no acute distress HEENT:  Normocephalic and atramatic Neck:  No JVD.  Lungs: Clear bilaterally to auscultation and percussion. Heart: HRRR . Normal S1 and S2 without gallops or murmurs.  Abdomen: Bowel sounds are positive, abdomen soft and non-tender  Msk:  Back normal, normal gait. Normal strength and tone for age. Extremities: No clubbing, cyanosis or edema.   Neuro: Alert and oriented X 3. Psych:  Good affect, responds appropriately   LABS: Basic Metabolic Panel: Recent Labs    10/28/20 0437 10/29/20 0441  NA 139 140  K 4.9 4.5  CL 104 103  CO2 25 25  GLUCOSE 130* 114*  BUN 66* 70*  CREATININE 2.10* 1.83*  CALCIUM 8.2* 8.2*  MG  --  2.3   Liver Function Tests: No results for input(s): AST, ALT, ALKPHOS, BILITOT, PROT, ALBUMIN in the last 72 hours. No results for input(s): LIPASE, AMYLASE in the last 72 hours. CBC: Recent Labs    10/28/20 0437  WBC 9.5  HGB 11.7*  HCT 38.1  MCV 98.2  PLT 173   Cardiac Enzymes: No results for input(s): CKTOTAL, CKMB, CKMBINDEX, TROPONINI in the last 72 hours. BNP: Invalid input(s): POCBNP D-Dimer: No results for input(s): DDIMER in the last 72 hours. Hemoglobin A1C: No results for  input(s): HGBA1C in the last 72 hours. Fasting Lipid Panel: No results for input(s): CHOL, HDL, LDLCALC, TRIG, CHOLHDL, LDLDIRECT in the last 72 hours. Thyroid Function Tests: No results for input(s): TSH, T4TOTAL, T3FREE, THYROIDAB in the last 72 hours.  Invalid input(s): FREET3 Anemia Panel: No results for input(s): VITAMINB12, FOLATE, FERRITIN, TIBC, IRON, RETICCTPCT in the last 72 hours.  No results found.   Echo preserved overall left ventricular function.call with: Number  TELEMETRY: Atrial fibrillation with RVR 150  ASSESSMENT AND PLAN:  Principal Problem:   Acute on chronic diastolic congestive heart failure (HCC) Active Problems:   Acute on chronic respiratory failure with hypoxia (HCC)   Morbid obesity with BMI of 50.0-59.9, adult (HCC)   Elevated troponin   Acute renal failure (HCC)   Chronic acquired lymphedema   Leukocytosis   Thrombocytopenia (HCC)   Anemia   Severe sepsis (HCC)   AF (paroxysmal atrial fibrillation) (HCC)   Acute respiratory failure with hypoxia and hypercapnia (HCC)   Lobar pneumonia (HCC)    Plan Continue supplimental 02 Agree with diuretic therapy forCHF Continue antibx antibiotic therapy for pneumonia Recommend weight loss exercise portion control Agree with therapy for antianxiety Recommend nephrology input for renal insufficiency CPAP BiPAP as needed for respiratory failure Increase activity  recommend ambulation with physical therapy now Morbid obesity would recommend significant weight loss exercise portion control   Yolonda Kida, MD 10/30/2020 9:08 AM

## 2020-10-30 NOTE — Consult Note (Signed)
[facility] Psychiatry Consult   Reason for Consult: Paranoid and hallucinating Referring Physician:  Dr. Leslye Peer  Patient Identification: Carmen [name] MRN:  [id number] Principal Diagnosis: Acute on chronic diastolic congestive heart failure [facility]) Diagnosis:  Principal Problem:   Acute on chronic diastolic congestive heart failure ([place]) Active Problems:   Acute on chronic respiratory failure with hypoxia and hypercapnia ([place])   Morbid obesity with BMI of 50.0-59.9, adult (HCC)   Elevated troponin   Acute renal failure (HCC)   Chronic acquired lymphedema   Leukocytosis   Thrombocytopenia (HCC)   Anemia   Severe sepsis (HCC)   AF (paroxysmal atrial fibrillation) (HCC)   Acute respiratory failure with hypoxia and hypercapnia (HCC)   Lobar pneumonia (HCC)   Insomnia   Total Time spent with patient: 45 min  Subjective:    HPI:  I'm not crazy, they are stealing my stuffs"  Per hospitalist note, Carmen [name] is a 64 y.o. female with medical history significant for chronic diastolic heart failure, morbid obesity, chronic lymphedema, chronic respiratory failure on home O2 at 2 to 4 L, recently hospitalized from 10/31-11/11 with respiratory failure from CHF exacerbation, at which time baseline O2 was increased from 2 L, to 2 to 4 L, who presents to the emergency room from [facility] with increased shortness of breath requiring 5 L by EMS to keep sats at 90, temp of 100.3 with complaint of sore throat and decreased appetite and with reported confusion.  States she had a low-grade fever earlier in the day and developed a dry cough.   Psych consult was requested for psychosis.  The nurses report that the patient is more confused at night, refusing BiPAP.  Patient was seen in her room, her husband was also present in the room.  Patient was lying in her bed.  Patient is alert and oriented x2, patient anxious, reports that staff are stealing her stuff including her lipstick, patient  is aware that she is in the hospital, cannot logically explain why there is still her belongings, patient guarded labile, suspicious, denies any hallucination, denies depression, denies suicidal or homicidal thoughts intent or plan.  Patient and her husband denies any previous psychiatric history.  Paranoia seems to be of acute onset of couple of days. Past Psychiatric History: No known previous psych history  Risk to Self:   Risk to Others:   Prior Inpatient Therapy:   Prior Outpatient Therapy:    Past Medical History:  Past Medical History:  Diagnosis Date  . CHF (congestive heart failure) ([place])   . Endometrial cancer (HCC)    Grade 1  . Hypertension   . Lymphedema   . Morbid obesity ([place])   . Osteoarthritis of both knees     Past Surgical History:  Procedure Laterality Date  . ROBOTIC ASSISTED LAP VAGINAL HYSTERECTOMY  01/10/2011   BSO   Family History: History reviewed. No pertinent family history. Family Psychiatric  History: No Social History:  Social History   Substance and Sexual Activity  Alcohol Use No     Social History   Substance and Sexual Activity  Drug Use No    Social History   Socioeconomic History  . Marital status: Married    Spouse name: Not on file  . Number of children: Not on file  . Years of education: Not on file  . Highest education level: Not on file  Occupational History  . Not on file  Tobacco Use  . Smoking status: Never Smoker  .  Smokeless tobacco: Never Used  Substance and Sexual Activity  . Alcohol use: No  . Drug use: No  . Sexual activity: Never  Other Topics Concern  . Not on file  Social History Narrative  . Not on file   Social Determinants of Health   Financial Resource Strain:   . Difficulty of Paying Living Expenses: Not on file  Food Insecurity:   . Worried About Charity fundraiser in the Last Year: Not on file  . Ran Out of Food in the Last Year: Not on file  Transportation Needs:   . Lack of Transportation  (Medical): Not on file  . Lack of Transportation (Non-Medical): Not on file  Physical Activity:   . Days of Exercise per Week: Not on file  . Minutes of Exercise per Session: Not on file  Stress:   . Feeling of Stress : Not on file  Social Connections:   . Frequency of Communication with Friends and Family: Not on file  . Frequency of Social Gatherings with Friends and Family: Not on file  . Attends Religious Services: Not on file  . Active Member of Clubs or Organizations: Not on file  . Attends Archivist Meetings: Not on file  . Marital Status: Not on file   Additional Social History:    Allergies:   Allergies  Allergen Reactions  . Clarithromycin     REACTION: respiratory issues, can't breathe  . Penicillins     REACTION: can't breath    Labs:  Results for orders placed or performed during the hospital encounter of 10/24/20 (from the past 48 hour(s))  Procalcitonin     Status: None   Collection Time: 10/29/20  4:41 AM  Result Value Ref Range   Procalcitonin 5.35 ng/mL    Comment:        Interpretation: PCT > 2 ng/mL: Systemic infection (sepsis) is likely, unless other causes are known. (NOTE)       Sepsis PCT Algorithm           Lower Respiratory Tract                                      Infection PCT Algorithm    ----------------------------     ----------------------------         PCT < 0.25 ng/mL                PCT < 0.10 ng/mL          Strongly encourage             Strongly discourage   discontinuation of antibiotics    initiation of antibiotics    ----------------------------     -----------------------------       PCT 0.25 - 0.50 ng/mL            PCT 0.10 - 0.25 ng/mL               OR       >80% decrease in PCT            Discourage initiation of                                            antibiotics      Encourage discontinuation  of antibiotics    ----------------------------     -----------------------------         PCT >= 0.50  ng/mL              PCT 0.26 - 0.50 ng/mL               AND       <80% decrease in PCT              Encourage initiation of                                             antibiotics       Encourage continuation           of antibiotics    ----------------------------     -----------------------------        PCT >= 0.50 ng/mL                  PCT > 0.50 ng/mL               AND         increase in PCT                  Strongly encourage                                      initiation of antibiotics    Strongly encourage escalation           of antibiotics                                     -----------------------------                                           PCT <= 0.25 ng/mL                                                 OR                                        > 80% decrease in PCT                                      Discontinue / Do not initiate                                             antibiotics  Performed at [facility], [address] [place], [place] [address]   Basic metabolic panel     Status: Abnormal   Collection Time: 10/29/20  4:41 AM  Result Value Ref Range   Sodium 140 135 - 145 mmol/L  Potassium 4.5 3.5 - 5.1 mmol/L   Chloride 103 98 - 111 mmol/L   CO2 25 22 - 32 mmol/L   Glucose, Bld 114 (H) 70 - 99 mg/dL    Comment: Glucose reference range applies only to samples taken after fasting for at least 8 hours.   BUN 70 (H) 8 - 23 mg/dL   Creatinine, Ser 1.83 (H) 0.44 - 1.00 mg/dL   Calcium 8.2 (L) 8.9 - 10.3 mg/dL   GFR, Estimated 30 (L) >60 mL/min    Comment: (NOTE) Calculated using the CKD-EPI Creatinine Equation (2021)    Anion gap 12 5 - 15    Comment: Performed at [facility], [address] [place], [place] [address]  Magnesium     Status: None   Collection Time: 10/29/20  4:41 AM  Result Value Ref Range   Magnesium 2.3 1.7 - 2.4 mg/dL    Comment: Performed at [facility], [address], [place], [place]  [address]  Basic metabolic panel     Status: Abnormal   Collection Time: 10/30/20  2:42 PM  Result Value Ref Range   Sodium 144 135 - 145 mmol/L   Potassium 4.3 3.5 - 5.1 mmol/L   Chloride 101 98 - 111 mmol/L   CO2 34 (H) 22 - 32 mmol/L   Glucose, Bld 135 (H) 70 - 99 mg/dL    Comment: Glucose reference range applies only to samples taken after fasting for at least 8 hours.   BUN 56 (H) 8 - 23 mg/dL   Creatinine, Ser 1.24 (H) 0.44 - 1.00 mg/dL   Calcium 8.5 (L) 8.9 - 10.3 mg/dL   GFR, Estimated 48 (L) >60 mL/min    Comment: (NOTE) Calculated using the CKD-EPI Creatinine Equation (2021)    Anion gap 9 5 - 15    Comment: Performed at [facility], [address] [place], [place] [address]  Magnesium     Status: None   Collection Time: 10/30/20  2:42 PM  Result Value Ref Range   Magnesium 2.1 1.7 - 2.4 mg/dL    Comment: Performed at [facility], [address] [place], [place] [address]    Current Facility-Administered Medications  Medication Dose Route Frequency Provider Last Rate Last Admin  . 0.9 %  sodium chloride infusion  250 mL Intravenous PRN Donne Hazel, MD 10 mL/hr at 10/28/20 2007 250 mL at 10/28/20 2007  . acetaminophen (TYLENOL) tablet 650 mg  650 mg Oral Q4H PRN Donne Hazel, MD   650 mg at 10/28/20 1945  . amiodarone (PACERONE) tablet 400 mg  400 mg Oral Daily Corey Skains, MD   400 mg at 10/30/20 0943  . apixaban (ELIQUIS) tablet 5 mg  5 mg Oral BID Loletha Grayer, MD   5 mg at 10/30/20 0943  . benzocaine (ORAJEL) 10 % mucosal gel   Mouth/Throat TID PRN Lang Snow, NP      . Chlorhexidine Gluconate Cloth 2 % PADS 6 each  6 each Topical Daily Donne Hazel, MD   6 each at 10/30/20 [phone]  . diltiazem (CARDIZEM CD) 24 hr capsule 240 mg  240 mg Oral Daily Corey Skains, MD   240 mg at 10/30/20 0943  . doxycycline (VIBRA-TABS) tablet 100 mg  100 mg Oral Q12H Oswald Hillock, RPH   100 mg at 10/30/20 0943  . furosemide  (LASIX) 200 mg in dextrose 5 % 100 mL (2 mg/mL) infusion  4 mg/hr  Intravenous Continuous Corey Skains, MD 2 mL/hr at 10/30/20 0725 4 mg/hr at 10/30/20 0725  . haloperidol lactate (HALDOL) injection 1 mg  1 mg Intravenous Q6H PRN Loletha Grayer, MD      . metoprolol tartrate (LOPRESSOR) tablet 12.5 mg  12.5 mg Oral BID Loletha Grayer, MD   12.5 mg at 10/30/20 0942  . morphine 2 MG/ML injection 2 mg  2 mg Intravenous Q4H PRN Donne Hazel, MD   2 mg at 10/27/20 0249  . potassium chloride SA (KLOR-CON) CR tablet 10 mEq  10 mEq Oral QHS Donne Hazel, MD   10 mEq at 10/29/20 2156  . QUEtiapine (SEROQUEL) tablet 25 mg  25 mg Oral QHS Wieting, Richard, MD      . sodium chloride flush (NS) 0.9 % injection 3 mL  3 mL Intravenous Q12H Donne Hazel, MD   3 mL at 10/30/20 0944  . sodium chloride flush (NS) 0.9 % injection 3 mL  3 mL Intravenous PRN Donne Hazel, MD        Musculoskeletal: Strength & Muscle Tone: within normal limits Gait & Station: Lying in bed Patient leans:   Psychiatric Specialty Exam: Physical Exam HENT:     Head: Normocephalic and atraumatic.     Mouth/Throat:     Mouth: Mucous membranes are moist.  Eyes:     Extraocular Movements: Extraocular movements intact.  Musculoskeletal:     Cervical back: Normal range of motion.  Skin:    General: Skin is dry.  Neurological:     Mental Status: She is alert.     Review of Systems  Blood pressure 135/70, pulse (!) 58, temperature 98.1 F (36.7 C), temperature source Oral, resp. rate 19, height 5\' 1"  (1.549 m), weight (!) 141.5 kg, SpO2 92 %.Body mass index is 58.95 kg/m.  General Appearance: Disheveled  Eye Contact:  Poor  Speech:  Slow  Volume:  Normal  Mood:  Anxious  Affect:  Labile  Thought Process:  Disorganized  Orientation:  ox2  Thought Content: Paranoid delusional  Suicidal Thoughts:  No  Homicidal Thoughts:  No  Memory: Impaired ST  memory  Judgement:  Poor  Insight:  Lacking  Psychomotor  Activity:  Normal  Concentration: Poor  Recall:  [place] of Knowledge:  Good  Language:  Good  Akathisia:    Handed:    AIMS (if indicated):     Assets:  Social Support  ADL's:    Cognition: Poor concentration  Sleep:        Treatment Plan Summary: Daily contact with patient to assess and evaluate symptoms and progress in treatment and Medication management Plan- Patient with no known previous psychiatric history, presenting with acute onset of confusion, paranoid delusional, sundowning; presentation consistent with delirium.  Plan- Ensure safety of the patient due to fluctuating confusion. Recommend Haldol 2 mg po or 1 mg im/iv q 6 hr prn  for agitation and psychosis,  Melatonin 3 mg 2 hours before sleep for sleep, may continue Seroquel if melatonin does not help.   Disposition: Patient does not meet criteria for psychiatric inpatient admission.  Lenward Chancellor, MD 10/30/2020 3:11 PM
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
## 2020-10-30 NOTE — Progress Notes (Signed)
Patient resting. Oxygen in use. Family member has refused cpap. States to let patient rest.

## 2020-10-31 DIAGNOSIS — I5033 Acute on chronic diastolic (congestive) heart failure: Secondary | ICD-10-CM | POA: Diagnosis not present

## 2020-10-31 MED ORDER — METOPROLOL TARTRATE 25 MG PO TABS
25.0000 mg | ORAL_TABLET | Freq: Two times a day (BID) | ORAL | Status: DC
  Administered 2020-10-31 – 2020-11-01 (×2): 25 mg via ORAL
  Filled 2020-10-31 (×2): qty 1
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
## 2020-10-31 NOTE — Progress Notes (Signed)
[facility] Cardiology    SUBJECTIVE: Significant respiratory failure short of breath lying in bed resting comfortably no pain denies palpitations or tachycardia.   Vitals:   10/31/20 0356 10/31/20 0400 10/31/20 0424 10/31/20 0751  BP: 118/68   125/69  Pulse: 62   62  Resp: 20 20  16   Temp: 97.9 F (36.6 C)   98.3 F (36.8 C)  TempSrc: Oral   Oral  SpO2: 94%   93%  Weight:   135.8 kg   Height:         Intake/Output Summary (Last 24 hours) at 10/31/2020 1035 Last data filed at 10/31/2020 0600 Gross per 24 hour  Intake 400 ml  Output 3850 ml  Net -3450 ml      PHYSICAL EXAM  General: Well developed, well nourished, in no acute distress HEENT:  Normocephalic and atramatic Neck:  No JVD.  Lungs: Clear bilaterally to auscultation and percussion. Heart: Irregular irregular. Normal S1 and S2 without gallops or murmurs.  Abdomen: Bowel sounds are positive, abdomen soft and non-tender  Msk:  Back normal, normal gait. Normal strength and tone for age. Extremities: No clubbing, cyanosis or 2+edema.   Neuro: Alert and oriented X 3. Psych:  Good affect, responds appropriately   LABS: Basic Metabolic Panel: Recent Labs    10/29/20 0441 10/30/20 1442  NA 140 144  K 4.5 4.3  CL 103 101  CO2 25 34*  GLUCOSE 114* 135*  BUN 70* 56*  CREATININE 1.83* 1.24*  CALCIUM 8.2* 8.5*  MG 2.3 2.1   Liver Function Tests: No results for input(s): AST, ALT, ALKPHOS, BILITOT, PROT, ALBUMIN in the last 72 hours. No results for input(s): LIPASE, AMYLASE in the last 72 hours. CBC: No results for input(s): WBC, NEUTROABS, HGB, HCT, MCV, PLT in the last 72 hours. Cardiac Enzymes: No results for input(s): CKTOTAL, CKMB, CKMBINDEX, TROPONINI in the last 72 hours. BNP: Invalid input(s): POCBNP D-Dimer: No results for input(s): DDIMER in the last 72 hours. Hemoglobin A1C: No results for input(s): HGBA1C in the last 72 hours. Fasting Lipid Panel: No results for input(s): CHOL, HDL, LDLCALC,  TRIG, CHOLHDL, LDLDIRECT in the last 72 hours. Thyroid Function Tests: No results for input(s): TSH, T4TOTAL, T3FREE, THYROIDAB in the last 72 hours.  Invalid input(s): FREET3 Anemia Panel: No results for input(s): VITAMINB12, FOLATE, FERRITIN, TIBC, IRON, RETICCTPCT in the last 72 hours.  No results found.   Echo preserved left ventricular function EF of at least 55%  TELEMETRY: Atrial fibrillation rate between 110 and 130  ASSESSMENT AND PLAN:  Principal Problem:   Acute on chronic diastolic congestive heart failure (HCC) Active Problems:   Acute on chronic respiratory failure with hypoxia and hypercapnia (HCC)   Morbid obesity with BMI of 50.0-59.9, adult (HCC)   Elevated troponin   Acute renal failure (HCC)   Chronic acquired lymphedema   Leukocytosis   Thrombocytopenia (HCC)   Anemia   Severe sepsis (HCC)   AF (paroxysmal atrial fibrillation) (HCC)   Acute respiratory failure with hypoxia and hypercapnia (HCC)   Lobar pneumonia (HCC)   Insomnia   Confusion   Acute delirium    Plan Continue supplemental oxygen and inhalers for respiratory failure Agree with diuretic therapy for leg edema heart failure DVT prophylaxis Recommend significant weight loss for morbid obesity Rate control and anticoagulation for atrial fibrillation increase metoprolol Agree with antibiotic therapy for pneumonia Severe anxiety consider psychiatric input   Yolonda Kida, MD 10/31/2020 10:35 AM

## 2020-10-31 NOTE — Progress Notes (Signed)
Notified by Lab that patient is refusing AM lab work.

## 2020-10-31 NOTE — Progress Notes (Signed)
[facility] [place] NAME: Stesha Neyens    MR#:  [id number]  DATE OF BIRTH:  1955/12/14  SUBJECTIVE:  patient very upset about lab draws and does not want IV Haldol. Husband at bedside. Slept well last night. Husband was allowed in the room. Less paranoid today. Not happy about participating with physical therapy. Wants to go home.   REVIEW OF SYSTEMS:   Review of Systems  Constitutional: Negative for chills, fever and weight loss.  HENT: Negative for ear discharge, ear pain and nosebleeds.   Eyes: Negative for blurred vision, pain and discharge.  Respiratory: Positive for shortness of breath. Negative for sputum production, wheezing and stridor.   Cardiovascular: Negative for chest pain, palpitations, orthopnea and PND.  Gastrointestinal: Negative for abdominal pain, diarrhea, nausea and vomiting.  Genitourinary: Negative for frequency and urgency.  Musculoskeletal: Positive for back pain and joint pain.  Neurological: Positive for weakness. Negative for sensory change, speech change and focal weakness.  Psychiatric/Behavioral: Negative for depression and hallucinations. The patient is not nervous/anxious.    Tolerating Diet:yes Tolerating PT: recommends rehab  DRUG ALLERGIES:   Allergies  Allergen Reactions  . Clarithromycin     REACTION: respiratory issues, can't breathe  . Penicillins     REACTION: can't breath    VITALS:  Blood pressure 125/69, pulse 62, temperature 98.3 F (36.8 C), temperature source Oral, resp. rate 16, height 5\' 1"  (1.549 m), weight 135.8 kg, SpO2 93 %.  PHYSICAL EXAMINATION:   Physical Exam  GENERAL:  64 y.o.-year-old patient lying in the bed with no acute distress.  Morbid obesity HEENT: Head atraumatic, normocephalic. Oropharynx and nasopharynx clear.  NECK:  Supple, no jugular venous distention. No thyroid enlargement, no tenderness.  LUNGS:decreased  breath sounds bilaterally, no wheezing,  rales, rhonchi. No use of accessory muscles of respiration.  CARDIOVASCULAR: S1, S2 normal. No murmurs, rubs, or gallops.  ABDOMEN: Soft, nontender, nondistended. Bowel sounds present. No organomegaly or mass.  EXTREMITIES  Chronic Bilateral LE edema NEUROLOGIC: Cranial nerves II through XII are intact. No focal Motor or sensory deficits b/l.  Debility+ PSYCHIATRIC:  patient is alert and oriented x 3. Anxious+ SKIN: No obvious rash, lesion, or ulcer.   LABORATORY PANEL:  CBC Recent Labs  Lab 10/28/20 0437  WBC 9.5  HGB 11.7*  HCT 38.1  PLT 173    Chemistries  Recent Labs  Lab 10/27/20 0537 10/28/20 0437 10/30/20 1442  NA 139   < > 144  K 4.3   < > 4.3  CL 102   < > 101  CO2 28   < > 34*  GLUCOSE 130*   < > 135*  BUN 59*   < > 56*  CREATININE 2.10*   < > 1.24*  CALCIUM 7.9*   < > 8.5*  MG  --    < > 2.1  AST 34  --   --   ALT 24  --   --   ALKPHOS 139*  --   --   BILITOT 1.1  --   --    < > = values in this interval not displayed.   Cardiac Enzymes No results for input(s): TROPONINI in the last 168 hours. RADIOLOGY:  No results found. ASSESSMENT AND PLAN:  64 y.o.femalewith medical history significant forchronic diastolic heart failure, morbid obesity, chronic lymphedema, chronic respiratory failure on home O2 at 2 to 4 L, recently hospitalized from 10/31-11/11 with respiratory failure from CHF exacerbation,  at which time baseline O2 was increased from 2 L,to 2 to 4 L, who presents to the emergency room from [facility] with increased shortness of breath   Acute on chronic hypoxic hypercarbic respiratory failure secondary to pneumonia and severe sepsis.  Patient also has CHF.  --sepsis resolved --Patient chronically wears oxygen anywhere from 2 to 4 L.   --Continue to keep oxygen and as low as possible around 4 L.  -- Patient did have an ABG that showed a pH of 7.25 (less than 7.35), PCO2 of 67 (above 55), PO2 of 55.  -- Patient qualifies for noninvasive  ventilation at night.  Does not tolerate the BiPAP very well so hopefully will tolerate trilogy better.  Insomnia and paranoid thoughts. --psychiatry consultation appreciated -- patient is refused to get IV Haldol  -- continue seroquel at night.    Severe sepsis secondary to pneumonia, present on admission patient also had acute renal failure.   --Patient finished Rocephin and will finish up doxycycline. --  Procalcitonin elevated at 61 and down to 5.35 as of yesterday.  Acute on chronic diastolic congestive heart failure with anasarca and chronic lymphedema.  -- On Lasix drip since 10/28/20--started by Dr Cammie Sickle --Total UOP 12000 cc   Acute renal failure.  Patient's creatinine up as high as 2.42 which is greater than 1.5 times baseline creatinine within 7 days.   -No labs drawn from this morning but yesterday's creatinine 1.24. --creatinine improving --pt refusing lab drwa  Paroxysmal atrial fibrillation  --on oral amiodarone oral metoprolol and Cardizem.   --Continue Eliquis for anticoagulation  Weakness.  Continue physical therapy evaluation.  Patient does not want to go back to rehab.  Thrombocytopenia.  Platelet count now up to 173  Morbid obesity with a BMI of 58.95   Procedures:none Family communication :Husband at bedside Consults :Psych, cardiology CODE STATUS: DNR DVT Prophylaxis :eliquis  Status is: Inpatient  Remains inpatient appropriate because:IV treatments appropriate due to intensity of illness or inability to take PO   Dispo: The patient is from: SNF              Anticipated d/c is to: TBD              Anticipated d/c date is: 2 days              Patient currently is not medically stable to d/c. Still on IV lasix gtt       TOTAL TIME TAKING CARE OF THIS PATIENT: 25 minutes.  >50% time spent on counselling and coordination of care  Note: This dictation was prepared with Dragon dictation along with smaller phrase technology. Any transcriptional  errors that result from this process are unintentional.  Fritzi Mandes M.D    [facility]   CC: Primary care physician; Cletis Athens, MDPatient ID: Kathe Becton, female   DOB: Apr 15, 1956, 64 y.o.   MRN: [id number]

## 2020-11-01 ENCOUNTER — Emergency Department
Admission: EM | Admit: 2020-11-01 | Discharge: 2020-11-05 | Disposition: A | Discharge status: Home / Self Care | Payer: Medicare HMO | Attending: Emergency Medicine | Admitting: Emergency Medicine

## 2020-11-01 ENCOUNTER — Other Ambulatory Visit: Payer: Self-pay

## 2020-11-01 DIAGNOSIS — I110 Hypertensive heart disease with heart failure: Secondary | ICD-10-CM | POA: Insufficient documentation

## 2020-11-01 DIAGNOSIS — I4891 Unspecified atrial fibrillation: Secondary | ICD-10-CM | POA: Insufficient documentation

## 2020-11-01 DIAGNOSIS — Z7901 Long term (current) use of anticoagulants: Secondary | ICD-10-CM | POA: Insufficient documentation

## 2020-11-01 DIAGNOSIS — Z79899 Other long term (current) drug therapy: Secondary | ICD-10-CM | POA: Insufficient documentation

## 2020-11-01 DIAGNOSIS — I509 Heart failure, unspecified: Secondary | ICD-10-CM | POA: Diagnosis not present

## 2020-11-01 DIAGNOSIS — R5381 Other malaise: Secondary | ICD-10-CM | POA: Diagnosis not present

## 2020-11-01 DIAGNOSIS — Z8542 Personal history of malignant neoplasm of other parts of uterus: Secondary | ICD-10-CM | POA: Insufficient documentation

## 2020-11-01 DIAGNOSIS — M6281 Muscle weakness (generalized): Secondary | ICD-10-CM | POA: Insufficient documentation

## 2020-11-01 DIAGNOSIS — R2689 Other abnormalities of gait and mobility: Secondary | ICD-10-CM | POA: Insufficient documentation

## 2020-11-01 DIAGNOSIS — Z20822 Contact with and (suspected) exposure to covid-19: Secondary | ICD-10-CM | POA: Diagnosis not present

## 2020-11-01 DIAGNOSIS — R531 Weakness: Secondary | ICD-10-CM

## 2020-11-01 DIAGNOSIS — I5033 Acute on chronic diastolic (congestive) heart failure: Secondary | ICD-10-CM | POA: Diagnosis not present

## 2020-11-01 LAB — CREATININE, SERUM
Creatinine, Ser: 1.25 mg/dL — ABNORMAL HIGH (ref 0.44–1.00)
GFR, Estimated: 48 mL/min — ABNORMAL LOW (ref >60)

## 2020-11-01 LAB — CBC
HCT: 39.4 % (ref 36.0–46.0)
Hemoglobin: 12.5 g/dL (ref 12.0–15.0)
MCH: 30.3 pg (ref 26.0–34.0)
MCHC: 31.7 g/dL (ref 30.0–36.0)
MCV: 95.6 fL (ref 80.0–100.0)
Platelets: 387 10*3/uL (ref 150–400)
RBC: 4.12 MIL/uL (ref 3.87–5.11)
RDW: 15.5 % (ref 11.5–15.5)
WBC: 18.8 10*3/uL — ABNORMAL HIGH (ref 4.0–10.5)
nRBC: 0 % (ref 0.0–0.2)

## 2020-11-01 MED ORDER — APIXABAN 5 MG PO TABS
5.0000 mg | ORAL_TABLET | Freq: Two times a day (BID) | ORAL | Status: DC
  Administered 2020-11-02 – 2020-11-05 (×7): 5 mg via ORAL
  Filled 2020-11-01 (×7): qty 1

## 2020-11-01 MED ORDER — FUROSEMIDE 40 MG PO TABS: 40.0000 mg | ORAL_TABLET | Freq: Two times a day (BID) | ORAL | Status: DC

## 2020-11-01 MED ORDER — METOPROLOL TARTRATE 25 MG PO TABS
25.0000 mg | ORAL_TABLET | Freq: Two times a day (BID) | ORAL | 1 refills | Status: DC
Start: 2020-11-01 — End: 2021-01-26

## 2020-11-01 MED ORDER — ENALAPRIL MALEATE 5 MG PO TABS
5.0000 mg | ORAL_TABLET | Freq: Every day | ORAL | 1 refills | Status: DC
Start: 2020-11-01 — End: 2020-12-14

## 2020-11-01 MED ORDER — ENALAPRIL MALEATE 10 MG PO TABS
5.0000 mg | ORAL_TABLET | Freq: Every day | ORAL | Status: DC
  Administered 2020-11-01 – 2020-11-05 (×5): 5 mg via ORAL
  Filled 2020-11-01 (×5): qty 1

## 2020-11-01 MED ORDER — AMIODARONE HCL 400 MG PO TABS
400.0000 mg | ORAL_TABLET | Freq: Every day | ORAL | 1 refills | Status: DC
Start: 2020-11-02 — End: 2021-01-26

## 2020-11-01 MED ORDER — QUETIAPINE FUMARATE 25 MG PO TABS
25.0000 mg | ORAL_TABLET | Freq: Every day | ORAL | Status: DC
  Administered 2020-11-01 – 2020-11-04 (×4): 25 mg via ORAL
  Filled 2020-11-01 (×3): qty 1

## 2020-11-01 MED ORDER — QUETIAPINE FUMARATE 25 MG PO TABS
25.0000 mg | ORAL_TABLET | Freq: Every day | ORAL | 0 refills | Status: DC
Start: 2020-11-01 — End: 2021-01-27

## 2020-11-01 MED ORDER — METOPROLOL TARTRATE 25 MG PO TABS
25.0000 mg | ORAL_TABLET | Freq: Two times a day (BID) | ORAL | Status: DC
  Administered 2020-11-01 – 2020-11-05 (×6): 25 mg via ORAL
  Filled 2020-11-01 (×7): qty 1

## 2020-11-01 MED ORDER — LORATADINE 10 MG PO TABS
10.0000 mg | ORAL_TABLET | Freq: Every day | ORAL | Status: DC
  Administered 2020-11-01 – 2020-11-05 (×5): 10 mg via ORAL
  Filled 2020-11-01 (×5): qty 1

## 2020-11-01 MED ORDER — APIXABAN 5 MG PO TABS
5.0000 mg | ORAL_TABLET | Freq: Two times a day (BID) | ORAL | 3 refills | Status: DC
Start: 2020-11-01 — End: 2022-04-26

## 2020-11-01 MED ORDER — AMIODARONE HCL 200 MG PO TABS
400.0000 mg | ORAL_TABLET | Freq: Every day | ORAL | Status: DC
  Administered 2020-11-02 – 2020-11-05 (×4): 400 mg via ORAL
  Filled 2020-11-01 (×5): qty 2

## 2020-11-01 MED ORDER — DILTIAZEM HCL ER COATED BEADS 240 MG PO CP24
240.0000 mg | ORAL_CAPSULE | Freq: Every day | ORAL | 1 refills | Status: DC
Start: 2020-11-02 — End: 2020-12-14

## 2020-11-01 MED ORDER — FUROSEMIDE 40 MG PO TABS
40.0000 mg | ORAL_TABLET | Freq: Two times a day (BID) | ORAL | Status: DC
  Administered 2020-11-02 – 2020-11-05 (×7): 40 mg via ORAL
  Filled 2020-11-01 (×7): qty 1

## 2020-11-01 MED ORDER — DILTIAZEM HCL ER COATED BEADS 240 MG PO CP24
240.0000 mg | ORAL_CAPSULE | Freq: Every day | ORAL | Status: DC
  Administered 2020-11-02 – 2020-11-05 (×4): 240 mg via ORAL
  Filled 2020-11-01 (×4): qty 1

## 2020-11-01 MED ORDER — POTASSIUM CHLORIDE CRYS ER 20 MEQ PO TBCR
10.0000 meq | EXTENDED_RELEASE_TABLET | Freq: Every day | ORAL | Status: DC
  Administered 2020-11-01 – 2020-11-04 (×4): 10 meq via ORAL
  Filled 2020-11-01 (×3): qty 1

## 2020-11-01 NOTE — TOC Progression Note (Signed)
Transition of Care [facility]) - Progression Note    Patient Details  Name: Carmen [name] MRN: [id number] Date of Birth: 1956-11-12  Transition of Care [facility]) CM/SW [place], RN Phone Number: 11/01/2020, 12:06 PM  Clinical Narrative:   [facility] arranged [place] and NIV, barriers resolved at this time. Patient to be discharged to home per Attending.    Expected Discharge Plan: [place] Barriers to Discharge: Barriers Resolved  Expected Discharge Plan and Services Expected Discharge Plan: [place] In-house Referral: Clinical Social Work   Post Acute Care Choice: [place] Living arrangements for the past 2 months: [place] Oceanographer)                 DME Arranged: NIV DME Agency: [employer] Date DME Agency Contacted: 11/01/20 Time DME Agency Contacted: 1203 Representative spoke with at DME Agency: Andree Coss [phone] [place] Arranged: RN, PT, OT, Nurse's Aide [place] Agency: [facility] Date [place]: 11/01/20 Time [place]: 1205 Representative spoke with at [place]: [place] [phone]   Social Determinants of Health ([place]) Interventions    Readmission Risk Interventions Readmission Risk Prevention Plan 10/28/2020  Transportation Screening Complete  PCP or Specialist Appt within 3-5 Days Not Complete  Not Complete comments Pending discharge, will be scheduled by unit secretary.  [place] or Home Care Consult Complete  Social Work Consult for [place] Planning/Counseling Complete  Palliative Care Screening Not Applicable  Medication Review Press photographer) Complete  Some recent data might be hidden

## 2020-11-01 NOTE — Progress Notes (Signed)
[facility] Cardiology  Patient Description: Carmen [name] is a 64 y/o female with PMH significant for chronic diastolic HF, HTN,  chronic respiratory failure (on supplemental O2), chronic lymphedema and morbid obesity admitted for acute on chronic diastolic CHF and acute on chronic respiratory failure with hypoxia. Cardiology consulted for new onset atrial fibrillation with RVR.    SUBJECTIVE: The patient reports to be doing well on today and states that she is ready for discharge. The patient denies having any worsening dyspnea and she denies all chest pain, syncope or palpitations. She denies having any complications with the amiodarone therapy at this time.   OBJECTIVE: the patient is resting comfortably in bed and appears well on today. Bedside ECG reveals Atrial fibrillation with a HR of 90-110 bpm and the patient remains asymptomatic. The patient's vss and she is in no apparent distress.    Vitals:   10/31/20 2016 10/31/20 2140 11/01/20 0416 11/01/20 0806  BP: 137/80 (!) 148/114 132/68 139/78  Pulse: 73 (!) 105 90 73  Resp:   18 20  Temp: 98.3 F (36.8 C)  98 F (36.7 C) 98 F (36.7 C)  TempSrc: Oral  Oral Oral  SpO2: 93%  94% (!) 88%  Weight:   132.8 kg   Height:         Intake/Output Summary (Last 24 hours) at 11/01/2020 1257 Last data filed at 11/01/2020 1008 Gross per 24 hour  Intake 998.7 ml  Output 3725 ml  Net -2726.3 ml      PHYSICAL EXAM  General: Well developed, well nourished, in no acute distress HEENT:  Normocephalic and atraumatic. PERRL Neck:  No JVD.  Lungs: Clear bilaterally to auscultation. Increased effort of breathing. Chest expansion appears symmetrical.  Heart: irregular RR . Normal S1 and S2 without gallops or murmurs.  Abdomen: Bowel sounds are positive, abdomen soft, obese and non-tender  Msk: Normal strength and tone for age. Extremities: severe nonpitting BLE edema and discoloration. (chronic) No clubbing or cyanosis Neuro: Alert and oriented X  3. Psych:  Good affect, responds appropriately   LABS: Basic Metabolic Panel: Recent Labs    10/30/20 1442  NA 144  K 4.3  CL 101  CO2 34*  GLUCOSE 135*  BUN 56*  CREATININE 1.24*  CALCIUM 8.5*  MG 2.1   Liver Function Tests: No results for input(s): AST, ALT, ALKPHOS, BILITOT, PROT, ALBUMIN in the last 72 hours. No results for input(s): LIPASE, AMYLASE in the last 72 hours. CBC: No results for input(s): WBC, NEUTROABS, HGB, HCT, MCV, PLT in the last 72 hours. Cardiac Enzymes: No results for input(s): CKTOTAL, CKMB, CKMBINDEX, TROPONINI in the last 72 hours. BNP: Invalid input(s): POCBNP D-Dimer: No results for input(s): DDIMER in the last 72 hours. Hemoglobin A1C: No results for input(s): HGBA1C in the last 72 hours. Fasting Lipid Panel: No results for input(s): CHOL, HDL, LDLCALC, TRIG, CHOLHDL, LDLDIRECT in the last 72 hours. Thyroid Function Tests: No results for input(s): TSH, T4TOTAL, T3FREE, THYROIDAB in the last 72 hours.  Invalid input(s): FREET3 Anemia Panel: No results for input(s): VITAMINB12, FOLATE, FERRITIN, TIBC, IRON, RETICCTPCT in the last 72 hours.  No results found.   Echo: IMPRESSIONS  1. Left ventricular ejection fraction, by estimation, is 55 to 60%. The  left ventricle has normal function. Left ventricular endocardial border  not optimally defined to evaluate regional wall motion. Left ventricular  diastolic function could not be  evaluated.  2. Right ventricular systolic function is normal. The right ventricular  size  is normal.  3. Left atrial size was moderately dilated.  4. The mitral valve is normal in structure. No evidence of mitral valve  regurgitation. No evidence of mitral stenosis.  5. The aortic valve is normal in structure. Aortic valve regurgitation is  not visualized. Mild to moderate aortic valve sclerosis/calcification is  present, without any evidence of aortic stenosis.  6. Very challenging image quality with  limited views.   TELEMETRY: Atrial fibrillation with HR 90-110 bpm  ASSESSMENT AND PLAN:  Principal Problem:   Acute on chronic diastolic congestive heart failure (HCC) Active Problems:   Acute on chronic respiratory failure with hypoxia and hypercapnia (HCC)   Morbid obesity with BMI of 50.0-59.9, adult (HCC)   Elevated troponin   Acute renal failure (HCC)   Chronic acquired lymphedema   Leukocytosis   Thrombocytopenia (HCC)   Anemia   Severe sepsis (HCC)   AF (paroxysmal atrial fibrillation) (HCC)   Acute respiratory failure with hypoxia and hypercapnia (HCC)   Lobar pneumonia (HCC)   Insomnia   Confusion   Acute delirium    1. Acute on chronic diastolic CHF, reasonably stable  -Continue metoprolol and diuresis with oral lasix.   -Continue low sodium diet, daily weights, strict I & O's.   -Follow up with [facility] HF clinic and [facility] Cardiology in 1 week after discharge.   2. Atiral fibrillation with RVR, stable  -continue amiodarone and diltiazem therapy.  -Continue anticoagulation with elquis. Recommend following bleeding precautions.   - Continuous telemetry monitoring until discharge.   -Follow up with [facility] Cardiology in 1 week after discharge.   3. HTN, reasonably controlled  -Continue metoprolol and diltiazem therapy.   -monitor bp q4h per protocol.  - recommend DASH diet upon discharge.   4. Acute respiratory failure with hypoxia  -Agree with current treatment plan.   5. AKI, fairly stable  -Agree with current plan.   6. Morbid obesity  -Recommend significant weight loss, portion control and daily chair exercises for 30 minutes as tolerated.     [place], ACNPC-AG  11/01/2020 12:57 PM

## 2020-11-01 NOTE — ED Notes (Signed)
Pt provided with diet tray at this time and fluids. Aware that she awaits to see case management/ social work in AM.

## 2020-11-01 NOTE — Progress Notes (Signed)
PIVs and tele removed from patient. Patient discharged per orders. Taken down to main lobby with O2 via wheelchair by nursing staff.
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
## 2020-11-01 NOTE — Progress Notes (Signed)
     Durable Medical Equipment  (From admission, onward)         Start     Ordered   11/01/20 1526  For home use only DME Hospital bed  Once       Question Answer Comment  Length of Need Lifetime   Patient has (list medical condition): Congestive Heart Failure   The above medical condition requires: Patient requires the ability to reposition frequently   Head must be elevated greater than: 30 degrees   Bed type Semi-electric   Support Surface: Gel Overlay      11/01/20 1527   11/01/20 1305  For home use only DME Other see comment  Once       Comments: HOYER LIFT  Question:  Length of Need  Answer:  Lifetime   11/01/20 1304

## 2020-11-01 NOTE — Care Management Important Message (Signed)
Important Message  Patient Details  Name: RUHAMA LEHEW MRN: [id number] Date of Birth: 11-Jun-1956   Medicare Important Message Given:  Yes     [name] 11/01/2020, 1:30 PM

## 2020-11-01 NOTE — Plan of Care (Signed)

## 2020-11-01 NOTE — Discharge Summary (Signed)
[facility] NAME: Carmen [name]    MR#:  [id number]  DATE OF BIRTH:  16-Jan-1956  DATE OF ADMISSION:  10/24/2020 ADMITTING PHYSICIAN: Athena Masse, MD  DATE OF DISCHARGE: 11/01/2020  PRIMARY CARE PHYSICIAN: Cletis Athens, MD    ADMISSION DIAGNOSIS:  Shortness of breath [R06.02] Confusion [R41.0] Hypoxia [R09.02] Acute CHF (congestive heart failure) (HCC) [I50.9] Acute on chronic diastolic congestive heart failure (HCC) [I50.33] Cardiorenal syndrome with renal failure, stage 1-4 or unspecified chronic kidney disease, with heart failure (HCC) [I13.0]  DISCHARGE DIAGNOSIS:  Acute on chronic hypoxic hyper cardiac respiratory failure secondary to pneumonia, severe sepsis and CHF improved Acute on chronic diastolic congestive heart failure with Nahser, and chronic lymphedema requiring IV Lasix Paroxysmal atrial fibrillation on anticoagulation cardio renal syndrome morbid obesity SECONDARY DIAGNOSIS:   Past Medical History:  Diagnosis Date  . CHF (congestive heart failure) ([place])   . Endometrial cancer (HCC)    Grade 1  . Hypertension   . Lymphedema   . Morbid obesity ([place])   . Osteoarthritis of both knees     HOSPITAL COURSE:  64 y.o.femalewith medical history significant forchronic diastolic heart failure, morbid obesity, chronic lymphedema, chronic respiratory failure on home O2 at 2 to 4 L, recently hospitalized from 10/31-11/11 with respiratory failure from CHF exacerbation, at which time baseline O2 was increased from 2 L,to 2 to 4 L, who presents to the emergency room from [facility] with increased shortness of breath   Acute on chronic hypoxic hypercarbic respiratory failure secondary to pneumonia and severe sepsis. Patient also has CHF.  --sepsis resolved --Patient chronically wears oxygen anywhere from 2 to 4 L.  --Continue to keep oxygen and as low as possible around 4 L.  --Patient did have an ABG that  showed a pH of 7.25 (less than 7.35), PCO2 of 67 (above 55), PO2 of 55.  --Patient qualifies for noninvasive ventilation at night. -- Education officer, museum has arranged it for home --does not tolerate the BiPAP very well so hopefully will tolerate trilogy better. -- Overall appears stable  Insomnia and paranoid thoughts. --psychiatry consultation appreciated -- patient is refused to get IV Haldol  -- continue seroquel at night.   Severe sepsis secondary to pneumonia, present on admission patient also had acute renal failure.  --Patient finished Rocephin and  doxycycline. --Procalcitonin elevated at 61 and down to 5.35   Acute on chronic diastolic congestive heart failure with anasarca and chronic lymphedema.  --On Lasix drip since 10/28/20--started by Dr Cammie Sickle --Total UOP 16000 cc  -- weight trending down. Patient has significant improvement and chronic leg edema/lymphedema. Change to PO Lasix 40 BID with potassium replacement. -- Continue beta-blocker, Cardizem, low-dose ACE inhibitor. -- Patient will follow-up with Dr. Nehemiah Massed as outpatient. Informed of discharge to cardiology  Acute renal failure cardio renal syndrome -Patient's creatinine up as high as 2.42 which is greater than 1.5 times baseline creatinine within 7 days.  -No labs drawn from this morning but yesterday's creatinine 1.24. --creatinine improving --pt refusing lab drwa  Paroxysmal atrial fibrillation  --on oral amiodarone oral metoprolol and Cardizem.  --Continue Eliquis for anticoagulation  Weakness. Continue physical therapy evaluation. Patient does not want to go back to rehab. Home health PT and RN arrange. Hospital bed and [facility] lift prescribed  Thrombocytopenia. Platelet count now up to 173  Morbid obesity with a BMI of 58.95  patient overall is best at baseline. Will discharged to home. She is at  a high risk for readmission with multiple comorbidities  Procedures:none Family  communication :Husband at bedside Consults :Psych, cardiology CODE STATUS: DNR DVT Prophylaxis :eliquis  Status is: Inpatient   Dispo: The patient is from: SNF  Anticipated d/c is to: TBD  Anticipated d/c date is: today  Patient currently is medically stable to d/c.  CONSULTS OBTAINED:  Treatment Team:  Corey Skains, MD Fritzi Mandes, MD  DRUG ALLERGIES:   Allergies  Allergen Reactions  . Clarithromycin     REACTION: respiratory issues, can't breathe  . Penicillins     REACTION: can't breath    DISCHARGE MEDICATIONS:   Allergies as of 11/01/2020      Reactions   Clarithromycin    REACTION: respiratory issues, can't breathe   Penicillins    REACTION: can't breath      Medication List    STOP taking these medications   carvedilol 6.25 MG tablet Commonly known as: COREG     TAKE these medications   amiodarone 400 MG tablet Commonly known as: PACERONE Take 1 tablet (400 mg total) by mouth daily. Start taking on: November 02, 2020   apixaban 5 MG Tabs tablet Commonly known as: ELIQUIS Take 1 tablet (5 mg total) by mouth 2 (two) times daily.   diltiazem 240 MG 24 hr capsule Commonly known as: CARDIZEM CD Take 1 capsule (240 mg total) by mouth daily. Start taking on: November 02, 2020   enalapril 5 MG tablet Commonly known as: VASOTEC Take 1 tablet (5 mg total) by mouth daily. What changed:  medication strength how much to take   fexofenadine 180 MG tablet Commonly known as: ALLEGRA Take 180 mg by mouth daily.   furosemide 40 MG tablet Commonly known as: LASIX Take 1 tablet (40 mg total) by mouth 2 (two) times daily.   MEGA MULTI WOMEN PO Take 1,200 mg by mouth daily.   metoprolol tartrate 25 MG tablet Commonly known as: LOPRESSOR Take 1 tablet (25 mg total) by mouth 2 (two) times daily.   potassium chloride 10 MEQ tablet Commonly known as: KLOR-CON TAKE 1 TABLET AT BEDTIME   QUEtiapine 25 MG  tablet Commonly known as: SEROQUEL Take 1 tablet (25 mg total) by mouth at bedtime.   vitamin E 180 MG (400 UNITS) capsule Take 400 Units by mouth 2 (two) times daily.            Durable Medical Equipment  (From admission, onward)         Start     Ordered   11/01/20 1305  For home use only DME Other see comment  Once       Comments: HOYER LIFT  Question:  Length of Need  Answer:  Lifetime   11/01/20 1304   11/01/20 1302  For home use only DME Hospital bed  Once       Question Answer Comment  Length of Need Lifetime   Bed type Semi-electric      11/01/20 1304          If you experience worsening of your admission symptoms, develop shortness of breath, life threatening emergency, suicidal or homicidal thoughts you must seek medical attention immediately by calling 911 or calling your MD immediately  if symptoms less severe.  You Must read complete instructions/literature along with all the possible adverse reactions/side effects for all the Medicines you take and that have been prescribed to you. Take any new Medicines after you have completely understood and accept all the possible  adverse reactions/side effects.   Please note  You were cared for by a hospitalist during your hospital stay. If you have any questions about your discharge medications or the care you received while you were in the hospital after you are discharged, you can call the unit and asked to speak with the hospitalist on call if the hospitalist that took care of you is not available. Once you are discharged, your primary care physician will handle any further medical issues. Please note that NO REFILLS for any discharge medications will be authorized once you are discharged, as it is imperative that you return to your primary care physician (or establish a relationship with a primary care physician if you do not have one) for your aftercare needs so that they can reassess your need for medications and  monitor your lab values. Today   SUBJECTIVE   Overall feels better. Patient appears to be in a better mood. Still complains of not able to sleep at night due to a lot of noise in the hospital requesting to go home where she can rest well  VITAL SIGNS:  Blood pressure 139/78, pulse 73, temperature 98 F (36.7 C), temperature source Oral, resp. rate 20, height 5\' 1"  (1.549 m), weight 132.8 kg, SpO2 (!) 88 %.  I/O:    Intake/Output Summary (Last 24 hours) at 11/01/2020 1318 Last data filed at 11/01/2020 1008 Gross per 24 hour  Intake 998.7 ml  Output 3725 ml  Net -2726.3 ml    PHYSICAL EXAMINATION:  GENERAL:  63 y.o.-year-old patient lying in the bed with no acute distress.  Morbid obesity HEENT: Head atraumatic, normocephalic. Oropharynx and nasopharynx clear.  NECK:  Supple, no jugular venous distention. No thyroid enlargement, no tenderness.  LUNGS:decreased  breath sounds bilaterally, no wheezing, rales, rhonchi. No use of accessory muscles of respiration.  CARDIOVASCULAR: S1, S2 normal. No murmurs, rubs, or gallops.  ABDOMEN: Soft, nontender, nondistended. Bowel sounds present. No organomegaly or mass.  EXTREMITIES  Chronic Bilateral LE edema improved a lot!! chronic lymphedema NEUROLOGIC: Cranial nerves II through XII are intact. No focal Motor or sensory deficits b/l.  Debility+ PSYCHIATRIC:  patient is alert and oriented x 3. Anxious+ SKIN: No obvious rash, lesion, or ulcer.   DATA REVIEW:   CBC  Recent Labs  Lab 10/28/20 0437  WBC 9.5  HGB 11.7*  HCT 38.1  PLT 173    Chemistries  Recent Labs  Lab 10/27/20 0537 10/28/20 0437 10/30/20 1442  NA 139   < > 144  K 4.3   < > 4.3  CL 102   < > 101  CO2 28   < > 34*  GLUCOSE 130*   < > 135*  BUN 59*   < > 56*  CREATININE 2.10*   < > 1.24*  CALCIUM 7.9*   < > 8.5*  MG  --    < > 2.1  AST 34  --   --   ALT 24  --   --   ALKPHOS 139*  --   --   BILITOT 1.1  --   --    < > = values in this interval not  displayed.    Microbiology Results   Recent Results (from the past 240 hour(s))  Culture, blood (x 2)     Status: None   Collection Time: 10/24/20  7:00 PM   Specimen: BLOOD  Result Value Ref Range Status   Specimen Description BLOOD LEFT FA  Final  Special Requests   Final    BOTTLES DRAWN AEROBIC AND ANAEROBIC Blood Culture results may not be optimal due to an excessive volume of blood received in culture bottles   Culture   Final    NO GROWTH 5 DAYS Performed at [facility], [address] [place], [place] [address]    Report Status 10/30/2020 FINAL  Final  Resp Panel by RT-PCR (Flu A&B, Covid) Nasopharyngeal Swab     Status: None   Collection Time: 10/24/20  9:05 PM   Specimen: Nasopharyngeal Swab; Nasopharyngeal(NP) swabs in vial transport medium  Result Value Ref Range Status   SARS Coronavirus 2 by RT PCR NEGATIVE NEGATIVE Final    Comment: (NOTE) SARS-CoV-2 target nucleic acids are NOT DETECTED.  The SARS-CoV-2 RNA is generally detectable in upper respiratory specimens during the acute phase of infection. The lowest concentration of SARS-CoV-2 viral copies this assay can detect is 138 copies/mL. A negative result does not preclude SARS-Cov-2 infection and should not be used as the sole basis for treatment or other patient management decisions. A negative result may occur with  improper specimen collection/handling, submission of specimen other than nasopharyngeal swab, presence of viral mutation(s) within the areas targeted by this assay, and inadequate number of viral copies(<138 copies/mL). A negative result must be combined with clinical observations, patient history, and epidemiological information. The expected result is Negative.  Fact Sheet for Patients:  EntrepreneurPulse.com.au  Fact Sheet for Healthcare Providers:  IncredibleEmployment.be  This test is no t yet approved or cleared by the [place] FDA  and  has been authorized for detection and/or diagnosis of SARS-CoV-2 by FDA under an Emergency Use Authorization (EUA). This EUA will remain  in effect (meaning this test can be used) for the duration of the COVID-19 declaration under Section 564(b)(1) of the Act, 21 U.S.C.section 360bbb-3(b)(1), unless the authorization is terminated  or revoked sooner.       Influenza A by PCR NEGATIVE NEGATIVE Final   Influenza B by PCR NEGATIVE NEGATIVE Final    Comment: (NOTE) The Xpert Xpress SARS-CoV-2/FLU/RSV plus assay is intended as an aid in the diagnosis of influenza from Nasopharyngeal swab specimens and should not be used as a sole basis for treatment. Nasal washings and aspirates are unacceptable for Xpert Xpress SARS-CoV-2/FLU/RSV testing.  Fact Sheet for Patients: EntrepreneurPulse.com.au  Fact Sheet for Healthcare Providers: IncredibleEmployment.be  This test is not yet approved or cleared by the [place] FDA and has been authorized for detection and/or diagnosis of SARS-CoV-2 by FDA under an Emergency Use Authorization (EUA). This EUA will remain in effect (meaning this test can be used) for the duration of the COVID-19 declaration under Section 564(b)(1) of the Act, 21 U.S.C. section 360bbb-3(b)(1), unless the authorization is terminated or revoked.  Performed at [facility], [address] [place], [place] [address]   Culture, group A strep     Status: None   Collection Time: 10/24/20 10:05 PM   Specimen: Throat  Result Value Ref Range Status   Specimen Description   Final    THROAT Performed at [facility], [address], [place], [place] [address]    Special Requests   Final    NONE Performed at [facility], [address] [place], [place] [address]    Culture   Final    NO GROUP A STREP (S.PYOGENES) ISOLATED Performed at [facility], [place] [address], [place], [place] [address]     Report Status  10/27/2020 FINAL  Final  Culture, blood (x 2)     Status: None   Collection Time: 10/25/20  8:59 AM   Specimen: BLOOD  Result Value Ref Range Status   Specimen Description BLOOD RIGHT HAND  Final   Special Requests   Final    BOTTLES DRAWN AEROBIC AND ANAEROBIC Blood Culture adequate volume   Culture   Final    NO GROWTH 5 DAYS Performed at [facility], [address] [place], [place] [address]    Report Status 10/30/2020 FINAL  Final  MRSA PCR Screening     Status: None   Collection Time: 10/25/20  1:26 PM   Specimen: Nasopharyngeal  Result Value Ref Range Status   MRSA by PCR NEGATIVE NEGATIVE Final    Comment:        The GeneXpert MRSA Assay (FDA approved for NASAL specimens only), is one component of a comprehensive MRSA colonization surveillance program. It is not intended to diagnose MRSA infection nor to guide or monitor treatment for MRSA infections. Performed at [facility], [address], [place], [place] [address]   Urine Culture     Status: None   Collection Time: 10/25/20  4:41 PM   Specimen: Urine, Random  Result Value Ref Range Status   Specimen Description   Final    URINE, RANDOM Performed at [facility], [address] [place], [place] [address]    Special Requests   Final    NONE Performed at [facility], [address] [place], [place] [address]    Culture   Final    NO GROWTH Performed at [facility], [place] [address] [place], [place] [address]    Report Status 10/27/2020 FINAL  Final    RADIOLOGY:  No results found.   CODE STATUS:     Code Status Orders  (From admission, onward)         Start     Ordered   10/26/20 1721  Do not attempt resuscitation (DNR)  Continuous       Question Answer Comment  In the event of cardiac or respiratory ARREST Do not call a "code blue"   In the event of cardiac or respiratory ARREST Do not perform Intubation, CPR, defibrillation  or ACLS   In the event of cardiac or respiratory ARREST Use medication by any route, position, wound care, and other measures to relive pain and suffering. May use oxygen, suction and manual treatment of airway obstruction as needed for comfort.      10/26/20 1720        Code Status History    Date Active Date Inactive Code Status Order ID Comments User Context   10/24/2020 2124 10/26/2020 1720 Full Code [id number]  Athena Masse, MD ED   09/27/2020 0215 10/08/2020 2252 Full Code [id number]  Athena Masse, MD ED   Advance Care Planning Activity       TOTAL TIME TAKING CARE OF THIS PATIENT: 35 minutes.    Fritzi Mandes M.D  [facility]    CC: Primary care physician; Cletis Athens, MD

## 2020-11-02 MED ORDER — ACETAMINOPHEN 500 MG PO TABS
1000.0000 mg | ORAL_TABLET | Freq: Once | ORAL | Status: AC
  Administered 2020-11-02: 1000 mg via ORAL
  Filled 2020-11-02: qty 2

## 2020-11-02 NOTE — NC FL2 (Signed)
[place] LEVEL OF CARE SCREENING TOOL     IDENTIFICATION  Patient Name: Carmen [name] Birthdate: 06-Aug-1956 Sex: female Admission Date (Current Location): 11/01/2020  [place] and [place] Number:  Engineering geologist and Address:  [facility], [address] [place], [place] [address]      Provider Number: [id number]  Attending Physician Name and Address:  No att. providers found  Relative Name and Phone Number:  Rosie, Torrez (Spouse) [phone]    Current Level of Care: Hospital Recommended Level of Care: [place] Prior Approval Number:    Date Approved/Denied:   PASRR Number: [id number]  Discharge Plan: SNF    Current Diagnoses: Patient Active Problem List   Diagnosis Date Noted  . Insomnia   . Confusion   . Acute delirium   . Acute respiratory failure with hypoxia and hypercapnia (HCC)   . Lobar pneumonia ([place])   . Severe sepsis ([place])   . AF (paroxysmal atrial fibrillation) ([place])   . Acute renal failure ([place]) 10/24/2020  . Chronic acquired lymphedema 10/24/2020  . Leukocytosis 10/24/2020  . Thrombocytopenia ([place]) 10/24/2020  . Anemia 10/24/2020  . Acute on chronic respiratory failure with hypoxia and hypercapnia ([place]) 09/27/2020  . Morbid obesity with BMI of 50.0-59.9, adult ([place]) 09/27/2020  . Chronic diastolic CHF (congestive heart failure) ([place]) 09/27/2020  . Elevated troponin 09/27/2020  . Acute on chronic diastolic congestive heart failure ([place]) 09/27/2020  . Acute CHF (congestive heart failure) ([place]) 09/27/2020  . Uterine carcinoma ([place]) 01/30/2012  . ABNORMAL GLANDULAR PAPANICOLAOU SMEAR OF CERVIX 10/10/2010  . SHORTNESS OF BREATH 09/30/2010  . OBESITY 09/02/2010  . KNEE PAIN, BILATERAL 09/02/2010    Orientation RESPIRATION BLADDER Height & Weight     Self, Situation, Place, Time  O2 (chronic 4L [place]) Incontinent Weight: (!) 137 kg Height:  5\' 1"  (154.9 cm)  BEHAVIORAL SYMPTOMS/MOOD  NEUROLOGICAL BOWEL NUTRITION STATUS      Incontinent Diet (regular)  AMBULATORY STATUS COMMUNICATION OF NEEDS Skin   Extensive Assist Verbally Normal                       Personal Care Assistance Level of Assistance  Bathing, Dressing Bathing Assistance: Limited assistance Feeding assistance: Limited assistance Dressing Assistance: Limited assistance Total Care Assistance: Maximum assistance   Functional Limitations Info  Sight, Speech, Hearing Sight Info: Adequate Hearing Info: Adequate Speech Info: Adequate    SPECIAL CARE FACTORS FREQUENCY  PT (By licensed PT), OT (By licensed OT)     PT Frequency: 5x a week OT Frequency: 5x a week            Contractures Contractures Info: Not present    Additional Factors Info  Code Status, Allergies Code Status Info: Full Allergies Info: clarithromycin, PCN           Current Medications (11/02/2020):  This is the current hospital active medication list Current Facility-Administered Medications  Medication Dose Route Frequency Provider Last Rate Last Admin  . amiodarone (PACERONE) tablet 400 mg  400 mg Oral Daily Carrie Mew, MD   400 mg at 11/02/20 1010  . apixaban (ELIQUIS) tablet 5 mg  5 mg Oral BID Carrie Mew, MD   5 mg at 11/02/20 1010  . diltiazem (CARDIZEM CD) 24 hr capsule 240 mg  240 mg Oral Daily Carrie Mew, MD   240 mg at 11/02/20 1009  . enalapril (VASOTEC) tablet 5 mg  5 mg Oral Daily Carrie Mew, MD  5 mg at 11/02/20 1009  . furosemide (LASIX) tablet 40 mg  40 mg Oral BID Carrie Mew, MD   40 mg at 11/02/20 1009  . loratadine (CLARITIN) tablet 10 mg  10 mg Oral Daily Carrie Mew, MD   10 mg at 11/02/20 1009  . metoprolol tartrate (LOPRESSOR) tablet 25 mg  25 mg Oral BID Carrie Mew, MD   25 mg at 11/02/20 1008  . potassium chloride SA (KLOR-CON) CR tablet 10 mEq  10 mEq Oral QHS Carrie Mew, MD   10 mEq at 11/01/20 2147  . QUEtiapine (SEROQUEL) tablet 25 mg   25 mg Oral QHS Carrie Mew, MD   25 mg at 11/01/20 2148   Current Outpatient Medications  Medication Sig Dispense Refill  . amiodarone (PACERONE) 400 MG tablet Take 1 tablet (400 mg total) by mouth daily. 30 tablet 1  . apixaban (ELIQUIS) 5 MG TABS tablet Take 1 tablet (5 mg total) by mouth 2 (two) times daily. 60 tablet 3  . diltiazem (CARDIZEM CD) 240 MG 24 hr capsule Take 1 capsule (240 mg total) by mouth daily. 30 capsule 1  . enalapril (VASOTEC) 5 MG tablet Take 1 tablet (5 mg total) by mouth daily. 30 tablet 1  . fexofenadine (ALLEGRA) 180 MG tablet Take 180 mg by mouth daily.    . furosemide (LASIX) 40 MG tablet Take 1 tablet (40 mg total) by mouth 2 (two) times daily. 30 tablet 1  . metoprolol tartrate (LOPRESSOR) 25 MG tablet Take 1 tablet (25 mg total) by mouth 2 (two) times daily. 60 tablet 1  . Multiple Vitamins-Minerals (MEGA MULTI WOMEN PO) Take 1,200 mg by mouth daily.    . potassium chloride (KLOR-CON) 10 MEQ tablet TAKE 1 TABLET AT BEDTIME 90 tablet 3  . QUEtiapine (SEROQUEL) 25 MG tablet Take 1 tablet (25 mg total) by mouth at bedtime. 30 tablet 0  . vitamin E 400 UNIT capsule Take 400 Units by mouth 2 (two) times daily.       Discharge Medications: Please see discharge summary for a list of discharge medications.  Relevant Imaging Results:  Relevant Lab Results:   Additional Information SS #: [place] A Deshay Blumenfeld, Therapist, sports

## 2020-11-02 NOTE — Evaluation (Signed)
Occupational Therapy Evaluation Patient Details Name: Carmen [name] MRN: [id number] DOB: [birth year] Today's Date: 11/02/2020    History of Present Illness Carmen [name] is a 64 y.o. female presented to the ED with c/o generalized weakness and falls. Pt was hospitalized for AMS and DC'd home on 12/6 despite therapy recommendations for pt to return to [facility] where she had been staying for past month. PMH includes: CHF, obesity, atrial fibrillation, lymphedema, osteoarthritis of B knees.   Clinical Impression   Ms. Calamia was seen for OT evaluation this date. Pt recently hospitalized DC'd to home despite therapy recommendations for STR, however, was unable to make it in and returned to the hospital. Pt was in SNF prior to initial hospital admission. Pt lives with her spouse in a 1 level home with 3 STE. Currently pt demonstrates impairments as described below (See OT problem list) which functionally limit her ability to perform ADL/self-care tasks. Pt currently requires MAX A +2 for rolling side to side in bed, MAX +2 for LB ADL at bed level, and set-up to MIN A for UB ADL mgt at bed-level.  Pt would benefit from skilled OT services to address noted impairments and functional limitations (see below for any additional details) in order to maximize safety and independence while minimizing falls risk and caregiver burden. Upon hospital discharge, recommend STR to maximize pt safety and return to PLOF.      Follow Up Recommendations  SNF    Equipment Recommendations  3 in 1 bedside commode Judie Petit [facility])    Recommendations for Other Services       Precautions / Restrictions Precautions Precautions: Fall Restrictions Weight Bearing Restrictions: No Other Position/Activity Restrictions: Per MD- no plan for radiology/ortho follow up. Bilateral LE lymphedema      Mobility Bed Mobility Overal bed mobility: Needs Assistance Bed Mobility: Rolling Rolling: +2 for physical assistance;Max assist          General bed mobility comments: Pt declines OOB/EOB activity this date 2/2 BLE pain. Agreeable to rolling in bed with max encouragement. Requires MAX A +2 for limited rolling side-to side in bed.    Transfers Overall transfer level: Needs assistance               General transfer comment: Pt unsafe/unable to attempt functional transfers.    Balance Overall balance assessment: Needs assistance Sitting-balance support: Bilateral upper extremity supported;Feet supported Sitting balance-Leahy Scale: Poor Sitting balance - Comments: Pt able to use BUE support on bed rails to bring self into long-sitting briefly in bed, otherwise very limited sitting balance.       Standing balance comment: Not tested.                           ADL either performed or assessed with clinical judgement   ADL Overall ADL's : Needs assistance/impaired                                       General ADL Comments: Pt requires MAX +2 assist for LB ADL mgt at bed level 2/2 generalized weakness, BLE pain, and limited activity tolerance. Set-up/supervision for bed level grooming and self-feeding. MIN-MOD A for UB bathing, dressing. Functional mobility unsafe at this time.     Vision Patient Visual Report: No change from baseline       Perception     Praxis  Pertinent Vitals/Pain Pain Assessment: 0-10 Pain Score: 10-Worst pain ever Pain Location: B knees Pain Descriptors / Indicators: Aching;Grimacing;Guarding Pain Intervention(s): Limited activity within patient's tolerance;Monitored during session;Patient requesting pain meds-RN notified     Hand Dominance Right   Extremity/Trunk Assessment Upper Extremity Assessment Upper Extremity Assessment: Generalized weakness (AROM WFL, generally weak.)   Lower Extremity Assessment Lower Extremity Assessment: Generalized weakness;Defer to PT evaluation       Communication Communication Communication: No  difficulties   Cognition Arousal/Alertness: Awake/alert Behavior During Therapy: WFL for tasks assessed/performed Overall Cognitive Status: No family/caregiver present to determine baseline cognitive functioning                                 General Comments: A&O x4, however pt is easily distracted, follows commands with additional cues/time/encouragement.   General Comments       Exercises Other Exercises Other Exercises: Pt educated on role of OT in acute setting, importance of mobility during hospital stay, and bed mobility this date. Other Exercises: OT/PT facilitate +2 Max A for bed mobility.   Shoulder Instructions      Home Living Family/patient expects to be discharged to:: Private residence Living Arrangements: Spouse/significant other Available Help at Discharge: Family Type of Home: House Home Access: Stairs to enter Technical brewer of Steps: 3 Entrance Stairs-Rails: Left Home Layout: One level     Bathroom Shower/Tub: Occupational psychologist: Handicapped height     Home Equipment: Environmental consultant - 2 wheels;Cane - single point;Shower seat - built in;Grab bars - tub/shower;Grab bars - toilet          Prior Functioning/Environment Level of Independence: Needs assistance  Gait / Transfers Assistance Needed: Per chart, Pt was ambulatory short distances in home with walker vs cane depending on how pt is feeling (about 1 month ago); 3-4 L home O2; had been at [facility] for ~1 month prior to initial admission. ADL's / Homemaking Assistance Needed: Pt states she was able to dress and bathe independently on good days when at home. Husband provides PRN assist,            OT Problem List: Decreased strength;Decreased cognition;Decreased activity tolerance;Impaired balance (sitting and/or standing);Decreased knowledge of use of DME or AE;Obesity;Cardiopulmonary status limiting activity;Decreased safety awareness      OT  Treatment/Interventions: Self-care/ADL training;Therapeutic exercise;Energy conservation;DME and/or AE instruction;Therapeutic activities;Patient/family education;Balance training    OT Goals(Current goals can be found in the care plan section) Acute Rehab OT Goals Patient Stated Goal: To go to rehab OT Goal Formulation: With patient Time For Goal Achievement: 11/16/20 Potential to Achieve Goals: Good ADL Goals Pt Will Perform Grooming: sitting;with min assist;with set-up Pt Will Transfer to Toilet: with +2 assist;with max assist;stand pivot transfer;bedside commode (BARI BSC.) Pt Will Perform Toileting - Clothing Manipulation and hygiene: sitting/lateral leans;with adaptive equipment;with min assist Additional ADL Goal #1: Pt will utilize at least 1 energy conservation strategies during functional activities with modified independence to support safety and minimize SOB upon exertion.  OT Frequency: Min 1X/week   Barriers to D/C: Inaccessible home environment;Decreased caregiver support          Co-evaluation              AM-PAC OT "6 Clicks" Daily Activity     Outcome Measure Help from another person eating meals?: A Little Help from another person taking care of personal grooming?: A Little Help from another person  toileting, which includes using toliet, bedpan, or urinal?: Total Help from another person bathing (including washing, rinsing, drying)?: A Lot Help from another person to put on and taking off regular upper body clothing?: A Little Help from another person to put on and taking off regular lower body clothing?: A Lot 6 Click Score: 14   End of Session Equipment Utilized During Treatment: Oxygen Nurse Communication: Mobility status;Patient requests pain meds  Activity Tolerance: Patient limited by pain Patient left: in bed;with call bell/phone within reach (In gurney bed with B rails raised.)  OT Visit Diagnosis: Other abnormalities of gait and mobility  (R26.89);Muscle weakness (generalized) (M62.81)                Time: 2841-3244 OT Time Calculation (min): 15 min Charges:  OT General Charges $OT Visit: 1 Visit OT Evaluation $OT Eval High Complexity: 1 High  Shara Blazing, M.S., OTR/L Ascom: [phone] 11/02/20, 2:15 PM

## 2020-11-02 NOTE — TOC Progression Note (Signed)
Transition of Care [facility]) - Progression Note    Patient Details  Name: Carmen [name] MRN: [id number] Date of Birth: 1956/02/07  Transition of Care [facility]) CM/SW [place], RN Phone Number: 11/02/2020, 3:27 PM  Clinical Narrative:     PASSR, FL2 and Bed Search Started.  Expected Discharge Plan: Skilled Nursing Facility Barriers to Discharge: Other (comment) (PT and OT evaluation.)  Expected Discharge Plan and Services Expected Discharge Plan: [place]   Discharge Planning Services: CM Consult Post Acute Care Choice: [place] Living arrangements for the past 2 months: Single Family Home                                       Social Determinants of Health (SDOH) Interventions    Readmission Risk Interventions Readmission Risk Prevention Plan 10/28/2020  Transportation Screening Complete  PCP or Specialist Appt within 3-5 Days Not Complete  Not Complete comments Pending discharge, will be scheduled by unit secretary.  [place] or Home Care Consult Complete  Social Work Consult for [place] Planning/Counseling Complete  Palliative Care Screening Not Applicable  Medication Review Press photographer) Complete  Some recent data might be hidden

## 2020-11-02 NOTE — Evaluation (Signed)
Physical Therapy Evaluation Patient Details Name: Carmen [name] MRN: [id number] DOB: 1956/04/21 Today's Date: 11/02/2020   History of Present Illness  Carmen [name] is a 64 y.o. female presented to the ED with c/o generalized weakness and falls. Pt was hospitalized for AMS and DC'd home on 12/6 despite therapy recommendations for pt to return to STR where she had been staying for past month. PMH includes: CHF, obesity, atrial fibrillation, lymphedema, osteoarthritis of B knees.  Clinical Impression  Patient received in ED on stretcher. Agreeable to PT/OT assessment with encouragement due to the fact that she is hungry and wants to eat. Patient has very low tolerance for LE mobility assessment due to severe pain with attempts to move. She demonstrates very little active motion of either leg. Requires +2 assist to roll on stretcher to each side and max cues to assist by grabbing rails with UEs. She is moaning and crying during mobility due to pain. Patient will continue to benefit from skilled PT while here to improve functional mobility and independence.       Follow Up Recommendations SNF    Equipment Recommendations  Wheelchair (measurements PT);Wheelchair cushion (measurements PT);Hospital bed;Other (comment)    Recommendations for Other Services       Precautions / Restrictions Precautions Precautions: Fall Restrictions Weight Bearing Restrictions: No Other Position/Activity Restrictions: Per MD- no plan for radiology/ortho follow up. Bilateral LE lymphedema      Mobility  Bed Mobility Overal bed mobility: Needs Assistance Bed Mobility: Rolling Rolling: +2 for physical assistance;Max assist         General bed mobility comments: Pt declines OOB/EOB activity this date 2/2 BLE pain. Agreeable to rolling in bed with max encouragement. Requires MAX A +2 for limited rolling side-to side in bed.    Transfers Overall transfer level: Needs assistance                General transfer comment: Pt unsafe/unable to attempt functional transfers.  Ambulation/Gait             General Gait Details: Unable/not attempted  Stairs            Wheelchair Mobility    Modified Rankin (Stroke Patients Only)       Balance Overall balance assessment: Needs assistance Sitting-balance support: Bilateral upper extremity supported Sitting balance-Leahy Scale: Poor Sitting balance - Comments: Pt able to use BUE support on bed rails to bring self into long-sitting briefly in bed, otherwise very limited sitting balance.       Standing balance comment: Not tested.                             Pertinent Vitals/Pain Pain Assessment: Faces Pain Score: 10-Worst pain ever Faces Pain Scale: Hurts worst Pain Location: B LEs Pain Descriptors / Indicators: Aching;Grimacing;Guarding;Moaning;Crying Pain Intervention(s): Monitored during session;Repositioned    Home Living Family/patient expects to be discharged to:: Skilled nursing facility Living Arrangements: Spouse/significant other Available Help at Discharge: Family Type of Home: House Home Access: Stairs to enter Entrance Stairs-Rails: Left Entrance Stairs-Number of Steps: 3 Home Layout: One level Home Equipment: Walker - 2 wheels;Cane - single point;Shower seat - built in;Grab bars - tub/shower;Grab bars - toilet      Prior Function Level of Independence: Needs assistance   Gait / Transfers Assistance Needed: Per chart, Pt was ambulatory short distances in home with walker vs cane depending on how pt is feeling (about 1 month ago);  3-4 L home O2; had been at [facility] for ~1 month prior to initial admission.  ADL's / Homemaking Assistance Needed: Pt states she was able to dress and bathe independently on good days when at home. Husband provides PRN assist,  Comments: Patient reports she has good days and bad days. Uses a walker and/or cane at home at baseline for short  distances.     Hand Dominance   Dominant Hand: Right    Extremity/Trunk Assessment   Upper Extremity Assessment Upper Extremity Assessment: Defer to OT evaluation    Lower Extremity Assessment Lower Extremity Assessment: LLE deficits/detail;RLE deficits/detail RLE Deficits / Details: patient allowing minimal LE mobility due to severe pain, crying with attempts. She has trace active movement of R LE and no active movement of left LE other than anlke DF/PF RLE: Unable to fully assess due to pain RLE Coordination: decreased gross motor LLE: Unable to fully assess due to pain LLE Coordination: decreased gross motor    Cervical / Trunk Assessment Cervical / Trunk Exceptions: forward head/shoulders  Communication   Communication: No difficulties  Cognition Arousal/Alertness: Awake/alert Behavior During Therapy: WFL for tasks assessed/performed Overall Cognitive Status: No family/caregiver present to determine baseline cognitive functioning                                 General Comments: A&O x4, however pt is easily distracted, follows commands with additional cues/time/encouragement.      General Comments      Exercises Other Exercises Other Exercises: Pt educated on role of OT in acute setting, importance of mobility during hospital stay, and bed mobility this date. Other Exercises: OT/PT facilitate +2 Max A for bed mobility.   Assessment/Plan    PT Assessment Patient needs continued PT services  PT Problem List Decreased strength;Decreased activity tolerance;Decreased balance;Decreased mobility;Cardiopulmonary status limiting activity;Decreased knowledge of precautions;Obesity;Pain;Decreased range of motion       PT Treatment Interventions DME instruction;Functional mobility training;Balance training;Therapeutic exercise;Therapeutic activities;Patient/family education;Wheelchair mobility training    PT Goals (Current goals can be found in the Care Plan  section)  Acute Rehab PT Goals Patient Stated Goal: To go to rehab PT Goal Formulation: With patient Time For Goal Achievement: 11/16/20 Potential to Achieve Goals: Fair    Frequency Min 2X/week   Barriers to discharge Decreased caregiver support      Co-evaluation PT/OT/SLP Co-Evaluation/Treatment: Yes Reason for Co-Treatment: For patient/therapist safety;To address functional/ADL transfers PT goals addressed during session: Mobility/safety with mobility;Balance         AM-PAC PT "6 Clicks" Mobility  Outcome Measure Help needed turning from your back to your side while in a flat bed without using bedrails?: Total Help needed moving from lying on your back to sitting on the side of a flat bed without using bedrails?: Total Help needed moving to and from a bed to a chair (including a wheelchair)?: Total Help needed standing up from a chair using your arms (e.g., wheelchair or bedside chair)?: Total Help needed to walk in hospital room?: Total Help needed climbing 3-5 steps with a railing? : Total 6 Click Score: 6    End of Session Equipment Utilized During Treatment: Oxygen Activity Tolerance: Patient limited by pain;Patient limited by fatigue Patient left: in bed;with call bell/phone within reach Nurse Communication: Mobility status PT Visit Diagnosis: Other abnormalities of gait and mobility (R26.89);Muscle weakness (generalized) (M62.81);Difficulty in walking, not elsewhere classified (R26.2);Repeated falls (R29.6);Pain Pain -  Right/Left:  (bilateral)    Time: 1330-1340 PT Time Calculation (min) (ACUTE ONLY): 10 min   Charges:   PT Evaluation $PT Eval Moderate Complexity: 1 Mod          Sabriyah Wilcher, PT, GCS 11/02/20,3:10 PM

## 2020-11-02 NOTE — TOC Initial Note (Signed)
Transition of Care [facility]) - Initial/Assessment Note    Patient Details  Name: Carmen [name] MRN: [id number] Date of Birth: Apr 06, 1956  Transition of Care [facility]) CM/SW Contact:    Victorino Dike, RN Phone Number: 11/02/2020, 11:43 AM  Clinical Narrative:                  Met with patient in ER room 24, She reports she tried to go home yesterday with her husband and was unable to stand.  She now feels she does need to go to short term rehab and get stronger so she can return home with her hsuband.    Expected Discharge Plan: Skilled Nursing Facility Barriers to Discharge: Other (comment) (PT and OT evaluation.)   Patient Goals and CMS Choice Patient states their goals for this hospitalization and ongoing recovery are:: to go to short term rehab. CMS Medicare.gov Compare Post Acute Care list provided to:: Patient Choice offered to / list presented to : NA  Expected Discharge Plan and Services Expected Discharge Plan: [place]   Discharge Planning Services: CM Consult Post Acute Care Choice: [place] Living arrangements for the past 2 months: Single Family Home                                      Prior Living Arrangements/Services Living arrangements for the past 2 months: Single Family Home Lives with:: Spouse Patient language and need for interpreter reviewed:: Yes Do you feel safe going back to the place where you live?: Yes      Need for Family Participation in Patient Care: Yes (Comment) Care giver support system in place?: Yes (comment)   Criminal Activity/Legal Involvement Pertinent to Current Situation/Hospitalization: No - Comment as needed  Activities of Daily Living      Permission Sought/Granted                  Emotional Assessment Appearance:: Appears stated age Attitude/Demeanor/Rapport: Engaged Affect (typically observed): Accepting Orientation: : Oriented to Self, Oriented to Place, Oriented to  Time,  Oriented to Situation Alcohol / Substance Use: Not Applicable Psych Involvement: No (comment)  Admission diagnosis:  weakness ems Patient Active Problem List   Diagnosis Date Noted  . Insomnia   . Confusion   . Acute delirium   . Acute respiratory failure with hypoxia and hypercapnia (HCC)   . Lobar pneumonia ([place])   . Severe sepsis ([place])   . AF (paroxysmal atrial fibrillation) ([place])   . Acute renal failure ([place]) 10/24/2020  . Chronic acquired lymphedema 10/24/2020  . Leukocytosis 10/24/2020  . Thrombocytopenia ([place]) 10/24/2020  . Anemia 10/24/2020  . Acute on chronic respiratory failure with hypoxia and hypercapnia ([place]) 09/27/2020  . Morbid obesity with BMI of 50.0-59.9, adult ([place]) 09/27/2020  . Chronic diastolic CHF (congestive heart failure) ([place]) 09/27/2020  . Elevated troponin 09/27/2020  . Acute on chronic diastolic congestive heart failure ([place]) 09/27/2020  . Acute CHF (congestive heart failure) ([place]) 09/27/2020  . Uterine carcinoma ([place]) 01/30/2012  . ABNORMAL GLANDULAR PAPANICOLAOU SMEAR OF CERVIX 10/10/2010  . SHORTNESS OF BREATH 09/30/2010  . OBESITY 09/02/2010  . KNEE PAIN, BILATERAL 09/02/2010   PCP:  Cletis Athens, MD Pharmacy:   [facility], [address] [place] [address] Phone: [phone] Fax: [phone] - [place], [place] [address]  [place] [address] Phone: [phone] Fax: [phone]     Social Determinants of Health (SDOH) Interventions    Readmission Risk Interventions Readmission Risk Prevention Plan 10/28/2020  Transportation Screening Complete  PCP or Specialist Appt within 3-5 Days Not Complete  Not Complete comments Pending discharge, will be scheduled by unit secretary.  [place] or Home Care Consult Complete  Social Work Consult for [place] Planning/Counseling Complete  Palliative Care Screening Not Applicable   Medication Review Press photographer) Complete  Some recent data might be hidden

## 2020-11-02 NOTE — ED Notes (Signed)
Pt resting in NAD at this time.

## 2020-11-02 NOTE — ED Notes (Signed)
Pt provided with toothbrush and toothpaste per request
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
## 2020-11-03 ENCOUNTER — Ambulatory Visit: Payer: Medicare HMO | Admitting: Occupational Therapy

## 2020-11-03 MED ORDER — ACETAMINOPHEN 325 MG PO TABS
650.0000 mg | ORAL_TABLET | Freq: Four times a day (QID) | ORAL | Status: DC | PRN
  Administered 2020-11-03 – 2020-11-04 (×3): 650 mg via ORAL
  Filled 2020-11-03 (×3): qty 2

## 2020-11-03 NOTE — ED Notes (Signed)
PT ate 100% of supper. Repositioned pt's pillows back as she had them before supper.
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
## 2020-11-03 NOTE — TOC Progression Note (Addendum)
Transition of Care [facility]) - Progression Note    Patient Details  Name: Carmen [name] MRN: [id number] Date of Birth: 1956-06-05  Transition of Care [facility]) CM/SW [place], [place] Phone Number: [phone] 11/03/2020, 1:39 PM  Clinical Narrative:     Patient has potential bed offer at [facility], pending bed availability confirmation and insurance authorization.  CSW left message for [facility] admin coordinator [phone].  Update 2:45PM:  CSW spoke with Janie at [facility], she stated they currently do not have any beds available due to [place] outbreak in the facility.     Expected Discharge Plan: Skilled Nursing Facility Barriers to Discharge: Other (comment) (PT and OT evaluation.)  Expected Discharge Plan and Services Expected Discharge Plan: [place]   Discharge Planning Services: CM Consult Post Acute Care Choice: [place] Living arrangements for the past 2 months: Single Family Home                                       Social Determinants of Health (SDOH) Interventions    Readmission Risk Interventions Readmission Risk Prevention Plan 10/28/2020  Transportation Screening Complete  PCP or Specialist Appt within 3-5 Days Not Complete  Not Complete comments Pending discharge, will be scheduled by unit secretary.  [place] or Home Care Consult Complete  Social Work Consult for [place] Planning/Counseling Complete  Palliative Care Screening Not Applicable  Medication Review Press photographer) Complete  Some recent data might be hidden

## 2020-11-03 NOTE — ED Notes (Signed)
VOLUNTARY/pending placement 

## 2020-11-03 NOTE — ED Notes (Signed)
PT contacted to come and evaluate pt after she eats lunch.

## 2020-11-03 NOTE — ED Notes (Addendum)
PT c/o nausea and requesting mountain dew. PT given gingerale.

## 2020-11-03 NOTE — ED Notes (Signed)
Called to order pt dinner

## 2020-11-03 NOTE — ED Notes (Signed)
PT given ice chips per request. PT has her husband at bedside.

## 2020-11-04 NOTE — TOC Progression Note (Addendum)
Transition of Care [facility]) - Progression Note    Patient Details  Name: [name] MRN: [id number] Date of Birth: Jul 02, 1956  Transition of Care [facility]) CM/SW [place], [place] Phone Number:  [phone] 11/04/2020, 10:41 AM  Clinical Narrative:     Patient has bed offer at [facility], pending insurance authorization. CSW spoke with patient ans updated her on bed offer.  Patient is not vaccinated.  CSW explained possible SNF placement process and timeline. Patient verbalized understanding.  Expected Discharge Plan: Skilled Nursing Facility Barriers to Discharge: Other (comment) (PT and OT evaluation.)  Expected Discharge Plan and Services Expected Discharge Plan: [place]   Discharge Planning Services: CM Consult Post Acute Care Choice: [place] Living arrangements for the past 2 months: Single Family Home                                       Social Determinants of Health (SDOH) Interventions    Readmission Risk Interventions Readmission Risk Prevention Plan 10/28/2020  Transportation Screening Complete  PCP or Specialist Appt within 3-5 Days Not Complete  Not Complete comments Pending discharge, will be scheduled by unit secretary.  [place] or Home Care Consult Complete  Social Work Consult for [place] Planning/Counseling Complete  Palliative Care Screening Not Applicable  Medication Review Press photographer) Complete  Some recent data might be hidden

## 2020-11-04 NOTE — ED Notes (Signed)
Pt resting calmly.

## 2020-11-04 NOTE — ED Notes (Signed)
Pt denies any needs at this time, repositioned bed for comfort pt states she will try to get some sleep. Call bell within reach

## 2020-11-04 NOTE — ED Notes (Signed)
Carmen [name], NT to bedside. She adjusted pt's bed and thermostat.

## 2020-11-04 NOTE — ED Notes (Signed)
Pt wants to wait until breakfast arrives to take Lasix and all other meds

## 2020-11-05 ENCOUNTER — Ambulatory Visit: Payer: Medicare HMO | Admitting: Family

## 2020-11-05 DIAGNOSIS — Q828 Other specified congenital malformations of skin: Secondary | ICD-10-CM | POA: Diagnosis not present

## 2020-11-05 DIAGNOSIS — M2550 Pain in unspecified joint: Secondary | ICD-10-CM | POA: Diagnosis not present

## 2020-11-05 DIAGNOSIS — R42 Dizziness and giddiness: Secondary | ICD-10-CM | POA: Diagnosis not present

## 2020-11-05 DIAGNOSIS — Z20822 Contact with and (suspected) exposure to covid-19: Secondary | ICD-10-CM | POA: Diagnosis not present

## 2020-11-05 DIAGNOSIS — J9621 Acute and chronic respiratory failure with hypoxia: Secondary | ICD-10-CM | POA: Diagnosis not present

## 2020-11-05 DIAGNOSIS — Z79899 Other long term (current) drug therapy: Secondary | ICD-10-CM | POA: Diagnosis not present

## 2020-11-05 DIAGNOSIS — R2689 Other abnormalities of gait and mobility: Secondary | ICD-10-CM | POA: Diagnosis not present

## 2020-11-05 DIAGNOSIS — N179 Acute kidney failure, unspecified: Secondary | ICD-10-CM | POA: Diagnosis not present

## 2020-11-05 DIAGNOSIS — I5033 Acute on chronic diastolic (congestive) heart failure: Secondary | ICD-10-CM | POA: Diagnosis not present

## 2020-11-05 DIAGNOSIS — I890 Lymphedema, not elsewhere classified: Secondary | ICD-10-CM | POA: Diagnosis not present

## 2020-11-05 DIAGNOSIS — J9602 Acute respiratory failure with hypercapnia: Secondary | ICD-10-CM | POA: Diagnosis not present

## 2020-11-05 DIAGNOSIS — F05 Delirium due to known physiological condition: Secondary | ICD-10-CM | POA: Diagnosis not present

## 2020-11-05 DIAGNOSIS — Z8542 Personal history of malignant neoplasm of other parts of uterus: Secondary | ICD-10-CM | POA: Diagnosis not present

## 2020-11-05 DIAGNOSIS — R279 Unspecified lack of coordination: Secondary | ICD-10-CM | POA: Diagnosis not present

## 2020-11-05 DIAGNOSIS — J439 Emphysema, unspecified: Secondary | ICD-10-CM | POA: Diagnosis not present

## 2020-11-05 DIAGNOSIS — K5909 Other constipation: Secondary | ICD-10-CM | POA: Diagnosis not present

## 2020-11-05 DIAGNOSIS — Z7409 Other reduced mobility: Secondary | ICD-10-CM | POA: Diagnosis not present

## 2020-11-05 DIAGNOSIS — I959 Hypotension, unspecified: Secondary | ICD-10-CM | POA: Diagnosis not present

## 2020-11-05 DIAGNOSIS — R5381 Other malaise: Secondary | ICD-10-CM | POA: Diagnosis not present

## 2020-11-05 DIAGNOSIS — M6281 Muscle weakness (generalized): Secondary | ICD-10-CM | POA: Diagnosis not present

## 2020-11-05 DIAGNOSIS — M170 Bilateral primary osteoarthritis of knee: Secondary | ICD-10-CM | POA: Diagnosis not present

## 2020-11-05 DIAGNOSIS — I4891 Unspecified atrial fibrillation: Secondary | ICD-10-CM | POA: Diagnosis not present

## 2020-11-05 DIAGNOSIS — M25569 Pain in unspecified knee: Secondary | ICD-10-CM | POA: Diagnosis not present

## 2020-11-05 DIAGNOSIS — Z7401 Bed confinement status: Secondary | ICD-10-CM | POA: Diagnosis not present

## 2020-11-05 DIAGNOSIS — I110 Hypertensive heart disease with heart failure: Secondary | ICD-10-CM | POA: Diagnosis not present

## 2020-11-05 DIAGNOSIS — J9601 Acute respiratory failure with hypoxia: Secondary | ICD-10-CM | POA: Diagnosis not present

## 2020-11-05 DIAGNOSIS — M25571 Pain in right ankle and joints of right foot: Secondary | ICD-10-CM | POA: Diagnosis not present

## 2020-11-05 DIAGNOSIS — I480 Paroxysmal atrial fibrillation: Secondary | ICD-10-CM | POA: Diagnosis not present

## 2020-11-05 DIAGNOSIS — I10 Essential (primary) hypertension: Secondary | ICD-10-CM | POA: Diagnosis not present

## 2020-11-05 DIAGNOSIS — M1710 Unilateral primary osteoarthritis, unspecified knee: Secondary | ICD-10-CM | POA: Diagnosis not present

## 2020-11-05 DIAGNOSIS — J9622 Acute and chronic respiratory failure with hypercapnia: Secondary | ICD-10-CM | POA: Diagnosis not present

## 2020-11-05 DIAGNOSIS — R262 Difficulty in walking, not elsewhere classified: Secondary | ICD-10-CM | POA: Diagnosis not present

## 2020-11-05 DIAGNOSIS — Z7901 Long term (current) use of anticoagulants: Secondary | ICD-10-CM | POA: Diagnosis not present

## 2020-11-05 DIAGNOSIS — I5032 Chronic diastolic (congestive) heart failure: Secondary | ICD-10-CM | POA: Diagnosis not present

## 2020-11-05 LAB — RESP PANEL BY RT-PCR (FLU A&B, COVID) ARPGX2
Influenza A by PCR: NEGATIVE
Influenza B by PCR: NEGATIVE
SARS Coronavirus 2 by RT PCR: NEGATIVE

## 2020-11-05 NOTE — ED Notes (Signed)
[facility]  called  for  transport to  [place]  [facility]

## 2020-11-05 NOTE — ED Notes (Signed)
To bedside to assist pt as call bell had been set off. Pt states not sure why it is going off; denies any needs currently.

## 2020-11-05 NOTE — ED Notes (Signed)
900 of urine emptied from canister.

## 2020-11-05 NOTE — ED Notes (Addendum)
Pt updated about plan to d/c to room 115 at [facility] after 2pm. Husband at bedside. Bed locked low. Rail up. Call bell within reach.

## 2020-11-05 NOTE — ED Notes (Signed)
Attempted report to [facility]. Will try again soon. Secretary arranging transport.

## 2020-11-05 NOTE — ED Notes (Signed)
Pt signed consent for transfer/transport. [facility] staff to bedside.

## 2020-11-05 NOTE — TOC Transition Note (Addendum)
Transition of Care [facility]) - CM/SW Discharge Note   Patient Details  Name: [name] MRN: [id number] Date of Birth: 1956-06-20  Transition [facility] Care [facility]) CM/SW Contact:  Ova [place] Phone Number: [phone] 11/05/2020, 11:27 AM   Clinical Narrative:     Patient will d/c to [facility] SNF room 115, report [id number], room will be ready after 2:00.  They will need AVS available as soon as possible. EDP/ED Staff notified. Doty,Michael (Spouse)  [phone] unable to leave a message due to issues with phone line.    Barriers to Discharge: Other (comment) (PT and OT evaluation.)   Patient Goals and CMS Choice Patient states their goals for this hospitalization and ongoing recovery are:: to go to short term rehab. CMS Medicare.gov Compare Post Acute Care list provided to:: Patient Choice offered to / list presented to : NA  Discharge Placement                       Discharge Plan and Services   Discharge Planning Services: CM Consult Post Acute Care Choice: [place]                               Social Determinants of Health (SDOH) Interventions     Readmission Risk Interventions Readmission Risk Prevention Plan 10/28/2020  Transportation Screening Complete  PCP or Specialist Appt within 3-5 Days Not Complete  Not Complete comments Pending discharge, will be scheduled by unit secretary.  [place] or Home Care Consult Complete  Social Work Consult for [place] Planning/Counseling Complete  Palliative Care Screening Not Applicable  Medication Review Press photographer) Complete  Some recent data might be hidden

## 2020-11-05 NOTE — ED Notes (Signed)
Pt asleep at this time

## 2020-11-05 NOTE — ED Notes (Signed)
Pt given soap, bath wipes, clothes, towels, and other toiletry items as requested. Husband remains at bedside. Pt denies any other needs currently.

## 2020-11-05 NOTE — ED Notes (Signed)
Pt given lunch tray. Pt watching tv. Husband remains at bedside. Pt assisted with bath as requested.

## 2020-11-05 NOTE — ED Provider Notes (Signed)
-----------------------------------------   5:40 AM on 11/05/2020 -----------------------------------------   Blood pressure (!) 132/54, pulse (!) 54, temperature 98.1 F (36.7 C), resp. rate 15, height 5\' 1"  (1.549 m), weight (!) 137 kg, SpO2 94 %.  The patient is resting at this time.  There have been no acute events since the last update.  Awaiting disposition plan from Social Work team.   Paulette Blanch, MD 11/05/20 [phone]

## 2020-11-05 NOTE — TOC Progression Note (Signed)
Transition of Care [facility]) - Progression Note    Patient Details  Name: CHRISTINEA BRIZUELA MRN: [id number] Date of Birth: 1956/09/08  Transition of Care [facility]) CM/SW [place], [place] Phone Number: [phone] 11/05/2020, 9:09 AM  Clinical Narrative:     CSW confirmed with [facility] SNF the  Patient has confirmed bed and can d/c today, pending COVID test results.  EDP/ ED Staff notified.  COVID test ordered.  Expected Discharge Plan: Skilled Nursing Facility Barriers to Discharge: Other (comment) (PT and OT evaluation.)  Expected Discharge Plan and Services Expected Discharge Plan: [place]   Discharge Planning Services: CM Consult Post Acute Care Choice: [place] Living arrangements for the past 2 months: Single Family Home                                       Social Determinants of Health (SDOH) Interventions    Readmission Risk Interventions Readmission Risk Prevention Plan 10/28/2020  Transportation Screening Complete  PCP or Specialist Appt within 3-5 Days Not Complete  Not Complete comments Pending discharge, will be scheduled by unit secretary.  [place] or Home Care Consult Complete  Social Work Consult for [place] Planning/Counseling Complete  Palliative Care Screening Not Applicable  Medication Review Press photographer) Complete  Some recent data might be hidden

## 2020-11-05 NOTE — ED Notes (Signed)
325 of urine emptied from new canister.

## 2020-11-08 DIAGNOSIS — F05 Delirium due to known physiological condition: Secondary | ICD-10-CM | POA: Diagnosis not present

## 2020-11-08 DIAGNOSIS — M6281 Muscle weakness (generalized): Secondary | ICD-10-CM | POA: Diagnosis not present

## 2020-11-08 DIAGNOSIS — K5909 Other constipation: Secondary | ICD-10-CM | POA: Diagnosis not present

## 2020-11-08 DIAGNOSIS — R5381 Other malaise: Secondary | ICD-10-CM | POA: Diagnosis not present

## 2020-11-09 ENCOUNTER — Encounter: Payer: Medicare HMO | Admitting: Occupational Therapy

## 2020-11-09 DIAGNOSIS — M25569 Pain in unspecified knee: Secondary | ICD-10-CM | POA: Diagnosis not present

## 2020-11-09 DIAGNOSIS — M25571 Pain in right ankle and joints of right foot: Secondary | ICD-10-CM | POA: Diagnosis not present

## 2020-11-09 DIAGNOSIS — M1710 Unilateral primary osteoarthritis, unspecified knee: Secondary | ICD-10-CM | POA: Diagnosis not present

## 2020-11-09 DIAGNOSIS — R262 Difficulty in walking, not elsewhere classified: Secondary | ICD-10-CM | POA: Diagnosis not present

## 2020-11-09 DIAGNOSIS — M6281 Muscle weakness (generalized): Secondary | ICD-10-CM | POA: Diagnosis not present

## 2020-11-09 DIAGNOSIS — I480 Paroxysmal atrial fibrillation: Secondary | ICD-10-CM | POA: Diagnosis not present

## 2020-11-09 DIAGNOSIS — I10 Essential (primary) hypertension: Secondary | ICD-10-CM | POA: Diagnosis not present

## 2020-11-10 ENCOUNTER — Encounter: Payer: Medicare HMO | Admitting: Occupational Therapy

## 2020-11-12 ENCOUNTER — Encounter: Payer: Medicare HMO | Admitting: Occupational Therapy

## 2020-11-15 ENCOUNTER — Inpatient Hospital Stay: Payer: Medicare HMO | Admitting: Internal Medicine

## 2020-11-15 NOTE — Progress Notes (Deleted)
Patient ID: Carmen [name], female    DOB: 01/12/1956, 64 y.o.   MRN: [id number]  HPI  Carmen [name] is a 64 y/o female with a history of endometrial cancer, HTN, obesity, lymphedema and chronic heart failure.   Echo report from 09/27/20 reviewed and showed an EF of 55-60% along with moderate LAE.   Was in the ED 11/01/20 due to weakness and multiple falls at home. VS and exam unremarkable. Released to SNF. Admitted 10/24/20 due to acute on chronic hypoxic hypercarbic respiratory failure secondary to pneumonia and severe sepsis.Patient also has CHF. Qualified for noninvasive ventilation at night. Cardiology and psychiatry consults (due to paranoia) obtained. Seroquel continued, patient refused haldol. Antibiotics given for sepsis. Initially placed on lasix drip with transition to oral diuretics. PT eval done. Discharged after 8 days with home health. Admitted 09/26/20 due to acute on chronic HF. Cardiology and wound care consults obtained. O2 requirement increased from 2L to 4L. Chest CTA negative for PE. Initially needed IV lasix with transition to oral diuretics with net loss of 19L. Elevated troponin thought to be due to demand ischemia. Discharged after 12 days.   She presents today for a follow-up visit with a chief complaint of   Past Medical History:  Diagnosis Date  . CHF (congestive heart failure) ([place])   . Endometrial cancer (HCC)    Grade 1  . Hypertension   . Lymphedema   . Morbid obesity ([place])   . Osteoarthritis of both knees    Past Surgical History:  Procedure Laterality Date  . ROBOTIC ASSISTED LAP VAGINAL HYSTERECTOMY  01/10/2011   BSO   No family history on file. Social History   Tobacco Use  . Smoking status: Never Smoker  . Smokeless tobacco: Never Used  Substance Use Topics  . Alcohol use: No   Allergies  Allergen Reactions  . Clarithromycin     REACTION: respiratory issues, can't breathe  . Penicillins     REACTION: can't breath     Review of Systems   Constitutional: Positive for fatigue (minimal). Negative for appetite change.  HENT: Positive for congestion and rhinorrhea. Negative for sore throat.   Eyes: Negative.   Respiratory: Positive for cough (at times). Negative for chest tightness and shortness of breath.   Cardiovascular: Positive for leg swelling. Negative for chest pain and palpitations.  Gastrointestinal: Negative for abdominal distention and abdominal pain.  Endocrine: Negative.   Genitourinary: Negative.   Musculoskeletal: Negative for back pain and neck pain.  Skin: Negative.   Allergic/Immunologic: Negative.   Neurological: Negative for dizziness and light-headedness.  Hematological: Negative for adenopathy. Does not bruise/bleed easily.  Psychiatric/Behavioral: Positive for sleep disturbance (wearing oxygen @ 2L). Negative for dysphoric mood. The patient is not nervous/anxious.     Physical Exam Vitals and nursing note reviewed.  Constitutional:      Appearance: Normal appearance.  HENT:     Head: Normocephalic and atraumatic.  Cardiovascular:     Rate and Rhythm: Normal rate and regular rhythm.  Pulmonary:     Effort: Pulmonary effort is normal.     Breath sounds: Normal breath sounds.  Abdominal:     General: There is no distension.     Palpations: Abdomen is soft.     Tenderness: There is no abdominal tenderness.  Musculoskeletal:        General: No tenderness.     Cervical back: Normal range of motion and neck supple.     Right lower leg: Edema (lower leg  wrapped) present.     Left lower leg: Edema (lower leg wrapped) present.  Skin:    General: Skin is warm and dry.  Neurological:     General: No focal deficit present.     Mental Status: She is alert and oriented to person, place, and time.  Psychiatric:        Mood and Affect: Mood normal.        Behavior: Behavior normal.        Thought Content: Thought content normal.    Assessment & Plan:  1: Chronic heart failure with preserved  ejection fraction with structural changes (LAE)- - NYHA II - euvolemic today although difficult to assess accurately due to body habitus - being weighed at [facility] although not daily; to begin weighing daily so that she can call for an overnight weight gain of > 2 pounds or a weekly weight gain of >5 pounds - 294 pounds from last visit here 1 month ago - not adding salt and said she hasn't used salt in "years"; had been reading food labels for sodium content although now she is eating whatever they bring her  - stated weight today was 294 - BNP 10/24/20 was 534.9 - has not gotten her flu vaccine yet  2: HTN- - BP - currently seeing PCP at facility; normally sees Carmen [name] - BMP 10/30/20 reviewed and showed sodium 144, potassium 4.3, creatinine 1.24 and GFR 48  3: Lymphedema- - both lower legs are currently wrapped in ACE wraps and she says that they get re-wrapped daily - trying to elevate her legs - discussed possible referral to lymphedema clinic once she gets home.   Facility medication list was reviewed.

## 2020-11-16 ENCOUNTER — Encounter: Payer: Medicare HMO | Admitting: Occupational Therapy

## 2020-11-16 ENCOUNTER — Ambulatory Visit: Payer: Medicare HMO | Admitting: Family

## 2020-11-16 DIAGNOSIS — M25569 Pain in unspecified knee: Secondary | ICD-10-CM | POA: Diagnosis not present

## 2020-11-16 DIAGNOSIS — M6281 Muscle weakness (generalized): Secondary | ICD-10-CM | POA: Diagnosis not present

## 2020-11-16 DIAGNOSIS — R262 Difficulty in walking, not elsewhere classified: Secondary | ICD-10-CM | POA: Diagnosis not present

## 2020-11-16 DIAGNOSIS — M25571 Pain in right ankle and joints of right foot: Secondary | ICD-10-CM | POA: Diagnosis not present

## 2020-11-22 DIAGNOSIS — Z7409 Other reduced mobility: Secondary | ICD-10-CM | POA: Diagnosis not present

## 2020-11-22 DIAGNOSIS — M6281 Muscle weakness (generalized): Secondary | ICD-10-CM | POA: Diagnosis not present

## 2020-11-22 DIAGNOSIS — R42 Dizziness and giddiness: Secondary | ICD-10-CM | POA: Diagnosis not present

## 2020-11-23 ENCOUNTER — Other Ambulatory Visit: Payer: Self-pay | Admitting: Internal Medicine

## 2020-11-24 DIAGNOSIS — Q828 Other specified congenital malformations of skin: Secondary | ICD-10-CM | POA: Diagnosis not present

## 2020-11-24 DIAGNOSIS — I890 Lymphedema, not elsewhere classified: Secondary | ICD-10-CM | POA: Diagnosis not present

## 2020-11-30 ENCOUNTER — Other Ambulatory Visit: Payer: Self-pay

## 2020-11-30 ENCOUNTER — Encounter: Payer: Medicare HMO | Admitting: Occupational Therapy

## 2020-11-30 NOTE — Patient Outreach (Signed)
[place] [facility]) Care Management  11/30/2020  Carmen [name] 1956/09/28 [id number]   Referral Date: 11/30/20 Referral Source: [employer] Report Date of Discharge: 11/26/20 Facility:  [place]: [facility]   Referral received.  No outreach warranted at this time.  Transition of Care calls being completed via EMMI. RN CM will outreach patient for any red flags received.    Plan: RN CM will close case.    [name], RN, MSN [facility] Care Management Care Management Coordinator Direct Line [phone] Toll Free: [phone]  Fax: [phone]

## 2020-12-01 ENCOUNTER — Encounter: Payer: Medicare HMO | Admitting: Occupational Therapy

## 2020-12-03 ENCOUNTER — Encounter: Payer: Medicare HMO | Admitting: Occupational Therapy

## 2020-12-05 DIAGNOSIS — D649 Anemia, unspecified: Secondary | ICD-10-CM | POA: Diagnosis not present

## 2020-12-05 DIAGNOSIS — J9611 Chronic respiratory failure with hypoxia: Secondary | ICD-10-CM | POA: Diagnosis not present

## 2020-12-05 DIAGNOSIS — I480 Paroxysmal atrial fibrillation: Secondary | ICD-10-CM | POA: Diagnosis not present

## 2020-12-05 DIAGNOSIS — J9612 Chronic respiratory failure with hypercapnia: Secondary | ICD-10-CM | POA: Diagnosis not present

## 2020-12-05 DIAGNOSIS — I890 Lymphedema, not elsewhere classified: Secondary | ICD-10-CM | POA: Diagnosis not present

## 2020-12-05 DIAGNOSIS — M170 Bilateral primary osteoarthritis of knee: Secondary | ICD-10-CM | POA: Diagnosis not present

## 2020-12-05 DIAGNOSIS — I5032 Chronic diastolic (congestive) heart failure: Secondary | ICD-10-CM | POA: Diagnosis not present

## 2020-12-05 DIAGNOSIS — I110 Hypertensive heart disease with heart failure: Secondary | ICD-10-CM | POA: Diagnosis not present

## 2020-12-06 ENCOUNTER — Other Ambulatory Visit: Payer: Self-pay | Admitting: Internal Medicine

## 2020-12-07 ENCOUNTER — Other Ambulatory Visit: Payer: Self-pay | Admitting: Internal Medicine

## 2020-12-07 ENCOUNTER — Encounter: Payer: Medicare HMO | Admitting: Occupational Therapy

## 2020-12-07 DIAGNOSIS — I5032 Chronic diastolic (congestive) heart failure: Secondary | ICD-10-CM | POA: Diagnosis not present

## 2020-12-07 DIAGNOSIS — I890 Lymphedema, not elsewhere classified: Secondary | ICD-10-CM | POA: Diagnosis not present

## 2020-12-07 DIAGNOSIS — J9611 Chronic respiratory failure with hypoxia: Secondary | ICD-10-CM | POA: Diagnosis not present

## 2020-12-07 DIAGNOSIS — J9612 Chronic respiratory failure with hypercapnia: Secondary | ICD-10-CM | POA: Diagnosis not present

## 2020-12-07 DIAGNOSIS — I110 Hypertensive heart disease with heart failure: Secondary | ICD-10-CM | POA: Diagnosis not present

## 2020-12-07 DIAGNOSIS — D649 Anemia, unspecified: Secondary | ICD-10-CM | POA: Diagnosis not present

## 2020-12-07 DIAGNOSIS — M170 Bilateral primary osteoarthritis of knee: Secondary | ICD-10-CM | POA: Diagnosis not present

## 2020-12-07 DIAGNOSIS — I480 Paroxysmal atrial fibrillation: Secondary | ICD-10-CM | POA: Diagnosis not present

## 2020-12-08 ENCOUNTER — Encounter: Payer: Medicare HMO | Admitting: Occupational Therapy

## 2020-12-10 ENCOUNTER — Encounter: Payer: Medicare HMO | Admitting: Occupational Therapy

## 2020-12-10 ENCOUNTER — Telehealth: Payer: Self-pay | Admitting: Family

## 2020-12-10 DIAGNOSIS — D649 Anemia, unspecified: Secondary | ICD-10-CM | POA: Diagnosis not present

## 2020-12-10 DIAGNOSIS — I890 Lymphedema, not elsewhere classified: Secondary | ICD-10-CM | POA: Diagnosis not present

## 2020-12-10 DIAGNOSIS — I110 Hypertensive heart disease with heart failure: Secondary | ICD-10-CM | POA: Diagnosis not present

## 2020-12-10 DIAGNOSIS — I5032 Chronic diastolic (congestive) heart failure: Secondary | ICD-10-CM | POA: Diagnosis not present

## 2020-12-10 DIAGNOSIS — J9612 Chronic respiratory failure with hypercapnia: Secondary | ICD-10-CM | POA: Diagnosis not present

## 2020-12-10 DIAGNOSIS — I480 Paroxysmal atrial fibrillation: Secondary | ICD-10-CM | POA: Diagnosis not present

## 2020-12-10 DIAGNOSIS — J9611 Chronic respiratory failure with hypoxia: Secondary | ICD-10-CM | POA: Diagnosis not present

## 2020-12-10 DIAGNOSIS — M170 Bilateral primary osteoarthritis of knee: Secondary | ICD-10-CM | POA: Diagnosis not present

## 2020-12-10 NOTE — Telephone Encounter (Signed)
Unable to reach patient as phone is disconnected in attempt to reschedule a no show appointment with the [facility].   Carmen [name], NT

## 2020-12-14 ENCOUNTER — Other Ambulatory Visit: Payer: Self-pay | Admitting: *Deleted

## 2020-12-14 ENCOUNTER — Encounter: Payer: Medicare HMO | Admitting: Occupational Therapy

## 2020-12-14 ENCOUNTER — Other Ambulatory Visit: Payer: Self-pay | Admitting: Internal Medicine

## 2020-12-14 DIAGNOSIS — J9612 Chronic respiratory failure with hypercapnia: Secondary | ICD-10-CM | POA: Diagnosis not present

## 2020-12-14 DIAGNOSIS — J9611 Chronic respiratory failure with hypoxia: Secondary | ICD-10-CM | POA: Diagnosis not present

## 2020-12-14 DIAGNOSIS — I5032 Chronic diastolic (congestive) heart failure: Secondary | ICD-10-CM | POA: Diagnosis not present

## 2020-12-14 DIAGNOSIS — I480 Paroxysmal atrial fibrillation: Secondary | ICD-10-CM | POA: Diagnosis not present

## 2020-12-14 DIAGNOSIS — M170 Bilateral primary osteoarthritis of knee: Secondary | ICD-10-CM | POA: Diagnosis not present

## 2020-12-14 DIAGNOSIS — D649 Anemia, unspecified: Secondary | ICD-10-CM | POA: Diagnosis not present

## 2020-12-14 DIAGNOSIS — I110 Hypertensive heart disease with heart failure: Secondary | ICD-10-CM | POA: Diagnosis not present

## 2020-12-14 DIAGNOSIS — I890 Lymphedema, not elsewhere classified: Secondary | ICD-10-CM | POA: Diagnosis not present

## 2020-12-14 MED ORDER — DILTIAZEM HCL ER COATED BEADS 240 MG PO CP24
240.0000 mg | ORAL_CAPSULE | Freq: Every day | ORAL | 3 refills | Status: DC
Start: 2020-12-14 — End: 2021-11-07

## 2020-12-14 MED ORDER — ENALAPRIL MALEATE 5 MG PO TABS: 5.0000 mg | ORAL_TABLET | Freq: Every day | ORAL | 3 refills | Status: AC

## 2020-12-15 ENCOUNTER — Encounter: Payer: Medicare HMO | Admitting: Occupational Therapy

## 2020-12-16 DIAGNOSIS — I5032 Chronic diastolic (congestive) heart failure: Secondary | ICD-10-CM | POA: Diagnosis not present

## 2020-12-16 DIAGNOSIS — J9611 Chronic respiratory failure with hypoxia: Secondary | ICD-10-CM | POA: Diagnosis not present

## 2020-12-16 DIAGNOSIS — D649 Anemia, unspecified: Secondary | ICD-10-CM | POA: Diagnosis not present

## 2020-12-16 DIAGNOSIS — M170 Bilateral primary osteoarthritis of knee: Secondary | ICD-10-CM | POA: Diagnosis not present

## 2020-12-16 DIAGNOSIS — I110 Hypertensive heart disease with heart failure: Secondary | ICD-10-CM | POA: Diagnosis not present

## 2020-12-16 DIAGNOSIS — J9612 Chronic respiratory failure with hypercapnia: Secondary | ICD-10-CM | POA: Diagnosis not present

## 2020-12-16 DIAGNOSIS — I890 Lymphedema, not elsewhere classified: Secondary | ICD-10-CM | POA: Diagnosis not present

## 2020-12-16 DIAGNOSIS — I480 Paroxysmal atrial fibrillation: Secondary | ICD-10-CM | POA: Diagnosis not present

## 2020-12-17 ENCOUNTER — Encounter: Payer: Medicare HMO | Admitting: Occupational Therapy

## 2020-12-20 DIAGNOSIS — J9611 Chronic respiratory failure with hypoxia: Secondary | ICD-10-CM | POA: Diagnosis not present

## 2020-12-20 DIAGNOSIS — M170 Bilateral primary osteoarthritis of knee: Secondary | ICD-10-CM | POA: Diagnosis not present

## 2020-12-20 DIAGNOSIS — I890 Lymphedema, not elsewhere classified: Secondary | ICD-10-CM | POA: Diagnosis not present

## 2020-12-20 DIAGNOSIS — I110 Hypertensive heart disease with heart failure: Secondary | ICD-10-CM | POA: Diagnosis not present

## 2020-12-20 DIAGNOSIS — I480 Paroxysmal atrial fibrillation: Secondary | ICD-10-CM | POA: Diagnosis not present

## 2020-12-20 DIAGNOSIS — D649 Anemia, unspecified: Secondary | ICD-10-CM | POA: Diagnosis not present

## 2020-12-20 DIAGNOSIS — I5032 Chronic diastolic (congestive) heart failure: Secondary | ICD-10-CM | POA: Diagnosis not present

## 2020-12-20 DIAGNOSIS — J9612 Chronic respiratory failure with hypercapnia: Secondary | ICD-10-CM | POA: Diagnosis not present

## 2020-12-21 ENCOUNTER — Encounter: Payer: Medicare HMO | Admitting: Occupational Therapy

## 2020-12-21 DIAGNOSIS — I110 Hypertensive heart disease with heart failure: Secondary | ICD-10-CM | POA: Diagnosis not present

## 2020-12-21 DIAGNOSIS — I5032 Chronic diastolic (congestive) heart failure: Secondary | ICD-10-CM | POA: Diagnosis not present

## 2020-12-21 DIAGNOSIS — D649 Anemia, unspecified: Secondary | ICD-10-CM | POA: Diagnosis not present

## 2020-12-21 DIAGNOSIS — I480 Paroxysmal atrial fibrillation: Secondary | ICD-10-CM | POA: Diagnosis not present

## 2020-12-21 DIAGNOSIS — I890 Lymphedema, not elsewhere classified: Secondary | ICD-10-CM | POA: Diagnosis not present

## 2020-12-21 DIAGNOSIS — J9611 Chronic respiratory failure with hypoxia: Secondary | ICD-10-CM | POA: Diagnosis not present

## 2020-12-21 DIAGNOSIS — J9612 Chronic respiratory failure with hypercapnia: Secondary | ICD-10-CM | POA: Diagnosis not present

## 2020-12-21 DIAGNOSIS — M170 Bilateral primary osteoarthritis of knee: Secondary | ICD-10-CM | POA: Diagnosis not present

## 2020-12-22 ENCOUNTER — Encounter: Payer: Medicare HMO | Admitting: Occupational Therapy

## 2020-12-24 ENCOUNTER — Encounter: Payer: Medicare HMO | Admitting: Occupational Therapy

## 2020-12-27 DIAGNOSIS — M6281 Muscle weakness (generalized): Secondary | ICD-10-CM | POA: Diagnosis not present

## 2020-12-27 DIAGNOSIS — I5032 Chronic diastolic (congestive) heart failure: Secondary | ICD-10-CM | POA: Diagnosis not present

## 2020-12-28 ENCOUNTER — Encounter: Payer: Medicare HMO | Admitting: Occupational Therapy

## 2020-12-28 DIAGNOSIS — I110 Hypertensive heart disease with heart failure: Secondary | ICD-10-CM | POA: Diagnosis not present

## 2020-12-28 DIAGNOSIS — D649 Anemia, unspecified: Secondary | ICD-10-CM | POA: Diagnosis not present

## 2020-12-28 DIAGNOSIS — I480 Paroxysmal atrial fibrillation: Secondary | ICD-10-CM | POA: Diagnosis not present

## 2020-12-28 DIAGNOSIS — J9612 Chronic respiratory failure with hypercapnia: Secondary | ICD-10-CM | POA: Diagnosis not present

## 2020-12-28 DIAGNOSIS — M170 Bilateral primary osteoarthritis of knee: Secondary | ICD-10-CM | POA: Diagnosis not present

## 2020-12-28 DIAGNOSIS — I890 Lymphedema, not elsewhere classified: Secondary | ICD-10-CM | POA: Diagnosis not present

## 2020-12-28 DIAGNOSIS — J9611 Chronic respiratory failure with hypoxia: Secondary | ICD-10-CM | POA: Diagnosis not present

## 2020-12-28 DIAGNOSIS — I5032 Chronic diastolic (congestive) heart failure: Secondary | ICD-10-CM | POA: Diagnosis not present

## 2020-12-29 ENCOUNTER — Encounter: Payer: Medicare HMO | Admitting: Occupational Therapy

## 2020-12-29 DIAGNOSIS — D649 Anemia, unspecified: Secondary | ICD-10-CM | POA: Diagnosis not present

## 2020-12-29 DIAGNOSIS — I110 Hypertensive heart disease with heart failure: Secondary | ICD-10-CM | POA: Diagnosis not present

## 2020-12-29 DIAGNOSIS — J9611 Chronic respiratory failure with hypoxia: Secondary | ICD-10-CM | POA: Diagnosis not present

## 2020-12-29 DIAGNOSIS — J9612 Chronic respiratory failure with hypercapnia: Secondary | ICD-10-CM | POA: Diagnosis not present

## 2020-12-29 DIAGNOSIS — I890 Lymphedema, not elsewhere classified: Secondary | ICD-10-CM | POA: Diagnosis not present

## 2020-12-29 DIAGNOSIS — M170 Bilateral primary osteoarthritis of knee: Secondary | ICD-10-CM | POA: Diagnosis not present

## 2020-12-29 DIAGNOSIS — I480 Paroxysmal atrial fibrillation: Secondary | ICD-10-CM | POA: Diagnosis not present

## 2020-12-29 DIAGNOSIS — I5032 Chronic diastolic (congestive) heart failure: Secondary | ICD-10-CM | POA: Diagnosis not present

## 2020-12-31 ENCOUNTER — Encounter: Payer: Medicare HMO | Admitting: Occupational Therapy

## 2021-01-04 ENCOUNTER — Encounter: Payer: Medicare HMO | Admitting: Occupational Therapy

## 2021-01-04 DIAGNOSIS — I480 Paroxysmal atrial fibrillation: Secondary | ICD-10-CM | POA: Diagnosis not present

## 2021-01-04 DIAGNOSIS — I110 Hypertensive heart disease with heart failure: Secondary | ICD-10-CM | POA: Diagnosis not present

## 2021-01-04 DIAGNOSIS — J9611 Chronic respiratory failure with hypoxia: Secondary | ICD-10-CM | POA: Diagnosis not present

## 2021-01-04 DIAGNOSIS — I5032 Chronic diastolic (congestive) heart failure: Secondary | ICD-10-CM | POA: Diagnosis not present

## 2021-01-04 DIAGNOSIS — D649 Anemia, unspecified: Secondary | ICD-10-CM | POA: Diagnosis not present

## 2021-01-04 DIAGNOSIS — I890 Lymphedema, not elsewhere classified: Secondary | ICD-10-CM | POA: Diagnosis not present

## 2021-01-04 DIAGNOSIS — J9612 Chronic respiratory failure with hypercapnia: Secondary | ICD-10-CM | POA: Diagnosis not present

## 2021-01-04 DIAGNOSIS — M170 Bilateral primary osteoarthritis of knee: Secondary | ICD-10-CM | POA: Diagnosis not present

## 2021-01-06 ENCOUNTER — Other Ambulatory Visit: Payer: Self-pay | Admitting: Internal Medicine

## 2021-01-06 DIAGNOSIS — I5032 Chronic diastolic (congestive) heart failure: Secondary | ICD-10-CM | POA: Diagnosis not present

## 2021-01-06 DIAGNOSIS — M170 Bilateral primary osteoarthritis of knee: Secondary | ICD-10-CM | POA: Diagnosis not present

## 2021-01-06 DIAGNOSIS — I110 Hypertensive heart disease with heart failure: Secondary | ICD-10-CM | POA: Diagnosis not present

## 2021-01-06 DIAGNOSIS — D649 Anemia, unspecified: Secondary | ICD-10-CM | POA: Diagnosis not present

## 2021-01-06 DIAGNOSIS — J9612 Chronic respiratory failure with hypercapnia: Secondary | ICD-10-CM | POA: Diagnosis not present

## 2021-01-06 DIAGNOSIS — J9611 Chronic respiratory failure with hypoxia: Secondary | ICD-10-CM | POA: Diagnosis not present

## 2021-01-06 DIAGNOSIS — I480 Paroxysmal atrial fibrillation: Secondary | ICD-10-CM | POA: Diagnosis not present

## 2021-01-06 DIAGNOSIS — I890 Lymphedema, not elsewhere classified: Secondary | ICD-10-CM | POA: Diagnosis not present

## 2021-01-12 DIAGNOSIS — D649 Anemia, unspecified: Secondary | ICD-10-CM | POA: Diagnosis not present

## 2021-01-12 DIAGNOSIS — I110 Hypertensive heart disease with heart failure: Secondary | ICD-10-CM | POA: Diagnosis not present

## 2021-01-12 DIAGNOSIS — M170 Bilateral primary osteoarthritis of knee: Secondary | ICD-10-CM | POA: Diagnosis not present

## 2021-01-12 DIAGNOSIS — I5032 Chronic diastolic (congestive) heart failure: Secondary | ICD-10-CM | POA: Diagnosis not present

## 2021-01-12 DIAGNOSIS — I890 Lymphedema, not elsewhere classified: Secondary | ICD-10-CM | POA: Diagnosis not present

## 2021-01-12 DIAGNOSIS — I480 Paroxysmal atrial fibrillation: Secondary | ICD-10-CM | POA: Diagnosis not present

## 2021-01-12 DIAGNOSIS — J9611 Chronic respiratory failure with hypoxia: Secondary | ICD-10-CM | POA: Diagnosis not present

## 2021-01-12 DIAGNOSIS — J9612 Chronic respiratory failure with hypercapnia: Secondary | ICD-10-CM | POA: Diagnosis not present

## 2021-01-13 DIAGNOSIS — J9611 Chronic respiratory failure with hypoxia: Secondary | ICD-10-CM | POA: Diagnosis not present

## 2021-01-13 DIAGNOSIS — D649 Anemia, unspecified: Secondary | ICD-10-CM | POA: Diagnosis not present

## 2021-01-13 DIAGNOSIS — I5032 Chronic diastolic (congestive) heart failure: Secondary | ICD-10-CM | POA: Diagnosis not present

## 2021-01-13 DIAGNOSIS — I110 Hypertensive heart disease with heart failure: Secondary | ICD-10-CM | POA: Diagnosis not present

## 2021-01-13 DIAGNOSIS — I890 Lymphedema, not elsewhere classified: Secondary | ICD-10-CM | POA: Diagnosis not present

## 2021-01-13 DIAGNOSIS — I480 Paroxysmal atrial fibrillation: Secondary | ICD-10-CM | POA: Diagnosis not present

## 2021-01-13 DIAGNOSIS — M170 Bilateral primary osteoarthritis of knee: Secondary | ICD-10-CM | POA: Diagnosis not present

## 2021-01-13 DIAGNOSIS — J9612 Chronic respiratory failure with hypercapnia: Secondary | ICD-10-CM | POA: Diagnosis not present

## 2021-01-17 DIAGNOSIS — I5032 Chronic diastolic (congestive) heart failure: Secondary | ICD-10-CM | POA: Diagnosis not present

## 2021-01-17 DIAGNOSIS — M170 Bilateral primary osteoarthritis of knee: Secondary | ICD-10-CM | POA: Diagnosis not present

## 2021-01-17 DIAGNOSIS — J9612 Chronic respiratory failure with hypercapnia: Secondary | ICD-10-CM | POA: Diagnosis not present

## 2021-01-17 DIAGNOSIS — I890 Lymphedema, not elsewhere classified: Secondary | ICD-10-CM | POA: Diagnosis not present

## 2021-01-17 DIAGNOSIS — I480 Paroxysmal atrial fibrillation: Secondary | ICD-10-CM | POA: Diagnosis not present

## 2021-01-17 DIAGNOSIS — D649 Anemia, unspecified: Secondary | ICD-10-CM | POA: Diagnosis not present

## 2021-01-17 DIAGNOSIS — I110 Hypertensive heart disease with heart failure: Secondary | ICD-10-CM | POA: Diagnosis not present

## 2021-01-17 DIAGNOSIS — J9611 Chronic respiratory failure with hypoxia: Secondary | ICD-10-CM | POA: Diagnosis not present

## 2021-01-21 DIAGNOSIS — I480 Paroxysmal atrial fibrillation: Secondary | ICD-10-CM | POA: Diagnosis not present

## 2021-01-21 DIAGNOSIS — J9612 Chronic respiratory failure with hypercapnia: Secondary | ICD-10-CM | POA: Diagnosis not present

## 2021-01-21 DIAGNOSIS — I5032 Chronic diastolic (congestive) heart failure: Secondary | ICD-10-CM | POA: Diagnosis not present

## 2021-01-21 DIAGNOSIS — M170 Bilateral primary osteoarthritis of knee: Secondary | ICD-10-CM | POA: Diagnosis not present

## 2021-01-21 DIAGNOSIS — J9611 Chronic respiratory failure with hypoxia: Secondary | ICD-10-CM | POA: Diagnosis not present

## 2021-01-21 DIAGNOSIS — D649 Anemia, unspecified: Secondary | ICD-10-CM | POA: Diagnosis not present

## 2021-01-21 DIAGNOSIS — I890 Lymphedema, not elsewhere classified: Secondary | ICD-10-CM | POA: Diagnosis not present

## 2021-01-21 DIAGNOSIS — I110 Hypertensive heart disease with heart failure: Secondary | ICD-10-CM | POA: Diagnosis not present

## 2021-01-24 DIAGNOSIS — M170 Bilateral primary osteoarthritis of knee: Secondary | ICD-10-CM | POA: Diagnosis not present

## 2021-01-24 DIAGNOSIS — I110 Hypertensive heart disease with heart failure: Secondary | ICD-10-CM | POA: Diagnosis not present

## 2021-01-24 DIAGNOSIS — J9611 Chronic respiratory failure with hypoxia: Secondary | ICD-10-CM | POA: Diagnosis not present

## 2021-01-24 DIAGNOSIS — I480 Paroxysmal atrial fibrillation: Secondary | ICD-10-CM | POA: Diagnosis not present

## 2021-01-24 DIAGNOSIS — I5032 Chronic diastolic (congestive) heart failure: Secondary | ICD-10-CM | POA: Diagnosis not present

## 2021-01-24 DIAGNOSIS — M6281 Muscle weakness (generalized): Secondary | ICD-10-CM | POA: Diagnosis not present

## 2021-01-24 DIAGNOSIS — D649 Anemia, unspecified: Secondary | ICD-10-CM | POA: Diagnosis not present

## 2021-01-24 DIAGNOSIS — J9612 Chronic respiratory failure with hypercapnia: Secondary | ICD-10-CM | POA: Diagnosis not present

## 2021-01-24 DIAGNOSIS — I890 Lymphedema, not elsewhere classified: Secondary | ICD-10-CM | POA: Diagnosis not present

## 2021-01-25 ENCOUNTER — Other Ambulatory Visit: Payer: Self-pay | Admitting: Internal Medicine

## 2021-01-25 DIAGNOSIS — I480 Paroxysmal atrial fibrillation: Secondary | ICD-10-CM | POA: Diagnosis not present

## 2021-01-25 DIAGNOSIS — D649 Anemia, unspecified: Secondary | ICD-10-CM | POA: Diagnosis not present

## 2021-01-25 DIAGNOSIS — J9612 Chronic respiratory failure with hypercapnia: Secondary | ICD-10-CM | POA: Diagnosis not present

## 2021-01-25 DIAGNOSIS — M170 Bilateral primary osteoarthritis of knee: Secondary | ICD-10-CM | POA: Diagnosis not present

## 2021-01-25 DIAGNOSIS — I5032 Chronic diastolic (congestive) heart failure: Secondary | ICD-10-CM | POA: Diagnosis not present

## 2021-01-25 DIAGNOSIS — I890 Lymphedema, not elsewhere classified: Secondary | ICD-10-CM | POA: Diagnosis not present

## 2021-01-25 DIAGNOSIS — I110 Hypertensive heart disease with heart failure: Secondary | ICD-10-CM | POA: Diagnosis not present

## 2021-01-25 DIAGNOSIS — J9611 Chronic respiratory failure with hypoxia: Secondary | ICD-10-CM | POA: Diagnosis not present

## 2021-01-26 DIAGNOSIS — J9611 Chronic respiratory failure with hypoxia: Secondary | ICD-10-CM | POA: Diagnosis not present

## 2021-01-26 DIAGNOSIS — I110 Hypertensive heart disease with heart failure: Secondary | ICD-10-CM | POA: Diagnosis not present

## 2021-01-26 DIAGNOSIS — M170 Bilateral primary osteoarthritis of knee: Secondary | ICD-10-CM | POA: Diagnosis not present

## 2021-01-26 DIAGNOSIS — I480 Paroxysmal atrial fibrillation: Secondary | ICD-10-CM | POA: Diagnosis not present

## 2021-01-26 DIAGNOSIS — J9612 Chronic respiratory failure with hypercapnia: Secondary | ICD-10-CM | POA: Diagnosis not present

## 2021-01-26 DIAGNOSIS — I5032 Chronic diastolic (congestive) heart failure: Secondary | ICD-10-CM | POA: Diagnosis not present

## 2021-01-26 DIAGNOSIS — I890 Lymphedema, not elsewhere classified: Secondary | ICD-10-CM | POA: Diagnosis not present

## 2021-01-26 DIAGNOSIS — D649 Anemia, unspecified: Secondary | ICD-10-CM | POA: Diagnosis not present

## 2021-01-27 ENCOUNTER — Other Ambulatory Visit: Payer: Self-pay | Admitting: Internal Medicine

## 2021-01-28 DIAGNOSIS — J9611 Chronic respiratory failure with hypoxia: Secondary | ICD-10-CM | POA: Diagnosis not present

## 2021-01-28 DIAGNOSIS — I480 Paroxysmal atrial fibrillation: Secondary | ICD-10-CM | POA: Diagnosis not present

## 2021-01-28 DIAGNOSIS — I890 Lymphedema, not elsewhere classified: Secondary | ICD-10-CM | POA: Diagnosis not present

## 2021-01-28 DIAGNOSIS — I110 Hypertensive heart disease with heart failure: Secondary | ICD-10-CM | POA: Diagnosis not present

## 2021-01-28 DIAGNOSIS — M170 Bilateral primary osteoarthritis of knee: Secondary | ICD-10-CM | POA: Diagnosis not present

## 2021-01-28 DIAGNOSIS — J9612 Chronic respiratory failure with hypercapnia: Secondary | ICD-10-CM | POA: Diagnosis not present

## 2021-01-28 DIAGNOSIS — I5032 Chronic diastolic (congestive) heart failure: Secondary | ICD-10-CM | POA: Diagnosis not present

## 2021-01-28 DIAGNOSIS — D649 Anemia, unspecified: Secondary | ICD-10-CM | POA: Diagnosis not present

## 2021-02-01 ENCOUNTER — Other Ambulatory Visit: Payer: Self-pay | Admitting: Family Medicine

## 2021-02-02 DIAGNOSIS — M170 Bilateral primary osteoarthritis of knee: Secondary | ICD-10-CM | POA: Diagnosis not present

## 2021-02-02 DIAGNOSIS — J9612 Chronic respiratory failure with hypercapnia: Secondary | ICD-10-CM | POA: Diagnosis not present

## 2021-02-02 DIAGNOSIS — J9611 Chronic respiratory failure with hypoxia: Secondary | ICD-10-CM | POA: Diagnosis not present

## 2021-02-02 DIAGNOSIS — D649 Anemia, unspecified: Secondary | ICD-10-CM | POA: Diagnosis not present

## 2021-02-02 DIAGNOSIS — I890 Lymphedema, not elsewhere classified: Secondary | ICD-10-CM | POA: Diagnosis not present

## 2021-02-02 DIAGNOSIS — I480 Paroxysmal atrial fibrillation: Secondary | ICD-10-CM | POA: Diagnosis not present

## 2021-02-02 DIAGNOSIS — I110 Hypertensive heart disease with heart failure: Secondary | ICD-10-CM | POA: Diagnosis not present

## 2021-02-02 DIAGNOSIS — I5032 Chronic diastolic (congestive) heart failure: Secondary | ICD-10-CM | POA: Diagnosis not present

## 2021-02-18 DIAGNOSIS — J9611 Chronic respiratory failure with hypoxia: Secondary | ICD-10-CM | POA: Diagnosis not present

## 2021-02-18 DIAGNOSIS — L89312 Pressure ulcer of right buttock, stage 2: Secondary | ICD-10-CM | POA: Diagnosis not present

## 2021-02-18 DIAGNOSIS — L89322 Pressure ulcer of left buttock, stage 2: Secondary | ICD-10-CM | POA: Diagnosis not present

## 2021-02-18 DIAGNOSIS — I110 Hypertensive heart disease with heart failure: Secondary | ICD-10-CM | POA: Diagnosis not present

## 2021-02-18 DIAGNOSIS — D649 Anemia, unspecified: Secondary | ICD-10-CM | POA: Diagnosis not present

## 2021-02-18 DIAGNOSIS — J9612 Chronic respiratory failure with hypercapnia: Secondary | ICD-10-CM | POA: Diagnosis not present

## 2021-02-18 DIAGNOSIS — I5032 Chronic diastolic (congestive) heart failure: Secondary | ICD-10-CM | POA: Diagnosis not present

## 2021-02-18 DIAGNOSIS — I480 Paroxysmal atrial fibrillation: Secondary | ICD-10-CM | POA: Diagnosis not present

## 2021-02-21 DIAGNOSIS — I480 Paroxysmal atrial fibrillation: Secondary | ICD-10-CM | POA: Diagnosis not present

## 2021-02-21 DIAGNOSIS — D649 Anemia, unspecified: Secondary | ICD-10-CM | POA: Diagnosis not present

## 2021-02-21 DIAGNOSIS — L89322 Pressure ulcer of left buttock, stage 2: Secondary | ICD-10-CM | POA: Diagnosis not present

## 2021-02-21 DIAGNOSIS — I5032 Chronic diastolic (congestive) heart failure: Secondary | ICD-10-CM | POA: Diagnosis not present

## 2021-02-21 DIAGNOSIS — L89312 Pressure ulcer of right buttock, stage 2: Secondary | ICD-10-CM | POA: Diagnosis not present

## 2021-02-21 DIAGNOSIS — J9611 Chronic respiratory failure with hypoxia: Secondary | ICD-10-CM | POA: Diagnosis not present

## 2021-02-21 DIAGNOSIS — J9612 Chronic respiratory failure with hypercapnia: Secondary | ICD-10-CM | POA: Diagnosis not present

## 2021-02-21 DIAGNOSIS — I110 Hypertensive heart disease with heart failure: Secondary | ICD-10-CM | POA: Diagnosis not present

## 2021-02-22 ENCOUNTER — Other Ambulatory Visit: Payer: Self-pay | Admitting: Family Medicine

## 2021-02-22 DIAGNOSIS — J9612 Chronic respiratory failure with hypercapnia: Secondary | ICD-10-CM | POA: Diagnosis not present

## 2021-02-22 DIAGNOSIS — L89312 Pressure ulcer of right buttock, stage 2: Secondary | ICD-10-CM | POA: Diagnosis not present

## 2021-02-22 DIAGNOSIS — J9611 Chronic respiratory failure with hypoxia: Secondary | ICD-10-CM | POA: Diagnosis not present

## 2021-02-22 DIAGNOSIS — I5032 Chronic diastolic (congestive) heart failure: Secondary | ICD-10-CM | POA: Diagnosis not present

## 2021-02-22 DIAGNOSIS — I110 Hypertensive heart disease with heart failure: Secondary | ICD-10-CM | POA: Diagnosis not present

## 2021-02-22 DIAGNOSIS — D649 Anemia, unspecified: Secondary | ICD-10-CM | POA: Diagnosis not present

## 2021-02-22 DIAGNOSIS — I480 Paroxysmal atrial fibrillation: Secondary | ICD-10-CM | POA: Diagnosis not present

## 2021-02-22 DIAGNOSIS — L89322 Pressure ulcer of left buttock, stage 2: Secondary | ICD-10-CM | POA: Diagnosis not present

## 2021-02-24 DIAGNOSIS — M6281 Muscle weakness (generalized): Secondary | ICD-10-CM | POA: Diagnosis not present

## 2021-02-24 DIAGNOSIS — I5032 Chronic diastolic (congestive) heart failure: Secondary | ICD-10-CM | POA: Diagnosis not present

## 2021-03-10 DIAGNOSIS — R0902 Hypoxemia: Secondary | ICD-10-CM | POA: Diagnosis not present

## 2021-03-17 ENCOUNTER — Other Ambulatory Visit: Payer: Self-pay | Admitting: Family Medicine

## 2021-03-26 DIAGNOSIS — M6281 Muscle weakness (generalized): Secondary | ICD-10-CM | POA: Diagnosis not present

## 2021-03-26 DIAGNOSIS — I5032 Chronic diastolic (congestive) heart failure: Secondary | ICD-10-CM | POA: Diagnosis not present

## 2021-03-29 ENCOUNTER — Other Ambulatory Visit: Payer: Self-pay

## 2021-03-29 NOTE — Patient Outreach (Addendum)
[place] [facility]) Care Management  03/29/2021  BEULA JOYNER 06-May-1956 [id number]   Referral Date: 03/29/21 Referral Source: Nurseline Referral Reason: Out of  Medication but unable to go to office for appointment.   Outreach Attempt: Telephone call to patient for follow up nurseline call. No answer.  HIPAA compliant voice message left.   Plan: RN CM will attempt patient again within 4 business days and send a letter.  [name], RN, MSN [facility] Care Management Care Management Coordinator Direct Line [phone] Toll Free: [phone]  Fax: [phone]

## 2021-03-30 ENCOUNTER — Other Ambulatory Visit: Payer: Self-pay

## 2021-03-30 NOTE — Patient Outreach (Signed)
[place] [facility]) Care Management  03/30/2021  Carmen [name] [birth year] [id number]   Referral Date: 03/29/21 Referral Source: Nurseline Referral Reason: Out of  Medication but unable to go to office for appointment.   Outreach Attempt: Telephone call to patient for follow up nurseline call. No answer.  HIPAA compliant voice message left.   Plan: RN CM will attempt patient again within 4 business days.  [name], RN, MSN [place] Management Care Management Coordinator Direct Line [phone] Cell [phone] Toll Free: [phone]  Fax: [phone]

## 2021-03-31 ENCOUNTER — Other Ambulatory Visit: Payer: Self-pay

## 2021-03-31 NOTE — Patient Outreach (Signed)
[place] [facility]) Care Management  03/31/2021  GABRIELLAH RABEL 1956/05/02 [id number]    Referral Date:03/29/21 Referral Source:Nurseline Referral Reason:Out of Medication but unable to go to office for appointment.   Outreach Attempt:Telephone call to patient for follow up nurseline call. No answer. HIPAA compliant voice message left.   Plan:RN CM will attempt patient again in 3 weeks.  [name], RN, MSN [place] Management Care Management Coordinator Direct Line [phone] Cell [phone] Toll Free: [phone]  Fax: [phone]

## 2021-04-04 ENCOUNTER — Other Ambulatory Visit: Payer: Self-pay

## 2021-04-04 NOTE — Patient Outreach (Signed)
[place] [facility]) Care Management  04/04/2021  Carmen [name] 17-Jul-1956 [id number]   Return call to patient for follow up referral. No answer.  HIPAA compliant voice message left.    Plan: RN CM will wait return call. If no return call will outreach as scheduled.    [name], RN, MSN [place] Management Care Management Coordinator Direct Line [phone] Cell [phone] Toll Free: [phone]  Fax: [phone]

## 2021-04-05 ENCOUNTER — Other Ambulatory Visit: Payer: Self-pay

## 2021-04-05 NOTE — Patient Outreach (Signed)
[place] [facility]) Care Management  04/05/2021  Carmen [name] 12/19/1955 [id number]   Referral Date:03/29/21 Referral Source:Nurseline Referral Reason:Out of Medication but unable to go to office for appointment.  Telephone call to patient for follow up.  She reports that she is unable to get out of home to go to MD and MD would not order medication until she is seen. She states that Dr. Hinda Lenis was supposed to see her as referred from by her PCP but he was unable to see her. Discussed with patient options of [facility] and [employer]. She will call them and let CM know of status.   She lives in the home with spouse Carmen [name] that helps her but he works part time. Patient able to bath but spouse helps with areas she cannot reach. Patient states she is pretty much bed bound but able to get around the house some.   Patient has history of CHF, obesity, atrial fibrillation, lymphedema, and HTN. She is out of her blood pressure medications: Metoprolol, Amiodarone, and Diltiazem.    Plan: RN CM will wait return call from patient to the status of MD appointment.  [name], RN, MSN [place] Management Care Management Coordinator Direct Line [phone] Cell [phone] Toll Free: [phone]  Fax: [phone]

## 2021-04-08 ENCOUNTER — Other Ambulatory Visit: Payer: Self-pay

## 2021-04-08 NOTE — Patient Outreach (Signed)
[place] [facility]) Care Management  04/08/2021  Carmen [name] 21-Jan-1956 [id number]   Telephone call to patient.  She states that she is going to be going with hospice at this.  Advised CM will close case at this time.  She verbalized understanding.   Plan: RN CM will close case.  [name], RN, MSN [place] Management Care Management Coordinator Direct Line [phone] Cell [phone] Toll Free: [phone]  Fax: [phone]

## 2021-04-09 ENCOUNTER — Other Ambulatory Visit: Payer: Self-pay | Admitting: Family Medicine

## 2021-04-09 DIAGNOSIS — R0902 Hypoxemia: Secondary | ICD-10-CM | POA: Diagnosis not present

## 2021-04-19 ENCOUNTER — Ambulatory Visit: Payer: Self-pay

## 2021-04-21 ENCOUNTER — Other Ambulatory Visit: Payer: Self-pay | Admitting: Internal Medicine

## 2021-04-26 DIAGNOSIS — I5032 Chronic diastolic (congestive) heart failure: Secondary | ICD-10-CM | POA: Diagnosis not present

## 2021-04-26 DIAGNOSIS — M6281 Muscle weakness (generalized): Secondary | ICD-10-CM | POA: Diagnosis not present

## 2021-05-26 DIAGNOSIS — I5032 Chronic diastolic (congestive) heart failure: Secondary | ICD-10-CM | POA: Diagnosis not present

## 2021-05-26 DIAGNOSIS — M6281 Muscle weakness (generalized): Secondary | ICD-10-CM | POA: Diagnosis not present

## 2021-06-02 ENCOUNTER — Other Ambulatory Visit: Payer: Self-pay | Admitting: Family Medicine

## 2021-06-26 DIAGNOSIS — M6281 Muscle weakness (generalized): Secondary | ICD-10-CM | POA: Diagnosis not present

## 2021-06-26 DIAGNOSIS — I5032 Chronic diastolic (congestive) heart failure: Secondary | ICD-10-CM | POA: Diagnosis not present

## 2021-07-27 DIAGNOSIS — I5032 Chronic diastolic (congestive) heart failure: Secondary | ICD-10-CM | POA: Diagnosis not present

## 2021-07-27 DIAGNOSIS — M6281 Muscle weakness (generalized): Secondary | ICD-10-CM | POA: Diagnosis not present

## 2021-08-11 ENCOUNTER — Other Ambulatory Visit: Payer: Self-pay | Admitting: Internal Medicine

## 2021-08-14 ENCOUNTER — Other Ambulatory Visit: Payer: Self-pay | Admitting: Family Medicine

## 2021-08-26 DIAGNOSIS — I5032 Chronic diastolic (congestive) heart failure: Secondary | ICD-10-CM | POA: Diagnosis not present

## 2021-08-26 DIAGNOSIS — M6281 Muscle weakness (generalized): Secondary | ICD-10-CM | POA: Diagnosis not present

## 2021-09-26 DIAGNOSIS — M6281 Muscle weakness (generalized): Secondary | ICD-10-CM | POA: Diagnosis not present

## 2021-09-26 DIAGNOSIS — I5032 Chronic diastolic (congestive) heart failure: Secondary | ICD-10-CM | POA: Diagnosis not present

## 2021-11-05 ENCOUNTER — Other Ambulatory Visit: Payer: Self-pay | Admitting: Internal Medicine

## 2021-12-12 ENCOUNTER — Other Ambulatory Visit: Payer: Self-pay | Admitting: Internal Medicine

## 2022-03-30 ENCOUNTER — Telehealth: Payer: Self-pay | Admitting: Nurse Practitioner

## 2022-03-31 ENCOUNTER — Telehealth: Payer: Self-pay | Admitting: Nurse Practitioner

## 2022-03-31 NOTE — Telephone Encounter (Signed)
Returned call to patient and discussed the Palliative referral/services with her and all questions were answered and she was in agreement with scheduling visit.  I have scheduled a Telehealth Consult for 04/06/22 @ 2:30 PM ?

## 2022-03-31 NOTE — Telephone Encounter (Signed)
Attempted to contact patient to offer to schedule Palliative Consult, no answer and unable to leave a message due to voicemail was not set up. ? ?I then attempted to contact patient's husband at listed number ([phone]) and this was not a working number. ? ?I also attepted to call [phone]) with no answer but was able to leave a HIPPA compliant message requesting a return call. ?

## 2022-04-06 ENCOUNTER — Other Ambulatory Visit: Payer: Self-pay | Admitting: Nurse Practitioner

## 2022-04-26 ENCOUNTER — Inpatient Hospital Stay
Admission: EM | Admit: 2022-04-26 | Discharge: 2022-05-27 | DRG: 177 | Disposition: E | Payer: PPO | Attending: Internal Medicine | Admitting: Internal Medicine

## 2022-04-26 ENCOUNTER — Inpatient Hospital Stay: Payer: PPO

## 2022-04-26 ENCOUNTER — Other Ambulatory Visit: Payer: Self-pay

## 2022-04-26 ENCOUNTER — Emergency Department: Payer: PPO

## 2022-04-26 DIAGNOSIS — Z8542 Personal history of malignant neoplasm of other parts of uterus: Secondary | ICD-10-CM

## 2022-04-26 DIAGNOSIS — E872 Acidosis, unspecified: Secondary | ICD-10-CM | POA: Diagnosis present

## 2022-04-26 DIAGNOSIS — Z66 Do not resuscitate: Secondary | ICD-10-CM | POA: Diagnosis present

## 2022-04-26 DIAGNOSIS — R778 Other specified abnormalities of plasma proteins: Secondary | ICD-10-CM

## 2022-04-26 DIAGNOSIS — J9622 Acute and chronic respiratory failure with hypercapnia: Secondary | ICD-10-CM | POA: Diagnosis present

## 2022-04-26 DIAGNOSIS — Z880 Allergy status to penicillin: Secondary | ICD-10-CM

## 2022-04-26 DIAGNOSIS — U071 COVID-19: Principal | ICD-10-CM | POA: Diagnosis present

## 2022-04-26 DIAGNOSIS — F32A Depression, unspecified: Secondary | ICD-10-CM | POA: Diagnosis present

## 2022-04-26 DIAGNOSIS — Z7901 Long term (current) use of anticoagulants: Secondary | ICD-10-CM | POA: Diagnosis not present

## 2022-04-26 DIAGNOSIS — I214 Non-ST elevation (NSTEMI) myocardial infarction: Secondary | ICD-10-CM | POA: Diagnosis present

## 2022-04-26 DIAGNOSIS — J9621 Acute and chronic respiratory failure with hypoxia: Secondary | ICD-10-CM | POA: Diagnosis present

## 2022-04-26 DIAGNOSIS — M170 Bilateral primary osteoarthritis of knee: Secondary | ICD-10-CM | POA: Diagnosis present

## 2022-04-26 DIAGNOSIS — G9341 Metabolic encephalopathy: Secondary | ICD-10-CM | POA: Diagnosis present

## 2022-04-26 DIAGNOSIS — R230 Cyanosis: Secondary | ICD-10-CM | POA: Diagnosis present

## 2022-04-26 DIAGNOSIS — Z515 Encounter for palliative care: Secondary | ICD-10-CM

## 2022-04-26 DIAGNOSIS — I5033 Acute on chronic diastolic (congestive) heart failure: Secondary | ICD-10-CM | POA: Diagnosis present

## 2022-04-26 DIAGNOSIS — I130 Hypertensive heart and chronic kidney disease with heart failure and stage 1 through stage 4 chronic kidney disease, or unspecified chronic kidney disease: Secondary | ICD-10-CM | POA: Diagnosis present

## 2022-04-26 DIAGNOSIS — I480 Paroxysmal atrial fibrillation: Secondary | ICD-10-CM | POA: Diagnosis present

## 2022-04-26 DIAGNOSIS — Z7189 Other specified counseling: Secondary | ICD-10-CM | POA: Diagnosis not present

## 2022-04-26 DIAGNOSIS — N1831 Chronic kidney disease, stage 3a: Secondary | ICD-10-CM | POA: Diagnosis present

## 2022-04-26 DIAGNOSIS — Z6841 Body Mass Index (BMI) 40.0 and over, adult: Secondary | ICD-10-CM

## 2022-04-26 DIAGNOSIS — Z9981 Dependence on supplemental oxygen: Secondary | ICD-10-CM | POA: Diagnosis not present

## 2022-04-26 DIAGNOSIS — I10 Essential (primary) hypertension: Secondary | ICD-10-CM | POA: Diagnosis not present

## 2022-04-26 DIAGNOSIS — Z881 Allergy status to other antibiotic agents status: Secondary | ICD-10-CM | POA: Diagnosis not present

## 2022-04-26 DIAGNOSIS — I890 Lymphedema, not elsewhere classified: Secondary | ICD-10-CM | POA: Diagnosis present

## 2022-04-26 LAB — CBC WITH DIFFERENTIAL/PLATELET
Abs Immature Granulocytes: 0.07 10*3/uL (ref 0.00–0.07)
Basophils Absolute: 0 10*3/uL (ref 0.0–0.1)
Basophils Relative: 0 %
Eosinophils Absolute: 0 10*3/uL (ref 0.0–0.5)
Eosinophils Relative: 0 %
HCT: 42.3 % (ref 36.0–46.0)
Hemoglobin: 12.3 g/dL (ref 12.0–15.0)
Immature Granulocytes: 1 %
Lymphocytes Relative: 18 %
Lymphs Abs: 1.2 10*3/uL (ref 0.7–4.0)
MCH: 29.6 pg (ref 26.0–34.0)
MCHC: 29.1 g/dL — ABNORMAL LOW (ref 30.0–36.0)
MCV: 101.7 fL — ABNORMAL HIGH (ref 80.0–100.0)
Monocytes Absolute: 0.4 10*3/uL (ref 0.1–1.0)
Monocytes Relative: 6 %
Neutro Abs: 5 10*3/uL (ref 1.7–7.7)
Neutrophils Relative %: 75 %
Platelets: 195 10*3/uL (ref 150–400)
RBC: 4.16 MIL/uL (ref 3.87–5.11)
RDW: 15.3 % (ref 11.5–15.5)
WBC: 6.7 10*3/uL (ref 4.0–10.5)
nRBC: 0 % (ref 0.0–0.2)

## 2022-04-26 LAB — TROPONIN I (HIGH SENSITIVITY)
Troponin I (High Sensitivity): 67 ng/L — ABNORMAL HIGH (ref <18)
Troponin I (High Sensitivity): 863 ng/L (ref <18)
Troponin I (High Sensitivity): 4052 ng/L (ref <18)

## 2022-04-26 LAB — GLUCOSE, CAPILLARY: Glucose-Capillary: 170 mg/dL — ABNORMAL HIGH (ref 70–99)

## 2022-04-26 LAB — BLOOD GAS, VENOUS
Acid-Base Excess: 4 mmol/L — ABNORMAL HIGH (ref 0.0–2.0)
Bicarbonate: 35.2 mmol/L — ABNORMAL HIGH (ref 20.0–28.0)
O2 Saturation: 62.4 %
Patient temperature: 37
pCO2, Ven: 90 mmHg (ref 44–60)
pH, Ven: 7.2 — ABNORMAL LOW (ref 7.25–7.43)
pO2, Ven: 38 mmHg (ref 32–45)

## 2022-04-26 LAB — MRSA NEXT GEN BY PCR, NASAL: MRSA by PCR Next Gen: NOT DETECTED

## 2022-04-26 LAB — BASIC METABOLIC PANEL
Anion gap: 9 (ref 5–15)
BUN: 32 mg/dL — ABNORMAL HIGH (ref 8–23)
CO2: 33 mmol/L — ABNORMAL HIGH (ref 22–32)
Calcium: 8.4 mg/dL — ABNORMAL LOW (ref 8.9–10.3)
Chloride: 97 mmol/L — ABNORMAL LOW (ref 98–111)
Creatinine, Ser: 1.08 mg/dL — ABNORMAL HIGH (ref 0.44–1.00)
GFR, Estimated: 56 mL/min — ABNORMAL LOW (ref >60)
Glucose, Bld: 181 mg/dL — ABNORMAL HIGH (ref 70–99)
Potassium: 4.5 mmol/L (ref 3.5–5.1)
Sodium: 139 mmol/L (ref 135–145)

## 2022-04-26 LAB — BRAIN NATRIURETIC PEPTIDE: B Natriuretic Peptide: 820.3 pg/mL — ABNORMAL HIGH (ref 0.0–100.0)

## 2022-04-26 LAB — PROTIME-INR
INR: 1.3 — ABNORMAL HIGH (ref 0.8–1.2)
Prothrombin Time: 16.2 seconds — ABNORMAL HIGH (ref 11.4–15.2)

## 2022-04-26 LAB — HIV ANTIBODY (ROUTINE TESTING W REFLEX): HIV Screen 4th Generation wRfx: NONREACTIVE

## 2022-04-26 LAB — HEPARIN LEVEL (UNFRACTIONATED): Heparin Unfractionated: 0.54 IU/mL (ref 0.30–0.70)

## 2022-04-26 LAB — SARS CORONAVIRUS 2 BY RT PCR: SARS Coronavirus 2 by RT PCR: POSITIVE — AB

## 2022-04-26 LAB — APTT: aPTT: 35 seconds (ref 24–36)

## 2022-04-26 MED ORDER — ATORVASTATIN CALCIUM 20 MG PO TABS: 40.0000 mg | ORAL_TABLET | Freq: Every day | ORAL | Status: DC

## 2022-04-26 MED ORDER — HYDRALAZINE HCL 20 MG/ML IJ SOLN
5.0000 mg | INTRAMUSCULAR | Status: DC | PRN
Start: 2022-04-26 — End: 2022-04-27

## 2022-04-26 MED ORDER — ACETAMINOPHEN 325 MG PO TABS: 650.0000 mg | ORAL_TABLET | Freq: Four times a day (QID) | ORAL | Status: DC | PRN

## 2022-04-26 MED ORDER — HEPARIN BOLUS VIA INFUSION
4000.0000 [IU] | Freq: Once | INTRAVENOUS | Status: AC
Start: 2022-04-26 — End: 2022-04-26
  Administered 2022-04-26: 4000 [IU] via INTRAVENOUS
  Filled 2022-04-26: qty 4000

## 2022-04-26 MED ORDER — ALBUTEROL SULFATE (2.5 MG/3ML) 0.083% IN NEBU: 2.5000 mg | INHALATION_SOLUTION | RESPIRATORY_TRACT | Status: DC | PRN

## 2022-04-26 MED ORDER — ORAL CARE MOUTH RINSE: 15.0000 mL | Freq: Two times a day (BID) | OROMUCOSAL | Status: DC

## 2022-04-26 MED ORDER — FUROSEMIDE 10 MG/ML IJ SOLN
40.0000 mg | Freq: Two times a day (BID) | INTRAMUSCULAR | Status: DC
  Administered 2022-04-26 – 2022-04-27 (×3): 40 mg via INTRAVENOUS
  Filled 2022-04-26 (×3): qty 4

## 2022-04-26 MED ORDER — MORPHINE SULFATE (PF) 2 MG/ML IV SOLN
2.0000 mg | INTRAVENOUS | Status: DC | PRN
  Administered 2022-04-26 – 2022-04-27 (×2): 2 mg via INTRAVENOUS
  Administered 2022-04-27: 4 mg via INTRAVENOUS
  Administered 2022-04-27 (×2): 2 mg via INTRAVENOUS
  Filled 2022-04-26 (×3): qty 1

## 2022-04-26 MED ORDER — ASPIRIN 81 MG PO TBEC: 81.0000 mg | DELAYED_RELEASE_TABLET | Freq: Every day | ORAL | Status: DC

## 2022-04-26 MED ORDER — ONDANSETRON HCL 4 MG/2ML IJ SOLN: 4.0000 mg | Freq: Three times a day (TID) | INTRAMUSCULAR | Status: DC | PRN

## 2022-04-26 MED ORDER — IPRATROPIUM-ALBUTEROL 0.5-2.5 (3) MG/3ML IN SOLN
3.0000 mL | RESPIRATORY_TRACT | Status: DC
  Administered 2022-04-26 – 2022-04-27 (×8): 3 mL via RESPIRATORY_TRACT
  Filled 2022-04-26 (×8): qty 3

## 2022-04-26 MED ORDER — IPRATROPIUM-ALBUTEROL 0.5-2.5 (3) MG/3ML IN SOLN
RESPIRATORY_TRACT | Status: AC
  Administered 2022-04-26: 3 mL
  Filled 2022-04-26: qty 9

## 2022-04-26 MED ORDER — HYDROMORPHONE HCL 1 MG/ML IJ SOLN
1.0000 mg | Freq: Once | INTRAMUSCULAR | Status: AC
  Administered 2022-04-26: 1 mg via INTRAVENOUS
  Filled 2022-04-26: qty 1

## 2022-04-26 MED ORDER — CHLORHEXIDINE GLUCONATE CLOTH 2 % EX PADS: 6.0000 | MEDICATED_PAD | Freq: Every day | CUTANEOUS | Status: DC

## 2022-04-26 MED ORDER — ASPIRIN 81 MG PO CHEW
324.0000 mg | CHEWABLE_TABLET | Freq: Once | ORAL | Status: DC
Start: 2022-04-26 — End: 2022-04-27

## 2022-04-26 MED ORDER — HEPARIN (PORCINE) 25000 UT/250ML-% IV SOLN
1050.0000 [IU]/h | INTRAVENOUS | Status: DC
  Administered 2022-04-26: 1050 [IU]/h via INTRAVENOUS
  Filled 2022-04-26: qty 250

## 2022-04-26 MED ORDER — FUROSEMIDE 10 MG/ML IJ SOLN
INTRAMUSCULAR | Status: AC
  Administered 2022-04-26: 80 mg
  Filled 2022-04-26: qty 8

## 2022-04-26 MED ORDER — MORPHINE SULFATE (PF) 4 MG/ML IV SOLN
6.0000 mg | Freq: Once | INTRAVENOUS | Status: AC
  Administered 2022-04-26: 6 mg via INTRAVENOUS
  Filled 2022-04-26: qty 2

## 2022-04-26 MED ORDER — DM-GUAIFENESIN ER 30-600 MG PO TB12: 1.0000 | ORAL_TABLET | Freq: Two times a day (BID) | ORAL | Status: DC | PRN

## 2022-04-26 MED ORDER — CHLORHEXIDINE GLUCONATE CLOTH 2 % EX PADS
6.0000 | MEDICATED_PAD | Freq: Every day | CUTANEOUS | Status: DC
Start: 2022-04-27 — End: 2022-04-26
  Administered 2022-04-26: 6 via TOPICAL

## 2022-04-26 MED ORDER — ACETAMINOPHEN 650 MG RE SUPP: 650.0000 mg | Freq: Four times a day (QID) | RECTAL | Status: DC | PRN

## 2022-04-26 MED ORDER — CHLORHEXIDINE GLUCONATE 0.12 % MT SOLN
15.0000 mL | Freq: Two times a day (BID) | OROMUCOSAL | Status: DC
  Administered 2022-04-26: 15 mL via OROMUCOSAL
  Filled 2022-04-26: qty 15

## 2022-04-26 NOTE — Assessment & Plan Note (Addendum)
Possibly due to acute on chronic diastolic CHF. BNP 280. CXR showed nonspecific bilateral whiteout.  Left is worse than the right.  Consulted to Dr. Lanney Gins of ICU.  Discussed goal of care with family.  Family wants patient to be treated aggressively at this moment, but if patient deteriorates, they would consider comfort care.  -Admitted to stepdown as inpatient -Bronchodilators -BiPAP -IV Lasix 40 mg twice daily -palliative consult

## 2022-04-26 NOTE — Assessment & Plan Note (Signed)
Patient's previous BMI in the 50s and on admission BMI was 78, in part due to significant volume overload from fluid.

## 2022-04-26 NOTE — ED Provider Notes (Signed)
[facility] Provider Note    Event Date/Time   First MD Initiated Contact with Patient 04/11/2022 [phone]     (approximate)   History   Respiratory Distress   HPI  Carmen [name] is a 66 y.o. female history of CHF, endometrial cancer, hypertension, lymphedema, morbid obesity, chronic respiratory failure, DNR/DNI who presents for respiratory distress.  EMS was called to the house by family member.  Patient was found to be in severe respiratory distress and hypoxic to the 80s.  Transported on a nonrebreather.  Arrives in the emergency room in severe respiratory distress, cyanotic, satting 23% on a nonrebreather. Patient unable to provide any history due to severe respiratory distress/     Past Medical History:  Diagnosis Date   CHF (congestive heart failure) (HCC)    Endometrial cancer (HCC)    Grade 1   Hypertension    Lymphedema    Morbid obesity ([place])    Osteoarthritis of both knees     Past Surgical History:  Procedure Laterality Date   ROBOTIC ASSISTED LAP VAGINAL HYSTERECTOMY  01/10/2011   BSO     Physical Exam   Triage Vital Signs: ED Triage Vitals  Enc Vitals Group     BP 04/08/2022 0628 (!) 139/48     Pulse Rate 03/31/2022 0628 85     Resp 04/11/2022 0628 (!) 30     Temp --      Temp src --      SpO2 04/24/2022 0628 (!) 68 %     Weight 04/15/2022 0631 (!) 302 lb 0.5 oz (137 kg)     Height 04/21/2022 0631 '5\' 1"'$  (1.549 m)     Head Circumference --      Peak Flow --      Pain Score --      Pain Loc --      Pain Edu? --      Excl. in [place]? --     Most recent vital signs: Vitals:   03/31/2022 0650 04/02/2022 0700  BP: (!) 133/55 (!) 127/53  Pulse: 85 84  Resp: (!) 26 (!) 26  SpO2: (!) 70% (!) 71%     Constitutional: Agonal respirations, severe respiratory distress, cyanotic HEENT:      Head: Normocephalic and atraumatic.         Eyes: Conjunctivae are normal. Sclera is non-icteric.       Mouth/Throat: Mucous membranes are moist.        Neck: Supple with no signs of meningismus. Cardiovascular: Regular rate and rhythm. No murmurs, gallops, or rubs. 2+ symmetrical distal pulses are present in all extremities.  Respiratory: The respiratory distress, cyanotic, hypoxic satting 23% on a nonrebreather with diffuse crackles and wheezing bilaterally  gastrointestinal: Soft, non tender, and non distended. Musculoskeletal: Chronic changes of lymphedema Neurologic: Normal speech and language. Face is symmetric. Moving all extremities. No gross focal neurologic deficits are appreciated. Skin: Skin is warm, dry and intact. No rash noted.   ED Results / Procedures / Treatments   Labs (all labs ordered are listed, but only abnormal results are displayed) Labs Reviewed  CBC WITH DIFFERENTIAL/PLATELET - Abnormal; Notable for the following components:      Result Value   MCV 101.7 (*)    MCHC 29.1 (*)    All other components within normal limits  BLOOD GAS, VENOUS - Abnormal; Notable for the following components:   pH, Ven 7.2 (*)    pCO2, Ven 90 (*)  Bicarbonate 35.2 (*)    Acid-Base Excess 4.0 (*)    All other components within normal limits  TROPONIN I (HIGH SENSITIVITY) - Abnormal; Notable for the following components:   Troponin I (High Sensitivity) 67 (*)    All other components within normal limits  BRAIN NATRIURETIC PEPTIDE  BASIC METABOLIC PANEL     EKG  ED ECG REPORT I, Rudene Re, the attending physician, personally viewed and interpreted this ECG.  Sinus rhythm with a rate of 87, diffuse ST depressions with no ST elevation.  RADIOLOGY I, Rudene Re, attending MD, have personally viewed and interpreted the images obtained during this visit as below:  Chest x-ray showing complete whiteout of both lungs   ___________________________________________________ Interpretation by Radiologist:  DG Chest Portable 1 View  Addendum Date: 04/23/2022   ADDENDUM REPORT: 04/21/2022 07:00 ADDENDUM: Study  discussed by telephone with Dr. Rudene Re on 04/08/2022 at 0657 hours. She advises the patient is DNR/DNI, with agonal respiration. Electronically Signed   By: Genevie Ann M.D.   On: 03/27/2022 07:00   Result Date: 03/31/2022 CLINICAL DATA:  67 year old female unresponsive, respiratory distress. Endometrial cancer, recent hospice. EXAM: PORTABLE CHEST 1 VIEW COMPARISON:  Portable chest 10/27/2020 and earlier. FINDINGS: Portable AP upright view at 0637 hours. White out of the left hemithorax. Mediastinal contours obscured. Some tracheal air column is visible and appears clear. Subtotal opacification of the contralateral right lung also, only some right lung markings are visible. No acute osseous abnormality identified. Visible ribs appear intact. Paucity bowel gas in the upper abdomen. IMPRESSION: Nonspecific white out of the lungs left > right. Mediastinal contours obscured. Some tracheal air is visible. Electronically Signed: By: Genevie Ann M.D. On: 03/29/2022 06:51       PROCEDURES:  Critical Care performed: Yes, see critical care procedure note(s)  .Critical Care Performed by: Rudene Re, MD Authorized by: Rudene Re, MD   Critical care provider statement:    Critical care time (minutes):  45   Critical care was necessary to treat or prevent imminent or life-threatening deterioration of the following conditions:  Respiratory failure, circulatory failure, CNS failure or compromise, cardiac failure and shock   Critical care was time spent personally by me on the following activities:  Development of treatment plan with patient or surrogate, discussions with consultants, evaluation of patient's response to treatment, examination of patient, ordering and review of laboratory studies, ordering and review of radiographic studies, ordering and performing treatments and interventions, pulse oximetry, re-evaluation of patient's condition and review of old charts   I assumed direction of  critical care for this patient from another provider in my specialty: no     Care discussed with: admitting provider      IMPRESSION / MDM / [place] / ED COURSE  I reviewed the triage vital signs and the nursing notes.  66 y.o. female history of CHF, endometrial cancer, hypertension, lymphedema, morbid obesity, chronic respiratory failure, DNR/DNI who presents for respiratory distress.  Patient arrives in severe respiratory distress with agonal respirations, cyanotic, satting 23% on nonrebreather with diffuse wheezing and crackles.  DNR documentation present with patient on arrival.  Patient unable to provide any history.  No family at bedside.  Patient was started on BiPAP given 80 mg of IV Lasix and 3 DuoNebs.  Chest x-ray shows complete whiteout of both lungs.  After an 30 minutes on BiPAP patient still with severe air hunger and hypoxic satting in the 70s on 100% O2 via BiPAP.  Husband and son are now at bedside.  I had a long discussion with them about patient's suffering from air hunger and indication for IV morphine to help her be comfortable.  Husband is in agreement therefore we will give her 6 mg of IV morphine for air hunger.  Her VBG shows a pH of 7.2 with a PCO2 of 90.  Initial troponin is a 67.  BNP and BMP are pending.  Hospitalist service was consulted for admission.    MEDICATIONS GIVEN IN ED: Medications  morphine (PF) 4 MG/ML injection 6 mg (has no administration in time range)  furosemide (LASIX) 10 MG/ML injection (80 mg  Given 03/28/2022 0630)  ipratropium-albuterol (DUONEB) 0.5-2.5 (3) MG/3ML nebulizer solution (3 mLs  Given 04/06/2022 0646)   Consults: Hospitalist   EMR reviewed including last visit with her primary care doctor from 2021 for dizziness and notes from hospice    FINAL CLINICAL IMPRESSION(S) / ED DIAGNOSES   Final diagnoses:  Acute on chronic respiratory failure with hypoxia and hypercapnia ([place])     Rx / DC Orders   ED Discharge Orders      None        Note:  This document was prepared using Dragon voice recognition software and may include unintentional dictation errors.   Please note:  Patient was evaluated in Emergency Department today for the symptoms described in the history of present illness. Patient was evaluated in the context of the global COVID-19 pandemic, which necessitated consideration that the patient might be at risk for infection with the SARS-CoV-2 virus that causes COVID-19. Institutional protocols and algorithms that pertain to the evaluation of patients at risk for COVID-19 are in a state of rapid change based on information released by regulatory bodies including the CDC and federal and state organizations. These policies and algorithms were followed during the patient's care in the ED.  Some ED evaluations and interventions may be delayed as a result of limited staffing during the pandemic.       Alfred Levins, [place], MD 04/25/2022 [phone]

## 2022-04-26 NOTE — Consult Note (Signed)
Consultation Note Date: 04/18/2022   Patient Name: Carmen [name]  DOB: 15-Aug-1956  MRN: [id number]  Age / Sex: 66 y.o., female  PCP: Alvester Chou, NP Referring Physician: Ivor Costa, MD  Reason for Consultation: Establishing goals of care  HPI/Patient Profile: 66 y.o. female  with past medical history of CHF, endometrial cancer, hypertension, lymphedema, morbid obesity, chronic respiratory failure, and DNR/DNI admitted on 04/11/2022 with respiratory distress.  When patient arrived in the ER she was in severe respiratory distress, cyanotic, and satting 23% on a nonrebreather.  Chest x-ray showed complete whiteout of both lungs.  Patient placed on BiPAP and given IV Lasix.  Patient recently discharged from hospice care.  PMT consulted to discuss goals of care.  Clinical Assessment and Goals of Care: I have reviewed medical records including EPIC notes, labs and imaging, assessed the patient and then met with patient's family to discuss diagnosis prognosis, GOC, EOL wishes, disposition and options.  I introduced Palliative Medicine as specialized medical care for people living with serious illness. It focuses on providing relief from the symptoms and stress of a serious illness. The goal is to improve quality of life for both the patient and the family.   We discussed patient's current illness and what it means in the larger context of patient's on-going co-morbidities.  Natural disease trajectory and expectations at EOL were discussed.  We reviewed that patient remains with oxygen saturations in the 70s despite BiPAP at 100%.  We also reviewed her mental status -she remains poorly responsive.  We discussed that she currently appears comfortable.  I attempted to elicit values and goals of care important to the patient.  Family report that patient shared she wanted to "try".  They are agreeable to continue current plan with medications including IV Lasix  along with BiPAP.  The difference between aggressive medical intervention and comfort care was considered in light of the patient's goals of care.   Family confirms patient has previously stated wishes for DNR status along with DNI.  Discussed with family the importance of continued conversation with family and the medical providers regarding overall plan of care and treatment options, ensuring decisions are within the context of the patients values and GOCs.    Family shares patient was recently discharged from hospice services because she was doing well -this was about a month ago -she was being followed by outpatient palliative.  Questions and concerns were addressed. The family was encouraged to call with questions or concerns.  Primary Decision Maker NEXT OF KIN    SUMMARY OF RECOMMENDATIONS   Continue current care, allow time for outcomes -family aware of concern for poor outcome Family concerned about patient not urinating with external catheter -requested RN bladder scan and Place indwelling if needed  Code Status/Advance Care Planning: DNR      Primary Diagnoses: Present on Admission:  Acute metabolic encephalopathy  Acute on chronic respiratory failure with hypoxia and hypercapnia (HCC)  Acute on chronic diastolic CHF (congestive heart failure) (HCC)  NSTEMI (non-ST elevated myocardial infarction) ([place])  Hypertension  Chronic kidney disease, stage 3a ([place])  Depression   I have reviewed the medical record, interviewed the patient and family, and examined the patient. The following aspects are pertinent.  Past Medical History:  Diagnosis Date   CHF (congestive heart failure) (HCC)    Endometrial cancer (HCC)    Grade 1   Hypertension    Lymphedema    Morbid obesity (HCC)    Osteoarthritis of both knees  Social History   Socioeconomic History   Marital status: Married    Spouse name: Not on file   Number of children: Not on file   Years of education:  Not on file   Highest education level: Not on file  Occupational History   Not on file  Tobacco Use   Smoking status: Never   Smokeless tobacco: Never  Substance and Sexual Activity   Alcohol use: No   Drug use: No   Sexual activity: Never  Other Topics Concern   Not on file  Social History Narrative   Not on file   Social Determinants of Health   Financial Resource Strain: Not on file  Food Insecurity: Not on file  Transportation Needs: Not on file  Physical Activity: Not on file  Stress: Not on file  Social Connections: Not on file   No family history on file. Scheduled Meds:  aspirin  324 mg Oral Once   atorvastatin  40 mg Oral Daily   furosemide  40 mg Intravenous Q12H   ipratropium-albuterol  3 mL Nebulization Q4H   Continuous Infusions: PRN Meds:.acetaminophen, acetaminophen, albuterol, dextromethorphan-guaiFENesin, hydrALAZINE, ondansetron (ZOFRAN) IV Allergies  Allergen Reactions   Clarithromycin     REACTION: respiratory issues, can't breathe   Penicillins     REACTION: can't breath   Review of Systems  Unable to perform ROS: Acuity of condition   Physical Exam Constitutional:      Appearance: She is ill-appearing.     Comments: obtunded  Pulmonary:     Comments: Remains on BiPAP FiO2 100% with oxygen saturation in the 70s Skin:    General: Skin is warm and dry.    Vital Signs: BP (!) 87/45   Pulse 60   Temp 97.7 F (36.5 C) (Axillary)   Resp 19   Ht 5' 1"  (1.549 m)   Wt (!) 137 kg   SpO2 (!) 80%   BMI 57.07 kg/m  Pain Scale: 0-10       SpO2: SpO2: (!) 80 % O2 Device:SpO2: (!) 80 % O2 Flow Rate: .   IO: Intake/output summary: No intake or output data in the 24 hours ending 04/06/2022 1329  LBM:   Baseline Weight: Weight: (!) 137 kg Most recent weight: Weight: (!) 137 kg     Palliative Assessment/Data:     *Please note that this is a verbal dictation therefore any spelling or grammatical errors are due to the "[place]  [employer]" system interpretation.   Juel Burrow, DNP, AGNP-C Palliative Medicine Team [phone] Pager: [phone]

## 2022-04-26 NOTE — ED Triage Notes (Addendum)
Pt comes from home via [facility] with respiratory distress. EMS states pt wears  3L at baseline, O2 sat was 86% on 3L. Pt has hx of CHF. Recently removed from hospice care. Pt not responsive

## 2022-04-26 NOTE — Consult Note (Signed)
[facility]  CARDIOLOGY CONSULT NOTE  Patient ID: Carmen [name] MRN: [id number] DOB/AGE: 02-26-1956 66 y.o.  Admit date: 04/09/2022 Referring Physician Blaine Hamper Primary Physician [facility] Primary Cardiologist [facility] Reason for Consultation elevated troponin  HPI: 66 year old female referred for evaluation of elevated troponin.  She has had failing health for at least 2 years.  She been seen by home hospice care for the past 6 months.  Patient was brought to [facility] ED in respiratory distress, cyanotic with oxygen saturation of 23% on a nonrebreather.  Chest x-ray revealed whiteout left greater than right.  The patient was placed on BiPAP, treated with intravenous furosemide.  ECG revealed sinus rhythm with nonspecific ST depressions leads III and aVF, and nonspecific ST elevation in lead aVL which looked similar to prior ECG from 10/24/2020.  According to family members, the patient has not been experiencing chest pain.  Other admission labs include mildly elevated BNP of 820.3.  Family members also confirmed that the patient is DNR, wishes not to be intubated or placed on mechanical ventilator.  Review of systems complete and found to be negative unless listed above     Past Medical History:  Diagnosis Date   CHF (congestive heart failure) (HCC)    Endometrial cancer (HCC)    Grade 1   Hypertension    Lymphedema    Morbid obesity ([place])    Osteoarthritis of both knees     Past Surgical History:  Procedure Laterality Date   ROBOTIC ASSISTED LAP VAGINAL HYSTERECTOMY  01/10/2011   BSO    (Not in a hospital admission)  Social History   Socioeconomic History   Marital status: Married    Spouse name: Not on file   Number of children: Not on file   Years of education: Not on file   Highest education level: Not on file  Occupational History   Not on file  Tobacco Use   Smoking status: Never   Smokeless tobacco: Never  Substance and Sexual Activity   Alcohol use: No   Drug use: No    Sexual activity: Never  Other Topics Concern   Not on file  Social History Narrative   Not on file   Social Determinants of Health   Financial Resource Strain: Not on file  Food Insecurity: Not on file  Transportation Needs: Not on file  Physical Activity: Not on file  Stress: Not on file  Social Connections: Not on file  Intimate Partner Violence: Not on file    No family history on file.    Review of systems complete and found to be negative unless listed above      PHYSICAL EXAM  General: Well developed, well nourished, in no acute distress HEENT:  Normocephalic and atramatic Neck:  No JVD.  Lungs: Clear bilaterally to auscultation and percussion. Heart: HRRR . Normal S1 and S2 without gallops or murmurs.  Abdomen: Bowel sounds are positive, abdomen soft and non-tender  Msk:  Back normal, normal gait. Normal strength and tone for age. Extremities: No clubbing, cyanosis or edema.   Neuro: Alert and oriented X 3. Psych:  Good affect, responds appropriately  Labs:   Lab Results  Component Value Date   WBC 6.7 04/22/2022   HGB 12.3 03/29/2022   HCT 42.3 03/31/2022   MCV 101.7 (H) 04/25/2022   PLT 195 04/12/2022    Recent Labs  Lab 04/08/2022 0723  NA 139  K 4.5  CL 97*  CO2 33*  BUN 32*  CREATININE 1.08*  CALCIUM 8.4*  GLUCOSE 181*   Lab Results  Component Value Date   TROPONINI < 0.02 03/10/2014    Lab Results  Component Value Date   CHOL 125 03/11/2014   CHOL 184 09/02/2010   CHOL 195 06/22/2007   Lab Results  Component Value Date   HDL 26 (L) 03/11/2014   HDL 36.90 (L) 09/02/2010   HDL 41 06/22/2007   Lab Results  Component Value Date   LDLCALC 84 03/11/2014   LDLCALC 129 (H) 09/02/2010   LDLCALC 127 06/22/2007   Lab Results  Component Value Date   TRIG 73 03/11/2014   TRIG 91.0 09/02/2010   TRIG 134 06/22/2007   Lab Results  Component Value Date   CHOLHDL 5 09/02/2010   No results found for: LDLDIRECT    Radiology: CT HEAD  WO CONTRAST (5MM)  Result Date: 04/08/2022 CLINICAL DATA:  Altered mental status. EXAM: CT HEAD WITHOUT CONTRAST TECHNIQUE: Contiguous axial images were obtained from the base of the skull through the vertex without intravenous contrast. RADIATION DOSE REDUCTION: This exam was performed according to the departmental dose-optimization program which includes automated exposure control, adjustment of the mA and/or kV according to patient size and/or use of iterative reconstruction technique. COMPARISON:  CT October 24, 2020 FINDINGS: Despite efforts by the technologist and patient, motion artifact is present on today's exam and could not be eliminated. This reduces exam sensitivity and specificity. Brain: No evidence of acute infarction, hemorrhage, hydrocephalus, extra-axial collection or mass lesion/mass effect. Vascular: No hyperdense vessel or unexpected calcification. Skull: Normal. Negative for fracture or focal lesion. Sinuses/Orbits: No acute finding. Other: None. IMPRESSION: No acute intracranial process on this motion degraded examination. Electronically Signed   By: Dahlia Bailiff M.D.   On: 04/11/2022 14:48   DG Chest Portable 1 View  Addendum Date: 04/08/2022   ADDENDUM REPORT: 04/09/2022 07:00 ADDENDUM: Study discussed by telephone with Dr. Rudene Re on 03/31/2022 at 0657 hours. She advises the patient is DNR/DNI, with agonal respiration. Electronically Signed   By: Genevie Ann M.D.   On: 04/16/2022 07:00   Result Date: 04/09/2022 CLINICAL DATA:  66 year old female unresponsive, respiratory distress. Endometrial cancer, recent hospice. EXAM: PORTABLE CHEST 1 VIEW COMPARISON:  Portable chest 10/27/2020 and earlier. FINDINGS: Portable AP upright view at 0637 hours. White out of the left hemithorax. Mediastinal contours obscured. Some tracheal air column is visible and appears clear. Subtotal opacification of the contralateral right lung also, only some right lung markings are visible. No acute  osseous abnormality identified. Visible ribs appear intact. Paucity bowel gas in the upper abdomen. IMPRESSION: Nonspecific white out of the lungs left > right. Mediastinal contours obscured. Some tracheal air is visible. Electronically Signed: By: Genevie Ann M.D. On: 04/13/2022 06:51    EKG: Sinus rhythm with nonspecific ST depression leads III and aVF, nonspecific ST elevation aVL, which appears unchanged compared to prior ECG.  ASSESSMENT AND PLAN:   1.  Elevated troponin, consistent with NSTEMI, in the absence of chest pain or new ECG changes, in the setting of respiratory failure 2.  Respiratory failure, probable acute on chronic diastolic congestive heart failure, chest x-ray revealing white out left greater than right 3.  Morbid obesity, poor general health, DNR, DO NOT INTUBATE 4.  Chronic kidney disease, stage IIIa 5.  Acute metabolic encephalopathy  Recommendations  1.  Agree with current therapy 2.  Continue diuresis 3.  Carefully monitor renal status 4.  Heparin drip for 48 to 72 hours 5.  Defer invasive cardiac evaluation.  Patient not a candidate for cardiac catheterization due to comorbidities and poor general health. 6.  Review 2D echocardiogram  Signed: Isaias Cowman MD,PhD, [facility] 03/30/2022, 4:58 PM

## 2022-04-26 NOTE — Assessment & Plan Note (Addendum)
Secondary to hypoxia.  CT head negative.

## 2022-04-26 NOTE — Plan of Care (Signed)
  Problem: Respiratory: Goal: Will maintain a patent airway Outcome: Progressing   Problem: Activity: Goal: Capacity to carry out activities will improve Outcome: Not Progressing   Problem: Clinical Measurements: Goal: Respiratory complications will improve Outcome: Not Progressing

## 2022-04-26 NOTE — ED Notes (Signed)
Purewick displaced and patient saturated in urine. Bladder scan unsuccessful.

## 2022-04-26 NOTE — Consult Note (Signed)
CRITICAL CARE PROGRESS NOTE    Name: Carmen [name] MRN: [id number] DOB: [birth year]     LOS: 1   SUBJECTIVE FINDINGS & SIGNIFICANT EVENTS    Patient description:  This is a 66 year old female with a history of respiratory failure chronically with diastolic CHF and lymphedema who uses supplemental oxygen at home.  She also has CKD, uterine cancer and morbid obesity with a BMI over 50 she came into the hospital with acute on chronic hypoxemic respiratory failure and chest discomfort.  She was noted to be saturating less than 50% on arrival and required BiPAP NIV.  She does have DNR/DNI CODE STATUS.  She had VBG done which was acidemic and elevated troponin with elevated BNP suggestive of cardiac stress possible acute on chronic diastolic heart failure.  She was found to have acute COVID-19 infection.  PCCM consultation placed for further evaluation  Lines/tubes : Urethral Catheter C. Sosa RN Double-lumen 14 Fr. (Active)  Indication for Insertion or Continuance of Catheter Unstable critically ill patients first 24-48 hours (See Criteria) May 01, 2022 0817  Site Assessment Red;Bleeding 04/16/2022 2110  Catheter Maintenance Bag below level of bladder;Catheter secured;Drainage bag/tubing not touching floor;No dependent loops;Seal intact 05/01/2022 0817  Collection Container Standard drainage bag 05-01-22 0817  Securement Method Securing device (Describe) May 01, 2022 0817  Urinary Catheter Interventions (if applicable) Unclamped 78/29/56 0817  Output (mL) 30 mL 2022-05-01 0900    Microbiology/Sepsis markers: Results for orders placed or performed during the hospital encounter of 04/09/2022  MRSA Next Gen by PCR, Nasal     Status: None   Collection Time: 03/31/2022  6:20 PM   Specimen: Nasal Mucosa; Nasal Swab  Result Value Ref Range  Status   MRSA by PCR Next Gen NOT DETECTED NOT DETECTED Final    Comment: (NOTE) The GeneXpert MRSA Assay (FDA approved for NASAL specimens only), is one component of a comprehensive MRSA colonization surveillance program. It is not intended to diagnose MRSA infection nor to guide or monitor treatment for MRSA infections. Test performance is not FDA approved in patients less than 50 years old. Performed at [facility], [address] [place], [place] [address]   SARS Coronavirus 2 by RT PCR (hospital order, performed in [facility] hospital lab) *cepheid single result test* Nasal Mucosa     Status: Abnormal   Collection Time: 04/18/2022  7:00 PM   Specimen: Nasal Mucosa; Nasal Swab  Result Value Ref Range Status   SARS Coronavirus 2 by RT PCR POSITIVE (A) NEGATIVE Final    Comment: (NOTE) SARS-CoV-2 target nucleic acids are DETECTED  SARS-CoV-2 RNA is generally detectable in upper respiratory specimens  during the acute phase of infection.  Positive results are indicative  of the presence of the identified virus, but do not rule out bacterial infection or co-infection with other pathogens not detected by the test.  Clinical correlation with patient history and  other diagnostic information is necessary to determine patient infection status.  The expected result is negative.  Fact Sheet for Patients:   https://www.patel.info/   Fact Sheet for Healthcare Providers:   https://hall.com/    This test is not yet approved or cleared by the [place] FDA and  has been authorized for detection and/or diagnosis of SARS-CoV-2 by FDA under an Emergency Use Authorization (EUA).  This EUA will remain in effect (meaning this test can be used) for the duration of  the COVID-19 declaration under Section 564(b)(1)  of the Act, 21 U.S.C. section 360-bbb-3(b)(1), unless the  authorization is terminated or revoked sooner.   Performed at  [facility], [address] [place], [place] [address]     Anti-infectives:  Anti-infectives (From admission, onward)    None          PAST MEDICAL HISTORY   Past Medical History:  Diagnosis Date   CHF (congestive heart failure) (HCC)    Endometrial cancer (HCC)    Grade 1   Hypertension    Lymphedema    Morbid obesity ([place])    Osteoarthritis of both knees      SURGICAL HISTORY   Past Surgical History:  Procedure Laterality Date   ROBOTIC ASSISTED LAP VAGINAL HYSTERECTOMY  01/10/2011   BSO     FAMILY HISTORY   No family history on file.   SOCIAL HISTORY   Social History   Tobacco Use   Smoking status: Never   Smokeless tobacco: Never  Substance Use Topics   Alcohol use: No   Drug use: No     MEDICATIONS   Current Medication:  Current Facility-Administered Medications:    acetaminophen (TYLENOL) suppository 650 mg, 650 mg, Rectal, Q6H PRN, Ivor Costa, MD   acetaminophen (TYLENOL) tablet 650 mg, 650 mg, Oral, Q6H PRN, Ivor Costa, MD   albuterol (PROVENTIL) (2.5 MG/3ML) 0.083% nebulizer solution 2.5 mg, 2.5 mg, Nebulization, Q4H PRN, Ivor Costa, MD   aspirin chewable tablet 324 mg, 324 mg, Oral, Once, Ivor Costa, MD   aspirin EC tablet 81 mg, 81 mg, Oral, Daily, Ivor Costa, MD   atorvastatin (LIPITOR) tablet 40 mg, 40 mg, Oral, Daily, Ivor Costa, MD   chlorhexidine (PERIDEX) 0.12 % solution 15 mL, 15 mL, Mouth Rinse, BID, Ivor Costa, MD, 15 mL at 2022/05/11 0745   Chlorhexidine Gluconate Cloth 2 % PADS 6 each, 6 each, Topical, Daily, Ivor Costa, MD   dextromethorphan-guaiFENesin ([place] DM) 30-600 MG per 12 hr tablet 1 tablet, 1 tablet, Oral, BID PRN, Ivor Costa, MD   furosemide (LASIX) injection 40 mg, 40 mg, Intravenous, Q12H, Ivor Costa, MD, 40 mg at 11-May-2022 0745   [COMPLETED] heparin bolus via infusion 4,000 Units, 4,000 Units, Intravenous, Once, 4,000 Units at 04/21/2022 1713 **FOLLOWED BY** heparin ADULT infusion 100 units/mL (25000  units/267m), 1,050 Units/hr, Intravenous, Continuous, CBenita Gutter RPH, Last Rate: 10.5 mL/hr at [id number]-[id number], 1,050 Units/hr at 02023-06-150700   hydrALAZINE (APRESOLINE) injection 5 mg, 5 mg, Intravenous, Q2H PRN, NIvor Costa MD   ipratropium-albuterol (DUONEB) 0.5-2.5 (3) MG/3ML nebulizer solution 3 mL, 3 mL, Nebulization, Q4H, NIvor Costa MD, 3 mL at [id number]   MEDLINE mouth rinse, 15 mL, Mouth Rinse, BID, NIvor Costa MD, 15 mL at 0June 15, 20230902   morphine (PF) 2 MG/ML injection 2 mg, 2 mg, Intravenous, Q4H PRN, NIvor Costa MD, 2 mg at [id number]   ondansetron (ZOFRAN) injection 4 mg, 4 mg, Intravenous, Q8H PRN, NIvor Costa MD  Facility-Administered Medications Ordered in Other Encounters:    perflutren lipid microspheres (DEFINITY) IV suspension, 1-10 mL, Intravenous, PRN, Paraschos, Alexander, MD, 2 mL at 0Jun 15, [id number]    ALLERGIES   Clarithromycin and Penicillins    REVIEW OF SYSTEMS     Unable to obtain ROS due to critically ill status on BiPAP  PHYSICAL EXAMINATION   Vital Signs: Temp:  [97.2 F (36.2 C)-98.4 F (36.9 C)] 98.4 F (36.9 C) (06/01 0750) Pulse Rate:  [57-90] 73 (06/01 0900) Resp:  [14-25] 20 (06/01 0900) BP: (80-172)/(45-91) 112/67 (06/01 0900) SpO2:  [68 %-88 %] 83 % (  06/01 0900) FiO2 (%):  [100 %] 100 % (06/01 0831) Weight:  [187.6 kg-188.9 kg] 187.6 kg (06/01 0215)  GENERAL:morbidly obese on BIPAP age appropriate HEAD: Normocephalic, atraumatic.  EYES: Pupils equal, round, non reactive 67m MOUTH: Moist mucosal membrane. NECK: Supple. No thyromegaly. No nodules. No JVD.  PULMONARY: decreased bs bilaterally CARDIOVASCULAR: S1 and S2. Regular rate and rhythm. No murmurs, rubs, or gallops.  GASTROINTESTINAL: Soft, nontender, non-distended. No masses. Positive bowel sounds. No hepatosplenomegaly.  MUSCULOSKELETAL: No swelling, clubbing, or edema.  NEUROLOGIC: Mild distress due to acute illness SKIN:intact,warm,dry   PERTINENT DATA      Infusions:  heparin 1,050 Units/hr (02023-06-140700)   Scheduled Medications:  aspirin  324 mg Oral Once   aspirin EC  81 mg Oral Daily   atorvastatin  40 mg Oral Daily   chlorhexidine  15 mL Mouth Rinse BID   Chlorhexidine Gluconate Cloth  6 each Topical Daily   furosemide  40 mg Intravenous Q12H   ipratropium-albuterol  3 mL Nebulization Q4H   mouth rinse  15 mL Mouth Rinse BID   PRN Medications: acetaminophen, acetaminophen, albuterol, dextromethorphan-guaiFENesin, hydrALAZINE, morphine injection, ondansetron (ZOFRAN) IV Hemodynamic parameters:   Intake/Output: 05/31 0701 - 06/01 0700 In: 183.3 [I.V.:183.3] Out: 185 [Urine:185]  Ventilator  Settings: FiO2 (%):  [100 %] 100 %    LAB RESULTS:  Basic Metabolic Panel: Recent Labs  Lab 04/25/2022 0723 [id number]  NA 139 143  K 4.5 5.1  CL 97* 103  CO2 33* 33*  GLUCOSE 181* 152*  BUN 32* 38*  CREATININE 1.08* 1.33*  CALCIUM 8.4* 8.2*  MG  --  2.4   Liver Function Tests: Recent Labs  Lab 02023-06-140423  AST 243*  ALT 99*  ALKPHOS 91  BILITOT 0.9  PROT 7.3  ALBUMIN 2.6*   No results for input(s): LIPASE, AMYLASE in the last 168 hours. No results for input(s): AMMONIA in the last 168 hours. CBC: Recent Labs  Lab 04/10/2022 0654 02023/06/140423  WBC 6.7 7.5  NEUTROABS 5.0  --   HGB 12.3 12.0  HCT 42.3 40.4  MCV 101.7* 100.5*  PLT 195 196   Cardiac Enzymes: No results for input(s): CKTOTAL, CKMB, CKMBINDEX, TROPONINI in the last 168 hours. BNP: Invalid input(s): POCBNP CBG: Recent Labs  Lab 04/20/2022 1812 02023-06-140744  GLUCAP 170* 146*       IMAGING RESULTS:  Imaging: CT HEAD WO CONTRAST (5MM)  Result Date: 04/03/2022 CLINICAL DATA:  Altered mental status. EXAM: CT HEAD WITHOUT CONTRAST TECHNIQUE: Contiguous axial images were obtained from the base of the skull through the vertex without intravenous contrast. RADIATION DOSE REDUCTION: This exam was performed according to the  departmental dose-optimization program which includes automated exposure control, adjustment of the mA and/or kV according to patient size and/or use of iterative reconstruction technique. COMPARISON:  CT October 24, 2020 FINDINGS: Despite efforts by the technologist and patient, motion artifact is present on today's exam and could not be eliminated. This reduces exam sensitivity and specificity. Brain: No evidence of acute infarction, hemorrhage, hydrocephalus, extra-axial collection or mass lesion/mass effect. Vascular: No hyperdense vessel or unexpected calcification. Skull: Normal. Negative for fracture or focal lesion. Sinuses/Orbits: No acute finding. Other: None. IMPRESSION: No acute intracranial process on this motion degraded examination. Electronically Signed   By: JDahlia BailiffM.D.   On: 04/08/2022 14:48   DG Chest Portable 1 View  Addendum Date: 04/04/2022   ADDENDUM REPORT: 04/10/2022 07:00 ADDENDUM: Study discussed by telephone with Dr.  [place] VERONESE on 04/25/2022 at 0657 hours. She advises the patient is DNR/DNI, with agonal respiration. Electronically Signed   By: Genevie Ann M.D.   On: 04/06/2022 07:00   Result Date: 04/23/2022 CLINICAL DATA:  66 year old female unresponsive, respiratory distress. Endometrial cancer, recent hospice. EXAM: PORTABLE CHEST 1 VIEW COMPARISON:  Portable chest 10/27/2020 and earlier. FINDINGS: Portable AP upright view at 0637 hours. White out of the left hemithorax. Mediastinal contours obscured. Some tracheal air column is visible and appears clear. Subtotal opacification of the contralateral right lung also, only some right lung markings are visible. No acute osseous abnormality identified. Visible ribs appear intact. Paucity bowel gas in the upper abdomen. IMPRESSION: Nonspecific white out of the lungs left > right. Mediastinal contours obscured. Some tracheal air is visible. Electronically Signed: By: Genevie Ann M.D. On: 04/05/2022 06:51   '@PROBHOSP'$ @ CT HEAD WO  CONTRAST (5MM)  Result Date: 04/25/2022 CLINICAL DATA:  Altered mental status. EXAM: CT HEAD WITHOUT CONTRAST TECHNIQUE: Contiguous axial images were obtained from the base of the skull through the vertex without intravenous contrast. RADIATION DOSE REDUCTION: This exam was performed according to the departmental dose-optimization program which includes automated exposure control, adjustment of the mA and/or kV according to patient size and/or use of iterative reconstruction technique. COMPARISON:  CT October 24, 2020 FINDINGS: Despite efforts by the technologist and patient, motion artifact is present on today's exam and could not be eliminated. This reduces exam sensitivity and specificity. Brain: No evidence of acute infarction, hemorrhage, hydrocephalus, extra-axial collection or mass lesion/mass effect. Vascular: No hyperdense vessel or unexpected calcification. Skull: Normal. Negative for fracture or focal lesion. Sinuses/Orbits: No acute finding. Other: None. IMPRESSION: No acute intracranial process on this motion degraded examination. Electronically Signed   By: Dahlia Bailiff M.D.   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 -Sinew BiPAP for now -decadron '6mg'$  IV daily  -Diuresis - Lasix 40 IV daily - monitor UOP - utilize external urinary catheter if possible -Self prone if patient can tolerate  -encourage to use IS and Acapella device for bronchopulmonary hygiene when able -consider Actemra if CRP increments >20 -d/c hepatotoxic medications while on remdesevir -supportive care with ICU telemetry monitoring -PT/OT when possible -procalcitonin, CRP and ferritin trending    Acute on chronic chronic diastolic CHF -oxygen as needed -Lasix as tolerated -follow up cardiac enzymes as indicated ICU  monitoring  Chronic renal Failure- -follow chem 7 -follow UO -continue Foley Catheter-assess need daily   ID -continue IV abx as prescibed -follow up cultures  GI/Nutrition GI PROPHYLAXIS as indicated DIET-->TF's as tolerated Constipation protocol as indicated  ENDO - ICU hypoglycemic\Hyperglycemia protocol -check FSBS per protocol   ELECTROLYTES -follow labs as needed -replace as needed -pharmacy consultation   DVT/GI PRX ordered -SCDs  TRANSFUSIONS AS NEEDED MONITOR FSBS ASSESS the need for LABS as needed   Critical care provider statement:   Total critical care time: 33 minutes   Performed by: Lanney Gins MD   Critical care time was exclusive of separately billable procedures and treating other patients.   Critical care was necessary to treat or prevent imminent or life-threatening deterioration.   Critical care was time spent personally by me on the following activities: development of treatment plan with patient and/or surrogate as well as nursing, discussions with consultants, evaluation  of patient's response to treatment, examination of patient, obtaining history from patient or surrogate, ordering and performing treatments and interventions, ordering and review of laboratory studies, ordering and review of radiographic studies, pulse oximetry and re-evaluation of patient's condition.    Ottie Glazier, M.D.  Pulmonary & Critical Care Medicine

## 2022-04-26 NOTE — Assessment & Plan Note (Signed)
-   Hold oral medications due to altered mental status.

## 2022-04-26 NOTE — Consult Note (Signed)
[place] for IV Heparin Indication: chest pain/ACS  Patient Measurements: Height: '5\' 1"'$  (154.9 cm) Weight: (!) 137 kg (302 lb 0.5 oz) IBW/kg (Calculated) : 47.8 Heparin Dosing Weight: 82.9 kg  Labs: Recent Labs    03/27/2022 0654 04/04/2022 0723 04/25/2022 0758 03/29/2022 1101  HGB 12.3  --   --   --   HCT 42.3  --   --   --   PLT 195  --   --   --   CREATININE  --  1.08*  --   --   TROPONINIHS 67*  --  863* 4,052*    Estimated Creatinine Clearance: 65.7 mL/min (A) (by C-G formula based on SCr of 1.08 mg/dL (H)).   Medical History: Past Medical History:  Diagnosis Date   CHF (congestive heart failure) (HCC)    Endometrial cancer (HCC)    Grade 1   Hypertension    Lymphedema    Morbid obesity ([place])    Osteoarthritis of both knees     Medications:  No anticoagulation prior to admission per my chart review  Assessment: Patient is a 66 y/o F with medical history as above who was BIBEMS from home with respiratory distress. Recently removed from hospice care. Troponin elevated and trending up. Pharmacy consulted to initiate and manage heparin infusion for suspected ACS.  Baseline CBC within normal limits. Baseline aPTT and PT-INR are pending.  Goal of Therapy:  Heparin level 0.3-0.7 units/ml Monitor platelets by anticoagulation protocol: Yes   Plan:  --Heparin 4000 unit IV bolus followed by continuous infusion at 1050 units/hr --HL 6 hours after initiation of infusion --Daily CBC per protocol while on IV heparin  Benita Gutter 04/21/2022,4:29 PM

## 2022-04-26 NOTE — Assessment & Plan Note (Signed)
Blood pressure soft with a heart rate ranging 60s to 80s. -Placed on IV heparin, and hold Eliquis -Held home amiodarone, Coreg, Cardizem, metoprolol (patient is not taking these medications currently)

## 2022-04-26 NOTE — H&P (Signed)
History and Physical    SAVANAH BAYLES NWG:[id number] DOB: [birth year] DOA: 04/09/2022  Referring MD/NP/PA:   PCP: Alvester Chou, NP   Patient coming from:  The patient is coming from home.  Chief Complaint:  AMS and acute respiratory distress  HPI: Carmen [name] is a 66 y.o. female with medical history significant of chronic respiratory failure on 5 L oxygen per her husband, dCHF, chronic severe lymphedema in both legs, PAF on Eliquis, CKD-3A, depression, uterine cancer, morbid obesity with BMI 57.07, who presents with altered mental status, and acute respiratory distress.   Per family at bedside, pt was found to have severe respiratory distress at home. Pt was transported to ED on nonrebreather.  At arrival to ED, pt is in severe respiratory distress, cyanotic, satting 23% on a nonrebreather. BIPAP is started, with oxygen saturation 70-80s on BiPAP.  Pt is in agonal breathing, with minimal responsive. When I saw pt in ED, pt is barely arousable. She seems to be able to hear family member's voice, slightly moves all 4 extremities on painful stimuli.  Patient has bilateral dilated pupil which are not responsive to light. Per family, patient has nausea, no active vomiting, diarrhea.  Does not seem to have chest pain or abdominal pain. No acute coughing.    Data Reviewed and ED Course: pt was found to have Trop 67 --> 863, VBG with pH 7.2, CO2 90, O2 38, BNP 820, stable renal function, temperature normal, blood pressure soft with SBP 80s-90s, heart rate 66-85, RR 30. CXR showed nonspecific whiteout bilaterally (left is worse L>R). Consulted Dr. Saralyn Pilar of cardiology and Dr. Lanney Gins of ICU   EKG: I have personally reviewed.  Sinus rhythm, QTc 446, low voltage, ST depression in inferior leads and V3-V6  Review of Systems: Could not be reviewed due to altered mental status.    Allergy:  Allergies  Allergen Reactions   Clarithromycin     REACTION: respiratory issues, can't breathe    Penicillins     REACTION: can't breath    Past Medical History:  Diagnosis Date   CHF (congestive heart failure) (HCC)    Endometrial cancer (HCC)    Grade 1   Hypertension    Lymphedema    Morbid obesity ([place])    Osteoarthritis of both knees     Past Surgical History:  Procedure Laterality Date   ROBOTIC ASSISTED LAP VAGINAL HYSTERECTOMY  01/10/2011   BSO    Social History:  reports that she has never smoked. She has never used smokeless tobacco. She reports that she does not drink alcohol and does not use drugs.  Family History: No family history on file.  Could not be reviewed due to altered mental status.  Prior to Admission medications   Medication Sig Start Date End Date Taking? Authorizing Provider  amiodarone (PACERONE) 200 MG tablet TAKE 2 TABLETS BY MOUTH ONCE A DAY 01/26/21   Cletis Athens, MD  apixaban (ELIQUIS) 5 MG TABS tablet Take 1 tablet (5 mg total) by mouth 2 (two) times daily. 11/01/20   Fritzi Mandes, MD  carvedilol (COREG) 6.25 MG tablet TAKE 1 TABLET TWICE DAILY WITH A MEAL 08/15/21   Fayrene Helper, MD  cloNIDine (CATAPRES) 0.1 MG tablet TAKE 1 TABLET EVERY DAY 08/11/21   Cletis Athens, MD  diltiazem (CARDIZEM CD) 240 MG 24 hr capsule TAKE 1 CAPSULE (240 MG TOTAL) BY MOUTH DAILY. 11/07/21   Cletis Athens, MD  enalapril (VASOTEC) 5 MG tablet Take 1 tablet (5 mg  total) by mouth daily. 12/14/20   Cletis Athens, MD  fexofenadine (ALLEGRA) 180 MG tablet Take 180 mg by mouth daily.    [provider]  furosemide (LASIX) 40 MG tablet Take 1 tablet (40 mg total) by mouth 2 (two) times daily. 10/07/20   Pahwani, Michell Heinrich, MD  metoprolol tartrate (LOPRESSOR) 25 MG tablet TAKE 1 TABLET BY MOUTH TWICE A DAY 01/26/21   Cletis Athens, MD  Multiple Vitamins-Minerals (MEGA MULTI WOMEN PO) Take 1,200 mg by mouth daily.    [provider]  potassium chloride (KLOR-CON) 10 MEQ tablet TAKE 1 TABLET AT BEDTIME 07/05/20   Cletis Athens, MD  QUEtiapine (SEROQUEL) 25 MG  tablet TAKE 1 TABLET BY MOUTH EVERY NIGHT AT BEDTIME 01/27/21   Cletis Athens, MD  traMADol (ULTRAM) 50 MG tablet Take 1 tablet (50 mg total) by mouth every 8 (eight) hours as needed. 12/07/20   Cletis Athens, MD  vitamin E 400 UNIT capsule Take 400 Units by mouth 2 (two) times daily.    [provider]    Physical Exam: Vitals:   04/02/2022 1633 04/21/2022 1700 04/09/2022 1715 03/27/2022 1800  BP:  (!) 126/59  133/73  Pulse: 76 76 76 75  Resp: (!) 22 (!) 23 (!) 22 20  Temp:      TempSrc:      SpO2: (!) 81% (!) 78% (!) 76% (!) 75%  Weight:    (!) 188.9 kg  Height:    '5\' 1"'$  (1.549 m)   General: Not in acute distress HEENT:       Eyes: PERRL, EOMI, no scleral icterus.       ENT: No discharge from the ears and nose       Neck: Difficult to assess JVD due to morbid obesity, no bruit, no mass felt. Heme: No neck lymph node enlargement. Cardiac: S1/S2, RRR, No murmurs, No gallops or rubs. Respiratory: Has decreased air movement bilaterally GI: Soft, nondistended, nontender, no organomegaly, BS present. GU: No hematuria Ext: Has severe chronic bilateral lymphedema in legs  musculoskeletal: No joint deformities, No joint redness or warmth, no limitation of ROM in spin. Skin: No rashes.  Neuro:not arousable, not following command. Has fixed and dilated pupils bilaterally which is not responsive to light.  Patient slightly moves extremities on painful stimuli. Psych: Patient is not psychotic  Labs on Admission: I have personally reviewed following labs and imaging studies  CBC: Recent Labs  Lab 04/23/2022 0654  WBC 6.7  NEUTROABS 5.0  HGB 12.3  HCT 42.3  MCV 101.7*  PLT 630   Basic Metabolic Panel: Recent Labs  Lab 03/31/2022 0723  NA 139  K 4.5  CL 97*  CO2 33*  GLUCOSE 181*  BUN 32*  CREATININE 1.08*  CALCIUM 8.4*   GFR: Estimated Creatinine Clearance: 82 mL/min (A) (by C-G formula based on SCr of 1.08 mg/dL (H)). Liver Function Tests: No results for input(s): AST,  ALT, ALKPHOS, BILITOT, PROT, ALBUMIN in the last 168 hours. No results for input(s): LIPASE, AMYLASE in the last 168 hours. No results for input(s): AMMONIA in the last 168 hours. Coagulation Profile: Recent Labs  Lab 04/14/2022 1709  INR 1.3*   Cardiac Enzymes: No results for input(s): CKTOTAL, CKMB, CKMBINDEX, TROPONINI in the last 168 hours. BNP (last 3 results) No results for input(s): PROBNP in the last 8760 hours. HbA1C: No results for input(s): HGBA1C in the last 72 hours. CBG: Recent Labs  Lab 04/24/2022 1812  GLUCAP 170*   Lipid  Profile: No results for input(s): CHOL, HDL, LDLCALC, TRIG, CHOLHDL, LDLDIRECT in the last 72 hours. Thyroid Function Tests: No results for input(s): TSH, T4TOTAL, FREET4, T3FREE, THYROIDAB in the last 72 hours. Anemia Panel: No results for input(s): VITAMINB12, FOLATE, FERRITIN, TIBC, IRON, RETICCTPCT in the last 72 hours. Urine analysis:    Component Value Date/Time   COLORURINE YELLOW (A) 10/24/2020 2205   APPEARANCEUR CLOUDY (A) 10/24/2020 2205   LABSPEC 1.010 10/24/2020 2205   PHURINE 5.0 10/24/2020 2205   GLUCOSEU NEGATIVE 10/24/2020 2205   HGBUR MODERATE (A) 10/24/2020 2205   BILIRUBINUR NEGATIVE 10/24/2020 2205   KETONESUR NEGATIVE 10/24/2020 2205   PROTEINUR 100 (A) 10/24/2020 2205   UROBILINOGEN 0.2 01/19/2011 1500   NITRITE NEGATIVE 10/24/2020 2205   LEUKOCYTESUR LARGE (A) 10/24/2020 2205   Sepsis Labs: '@LABRCNTIP'$ (procalcitonin:4,lacticidven:4) )No results found for this or any previous visit (from the past 240 hour(s)).   Radiological Exams on Admission: CT HEAD WO CONTRAST (5MM)  Result Date: 04/22/2022 CLINICAL DATA:  Altered mental status. EXAM: CT HEAD WITHOUT CONTRAST TECHNIQUE: Contiguous axial images were obtained from the base of the skull through the vertex without intravenous contrast. RADIATION DOSE REDUCTION: This exam was performed according to the departmental dose-optimization program which includes automated  exposure control, adjustment of the mA and/or kV according to patient size and/or use of iterative reconstruction technique. COMPARISON:  CT October 24, 2020 FINDINGS: Despite efforts by the technologist and patient, motion artifact is present on today's exam and could not be eliminated. This reduces exam sensitivity and specificity. Brain: No evidence of acute infarction, hemorrhage, hydrocephalus, extra-axial collection or mass lesion/mass effect. Vascular: No hyperdense vessel or unexpected calcification. Skull: Normal. Negative for fracture or focal lesion. Sinuses/Orbits: No acute finding. Other: None. IMPRESSION: No acute intracranial process on this motion degraded examination. Electronically Signed   By: Dahlia Bailiff M.D.   On: 04/20/2022 14:48   DG Chest Portable 1 View  Addendum Date: 04/24/2022   ADDENDUM REPORT: 04/02/2022 07:00 ADDENDUM: Study discussed by telephone with Dr. Rudene Re on 04/03/2022 at 0657 hours. She advises the patient is DNR/DNI, with agonal respiration. Electronically Signed   By: Genevie Ann M.D.   On: 04/17/2022 07:00   Result Date: 04/13/2022 CLINICAL DATA:  66 year old female unresponsive, respiratory distress. Endometrial cancer, recent hospice. EXAM: PORTABLE CHEST 1 VIEW COMPARISON:  Portable chest 10/27/2020 and earlier. FINDINGS: Portable AP upright view at 0637 hours. White out of the left hemithorax. Mediastinal contours obscured. Some tracheal air column is visible and appears clear. Subtotal opacification of the contralateral right lung also, only some right lung markings are visible. No acute osseous abnormality identified. Visible ribs appear intact. Paucity bowel gas in the upper abdomen. IMPRESSION: Nonspecific white out of the lungs left > right. Mediastinal contours obscured. Some tracheal air is visible. Electronically Signed: By: Genevie Ann M.D. On: 04/13/2022 06:51      Assessment/Plan Principal Problem:   Acute on chronic respiratory failure with  hypoxia and hypercapnia (HCC) Active Problems:   Acute on chronic diastolic CHF (congestive heart failure) (HCC)   NSTEMI (non-ST elevated myocardial infarction) ([place])   Acute metabolic encephalopathy   Hypertension   Depression   Chronic kidney disease, stage 3a ([place])   Morbid obesity with BMI of 50.0-59.9, adult (HCC)   PAF (paroxysmal atrial fibrillation) (HCC)   Principal Problem:   Acute on chronic respiratory failure with hypoxia and hypercapnia (HCC) Active Problems:   Acute on chronic diastolic CHF (congestive heart failure) ([place])  NSTEMI (non-ST elevated myocardial infarction) ([place])   Acute metabolic encephalopathy   Hypertension   Depression   Chronic kidney disease, stage 3a ([place])   Morbid obesity with BMI of 50.0-59.9, adult (HCC)   PAF (paroxysmal atrial fibrillation) (HCC)   Assessment and Plan: * Acute on chronic respiratory failure with hypoxia and hypercapnia (HCC) Possibly due to acute on chronic diastolic CHF. BNP 280. CXR showed nonspecific bilateral whiteout.  Left is worse than the right.  Consulted to Dr. Lanney Gins of ICU.  Discussed goal of care with family.  Family wants patient to be treated aggressively at this moment, but if patient deteriorates, they would consider comfort care.  -Admitted to stepdown as inpatient -Bronchodilators -BiPAP -IV Lasix 40 mg twice daily -palliative consult  Acute on chronic diastolic CHF (congestive heart failure) ([place]) 2D echo 09/27/2020 showed EF of 55 to 60%.  Now has CHF exacerbation, with BNP 820. -IV Lasix as above  NSTEMI (non-ST elevated myocardial infarction) ([place]) Troponin level 67, 863, 4052.  Consulted Dr. Saralyn Pilar. -Started on IV heparin -Aspirin -Start Lipitor 80 mg daily -Trend troponin -Check A1c, FLP -2D echo  Acute metabolic encephalopathy CT head negative.  Possibly due to hypoxia. -Frequent neurochecks  Hypertension Blood pressure is soft -Hold blood pressure  medications   Depression - Hold oral medications due to altered mental status.  Chronic kidney disease, stage 3a ([place]) Stable -Follow-up with BMP  Morbid obesity with BMI of 50.0-59.9, adult (HCC) BMI= 57.07   and BW= 188.9 -Diet and exercise.   -Encouraged to lose weight.   PAF (paroxysmal atrial fibrillation) (HCC) -Bp is soft. HR 60-80s -on IV heparin, and hold Eliquis -hold home amiodarone, Coreg, Cardizem, metoprolol (patient is not taking these medications currently)             DVT ppx: SQ Heparin         SQ Lovenox  Code Status: DNR  Family Communication:   Yes, patient's   patient's husband, son, brother and sister-in-law at bed side.     Disposition Plan:  Anticipate discharge back to previous environment  Consults called:  Consulted Dr. Saralyn Pilar of cardiology and Dr. Lanney Gins of ICU  Admission status and Level of care: Stepdown:   SDU/inpation         Severity of Illness:  The appropriate patient status for this patient is INPATIENT. Inpatient status is judged to be reasonable and necessary in order to provide the required intensity of service to ensure the patient's safety. The patient's presenting symptoms, physical exam findings, and initial radiographic and laboratory data in the context of their chronic comorbidities is felt to place them at high risk for further clinical deterioration. Furthermore, it is not anticipated that the patient will be medically stable for discharge from the hospital within 2 midnights of admission.   * I certify that at the point of admission it is my clinical judgment that the patient will require inpatient hospital care spanning beyond 2 midnights from the point of admission due to high intensity of service, high risk for further deterioration and high frequency of surveillance required.*       Date of Service 03/28/2022    [facility]   If 7PM-7AM, please contact  night-coverage [email or web address] 04/24/2022, 6:46 PM

## 2022-04-26 NOTE — Assessment & Plan Note (Signed)
2D echo 09/27/2020 showed EF of 55 to 60%.  Now has CHF exacerbation, with BNP 820. -IV Lasix as above

## 2022-04-26 NOTE — Progress Notes (Signed)
Admission profile updated. ?

## 2022-04-26 NOTE — Progress Notes (Signed)
   04/20/2022 1040  Clinical Encounter Type  Visited With Patient and family together;Health care provider  Visit Type Initial;Spiritual support;Social support  Spiritual Encounters  Spiritual Needs Prayer   Chaplain Burris offered compassionate, non-anxious presence to pt and family gathered at bedside. Pt could not respond but Chaplain B let her know she was present with her along with her family. Chaplain B encouraged family to be present in ways that might include music or other ways to support soothing, comforting environment. Chaplain B offered prayer at family request. Will f/u as needed.

## 2022-04-26 NOTE — Assessment & Plan Note (Addendum)
Stable -Follow-up with BMP 

## 2022-04-26 NOTE — Assessment & Plan Note (Signed)
Blood pressure is soft -Hold blood pressure medications

## 2022-04-26 NOTE — Assessment & Plan Note (Signed)
Troponin trended upward to 4000.  Cardiology consulted and patient put on IV heparin.  Initial plan for cardiac stabilization deferred until patient was more stable.  Then canceled when she was made comfort care.

## 2022-04-27 ENCOUNTER — Inpatient Hospital Stay
Admit: 2022-04-27 | Discharge: 2022-04-27 | Disposition: A | Discharge status: Home / Self Care | Payer: PPO | Attending: Cardiology | Admitting: Cardiology

## 2022-04-27 DIAGNOSIS — I5033 Acute on chronic diastolic (congestive) heart failure: Secondary | ICD-10-CM | POA: Diagnosis not present

## 2022-04-27 DIAGNOSIS — Z515 Encounter for palliative care: Secondary | ICD-10-CM | POA: Diagnosis not present

## 2022-04-27 DIAGNOSIS — U071 COVID-19: Secondary | ICD-10-CM | POA: Diagnosis present

## 2022-04-27 DIAGNOSIS — Z7189 Other specified counseling: Secondary | ICD-10-CM | POA: Diagnosis not present

## 2022-04-27 DIAGNOSIS — J9621 Acute and chronic respiratory failure with hypoxia: Secondary | ICD-10-CM | POA: Diagnosis not present

## 2022-04-27 DIAGNOSIS — Z66 Do not resuscitate: Secondary | ICD-10-CM | POA: Diagnosis not present

## 2022-04-27 DIAGNOSIS — N1831 Chronic kidney disease, stage 3a: Secondary | ICD-10-CM | POA: Diagnosis not present

## 2022-04-27 LAB — COMPREHENSIVE METABOLIC PANEL
ALT: 99 U/L — ABNORMAL HIGH (ref 0–44)
AST: 243 U/L — ABNORMAL HIGH (ref 15–41)
Albumin: 2.6 g/dL — ABNORMAL LOW (ref 3.5–5.0)
Alkaline Phosphatase: 91 U/L (ref 38–126)
Anion gap: 7 (ref 5–15)
BUN: 38 mg/dL — ABNORMAL HIGH (ref 8–23)
CO2: 33 mmol/L — ABNORMAL HIGH (ref 22–32)
Calcium: 8.2 mg/dL — ABNORMAL LOW (ref 8.9–10.3)
Chloride: 103 mmol/L (ref 98–111)
Creatinine, Ser: 1.33 mg/dL — ABNORMAL HIGH (ref 0.44–1.00)
GFR, Estimated: 44 mL/min — ABNORMAL LOW (ref >60)
Glucose, Bld: 152 mg/dL — ABNORMAL HIGH (ref 70–99)
Potassium: 5.1 mmol/L (ref 3.5–5.1)
Sodium: 143 mmol/L (ref 135–145)
Total Bilirubin: 0.9 mg/dL (ref 0.3–1.2)
Total Protein: 7.3 g/dL (ref 6.5–8.1)

## 2022-04-27 LAB — LIPID PANEL
Cholesterol: 124 mg/dL (ref 0–200)
HDL: 21 mg/dL — ABNORMAL LOW (ref >40)
LDL Cholesterol: 79 mg/dL (ref 0–99)
Total CHOL/HDL Ratio: 5.9 RATIO
Triglycerides: 118 mg/dL (ref <150)
VLDL: 24 mg/dL (ref 0–40)

## 2022-04-27 LAB — CBC
HCT: 40.4 % (ref 36.0–46.0)
Hemoglobin: 12 g/dL (ref 12.0–15.0)
MCH: 29.9 pg (ref 26.0–34.0)
MCHC: 29.7 g/dL — ABNORMAL LOW (ref 30.0–36.0)
MCV: 100.5 fL — ABNORMAL HIGH (ref 80.0–100.0)
Platelets: 196 10*3/uL (ref 150–400)
RBC: 4.02 MIL/uL (ref 3.87–5.11)
RDW: 15.3 % (ref 11.5–15.5)
WBC: 7.5 10*3/uL (ref 4.0–10.5)
nRBC: 0 % (ref 0.0–0.2)

## 2022-04-27 LAB — ECHOCARDIOGRAM COMPLETE
AV Mean grad: 10 mmHg
AV Peak grad: 16.6 mmHg
Ao pk vel: 2.04 m/s
Height: 61 in
Weight: 6617.33 oz

## 2022-04-27 LAB — MAGNESIUM: Magnesium: 2.4 mg/dL (ref 1.7–2.4)

## 2022-04-27 LAB — HEPARIN LEVEL (UNFRACTIONATED): Heparin Unfractionated: 0.56 IU/mL (ref 0.30–0.70)

## 2022-04-27 LAB — GLUCOSE, CAPILLARY: Glucose-Capillary: 146 mg/dL — ABNORMAL HIGH (ref 70–99)

## 2022-04-27 LAB — HEMOGLOBIN A1C
Hgb A1c MFr Bld: 5.7 % — ABNORMAL HIGH (ref 4.8–5.6)
Mean Plasma Glucose: 116.89 mg/dL

## 2022-04-27 MED ORDER — GLYCOPYRROLATE 0.2 MG/ML IJ SOLN: 0.2000 mg | INTRAMUSCULAR | Status: DC | PRN

## 2022-04-27 MED ORDER — ORAL CARE MOUTH RINSE
15.0000 mL | Freq: Two times a day (BID) | OROMUCOSAL | Status: DC
  Administered 2022-04-27: 15 mL via OROMUCOSAL

## 2022-04-27 MED ORDER — BIOTENE DRY MOUTH MT LIQD: 15.0000 mL | Freq: Three times a day (TID) | OROMUCOSAL | Status: DC

## 2022-04-27 MED ORDER — LORAZEPAM 2 MG/ML IJ SOLN
2.0000 mg | INTRAMUSCULAR | Status: DC | PRN
  Administered 2022-04-27: 2 mg via INTRAVENOUS
  Filled 2022-04-27: qty 1

## 2022-04-27 MED ORDER — CHLORHEXIDINE GLUCONATE 0.12 % MT SOLN
15.0000 mL | Freq: Two times a day (BID) | OROMUCOSAL | Status: DC
  Administered 2022-04-27: 15 mL via OROMUCOSAL
  Filled 2022-04-27: qty 15

## 2022-04-27 MED ORDER — DEXAMETHASONE SODIUM PHOSPHATE 10 MG/ML IJ SOLN
6.0000 mg | INTRAMUSCULAR | Status: DC
  Administered 2022-04-27: 6 mg via INTRAVENOUS
  Filled 2022-04-27: qty 0.6

## 2022-04-27 MED ORDER — MORPHINE BOLUS VIA INFUSION: 4.0000 mg | INTRAVENOUS | Status: DC | PRN

## 2022-04-27 MED ORDER — SODIUM CHLORIDE 0.9 % IV SOLN
200.0000 mg | Freq: Once | INTRAVENOUS | Status: DC
  Filled 2022-04-27: qty 40

## 2022-04-27 MED ORDER — GLYCOPYRROLATE 1 MG PO TABS: 1.0000 mg | ORAL_TABLET | ORAL | Status: DC | PRN

## 2022-04-27 MED ORDER — SODIUM CHLORIDE 0.9 % IV BOLUS
250.0000 mL | Freq: Once | INTRAVENOUS | Status: DC
Start: 2022-04-27 — End: 2022-04-27

## 2022-04-27 MED ORDER — MORPHINE 100MG IN NS 100ML (1MG/ML) PREMIX INFUSION
1.0000 mg/h | INTRAVENOUS | Status: DC
  Administered 2022-04-27: 2 mg/h via INTRAVENOUS
  Filled 2022-04-27: qty 100

## 2022-04-27 MED ORDER — MIDODRINE HCL 5 MG PO TABS: 5.0000 mg | ORAL_TABLET | Freq: Once | ORAL | Status: DC

## 2022-04-27 MED ORDER — SODIUM CHLORIDE 0.9 % IV SOLN: 100.0000 mg | Freq: Every day | INTRAVENOUS | Status: DC

## 2022-04-27 MED ORDER — DEXAMETHASONE SODIUM PHOSPHATE 10 MG/ML IJ SOLN
6.0000 mg | INTRAMUSCULAR | Status: DC
Start: 2022-04-27 — End: 2022-04-27

## 2022-04-27 MED ORDER — ACETYLCYSTEINE 20 % IN SOLN
4.0000 mL | RESPIRATORY_TRACT | Status: DC
  Administered 2022-04-27: 4 mL via RESPIRATORY_TRACT
  Filled 2022-04-27: qty 4

## 2022-04-27 MED ORDER — MORPHINE BOLUS VIA INFUSION: 2.0000 mg | INTRAVENOUS | Status: DC | PRN

## 2022-04-27 MED ORDER — POLYVINYL ALCOHOL 1.4 % OP SOLN: 1.0000 [drp] | Freq: Four times a day (QID) | OPHTHALMIC | Status: DC | PRN

## 2022-04-27 MED ORDER — PERFLUTREN LIPID MICROSPHERE
1.0000 mL | INTRAVENOUS | Status: AC | PRN
  Administered 2022-04-27: 2 mL via INTRAVENOUS

## 2022-04-27 DEATH — deceased

## 2022-04-30 IMAGING — CT CT ANGIO CHEST
2 of 6 series · 18 of 46 positions shown · IV contrast (APPLIED)
Comparison: None.

CLINICAL DATA: Elevated D-dimer.

EXAM:
CT ANGIOGRAPHY CHEST WITH CONTRAST
TECHNIQUE: Multidetector CT imaging of the chest was performed using the
standard protocol during bolus administration of intravenous
contrast. Multiplanar CT image reconstructions and MIPs were
obtained to evaluate the vascular anatomy.
CONTRAST:  100mL OMNIPAQUE IOHEXOL 350 MG/ML SOLN

[Series 6: thins · axial · 0.86mm/px · z∈[-618,-346]mm · 16 of 298 slices shown]
[im 13/298  lung]
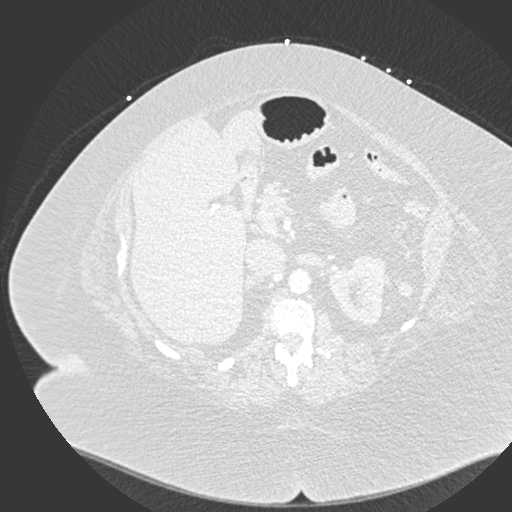
[im 39/298  soft-tissue]
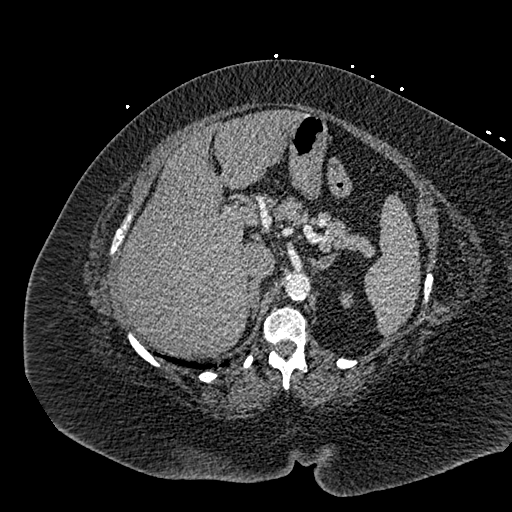
[im 52/298  lung]
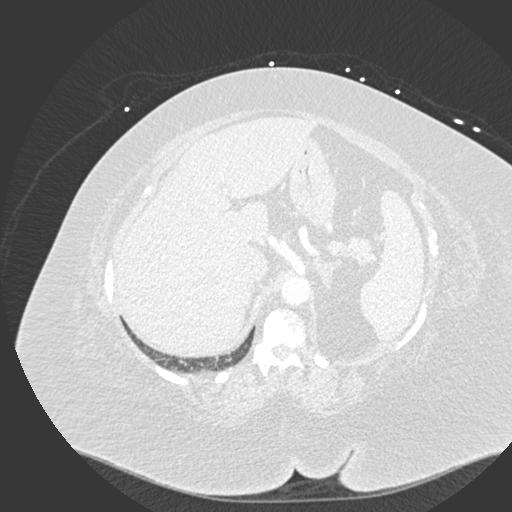
[im 65/298  soft-tissue]
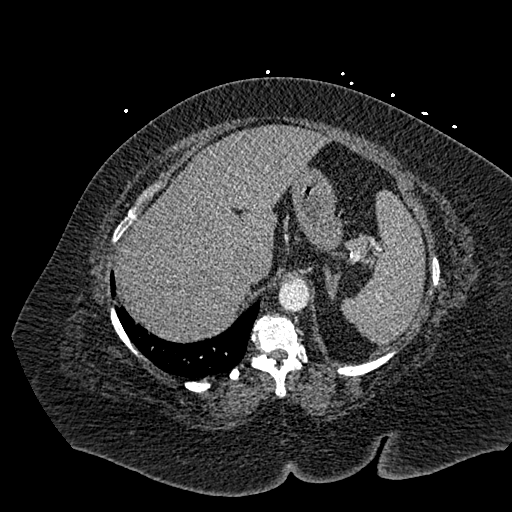
[im 91/298  lung]
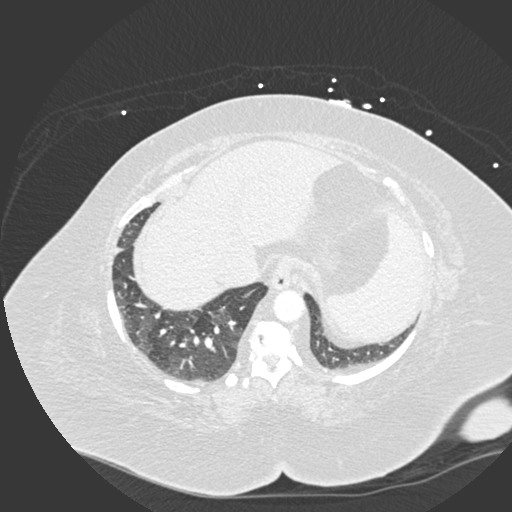
[im 104/298  soft-tissue]
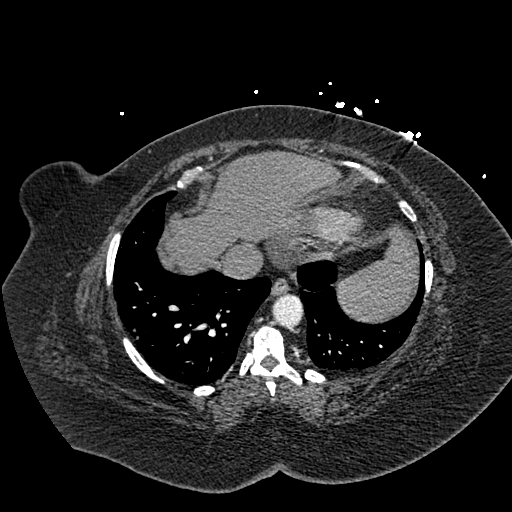
[im 117/298  lung]
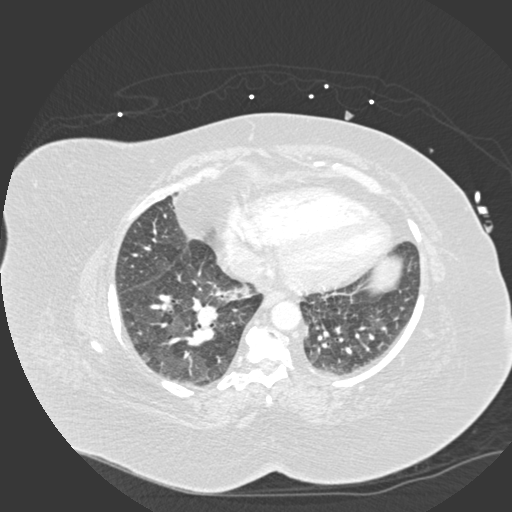
[im 143/298  soft-tissue]
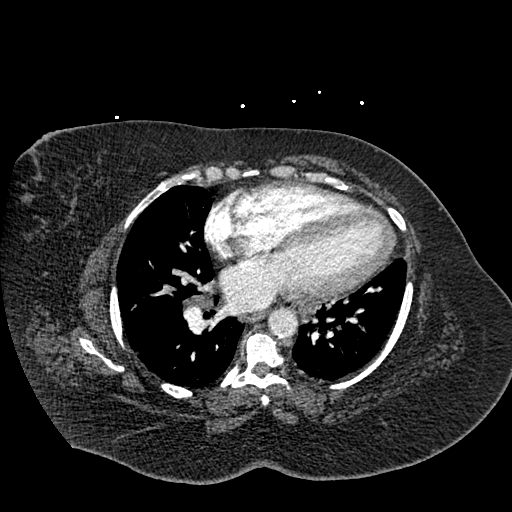
[im 155/298  lung]
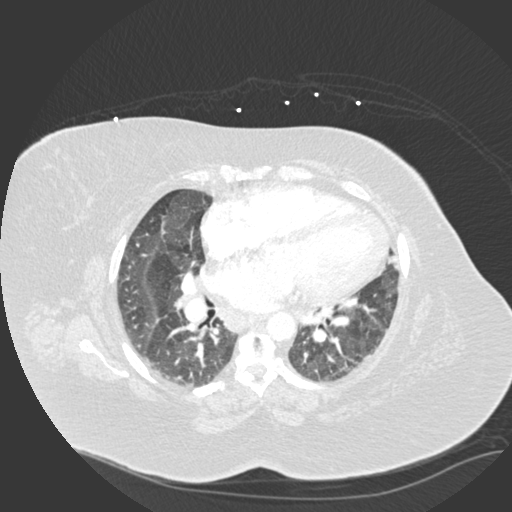
[im 181/298  soft-tissue]
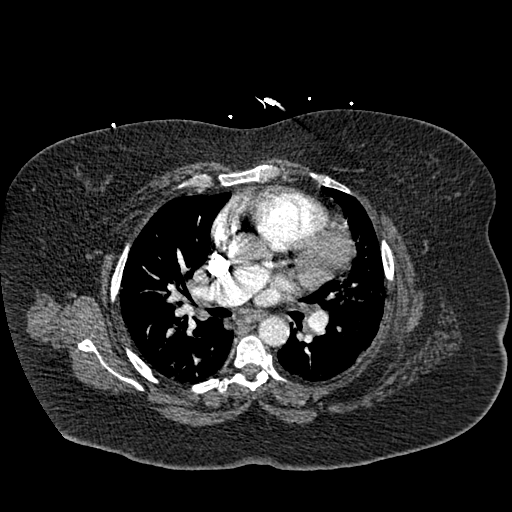
[im 194/298  lung]
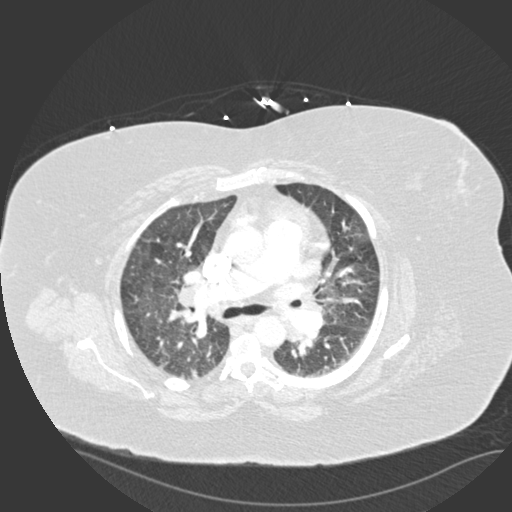
[im 207/298  soft-tissue]
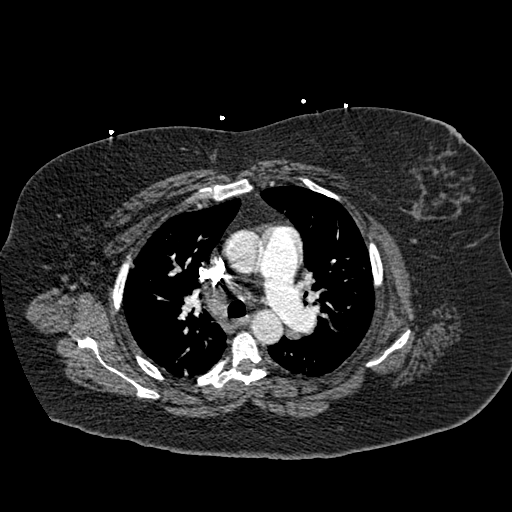
[im 233/298  lung]
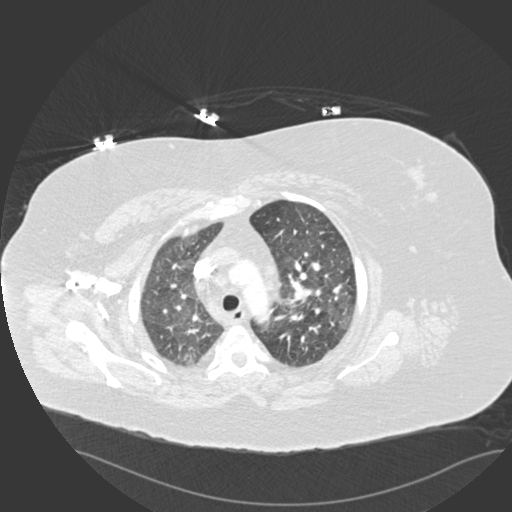
[im 246/298  soft-tissue]
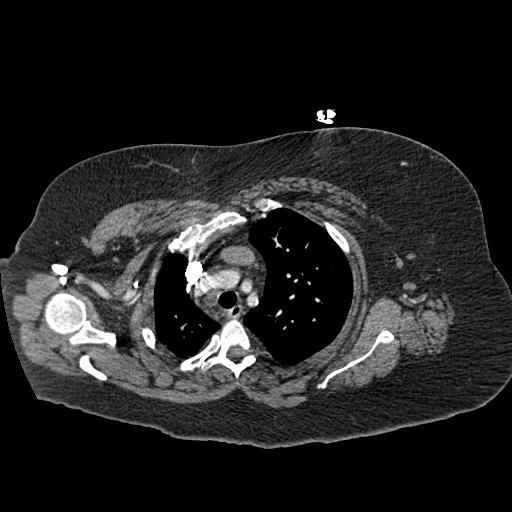
[im 259/298  lung]
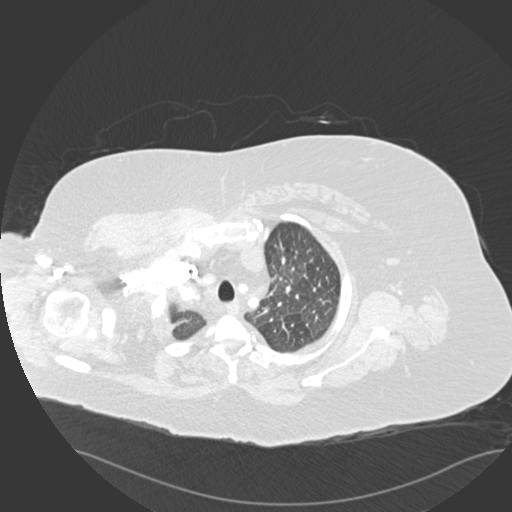
[im 285/298  soft-tissue]
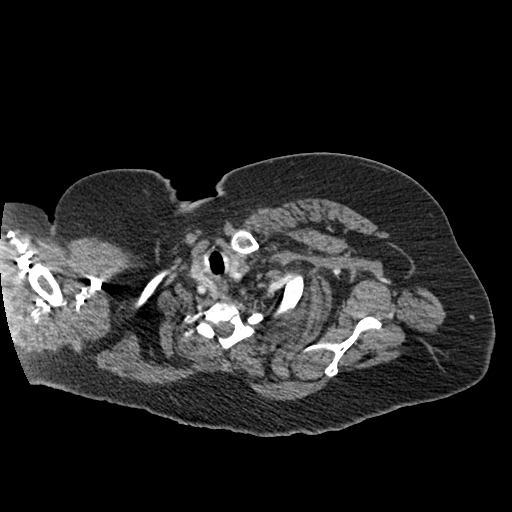

[Series 8: coronal mpr · coronal · 0.61mm/px · 2 of 116 slices shown]
[im 39/116  soft-tissue]
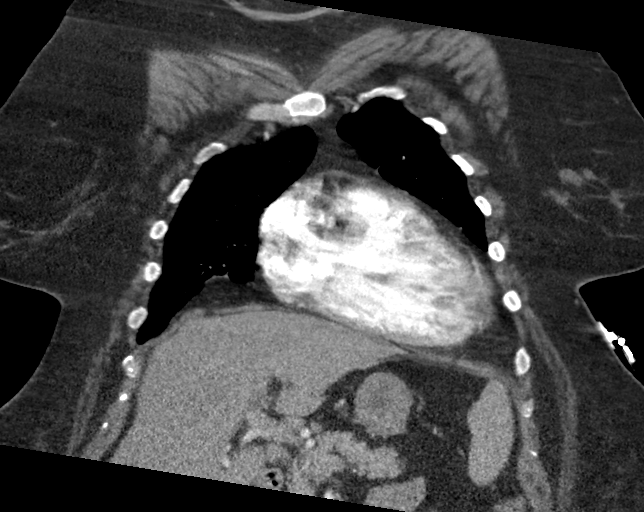
[im 77/116  soft-tissue]
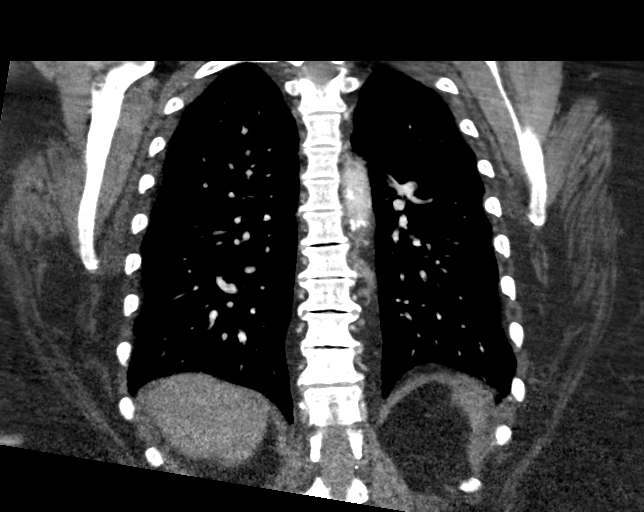

[18 of 46 positions shown; findings below may reference images not displayed]

FINDINGS: Cardiovascular: There is moderate severity calcification of the
aortic arch. Satisfactory opacification of the pulmonary arteries to
the segmental level. No evidence of pulmonary embolism. Normal heart
size. No pericardial effusion.

Mediastinum/Nodes: There is mild right hilar and AP window
lymphadenopathy. Thyroid gland, trachea, and esophagus demonstrate
no significant findings.

Lungs/Pleura: A 6 mm noncalcified lung nodule versus focal scar seen
within the posterior aspect of the left apex.

An 8 mm noncalcified lung nodule is seen within the anterolateral
aspect of the right upper lobe.

A 6 mm posterior right upper lobe noncalcified lung nodule is seen.

Adjacent 6 mm noncalcified lung nodules are seen along the
posterolateral aspect of the right lower lobe.

There is no evidence of acute infiltrate, pleural effusion or
pneumothorax.

Upper Abdomen: No acute abnormality.

Musculoskeletal: No chest wall abnormality. No acute or significant
osseous findings.

Review of the MIP images confirms the above findings.
IMPRESSION: 1. No evidence of pulmonary embolism.
2. Multiple bilateral noncalcified lung nodules. Correlation with
follow-up chest CT is recommended in 3-6 months.
3. Aortic atherosclerosis.

Aortic Atherosclerosis ([id number]).

## 2022-05-06 IMAGING — DX DG CHEST 1V PORT
1 series · 1 of 1 positions shown · non-contrast
Comparison: Chest radiograph from one day prior.

CLINICAL DATA: Dyspnea, CHF

EXAM:
PORTABLE CHEST 1 VIEW

[chest ap]
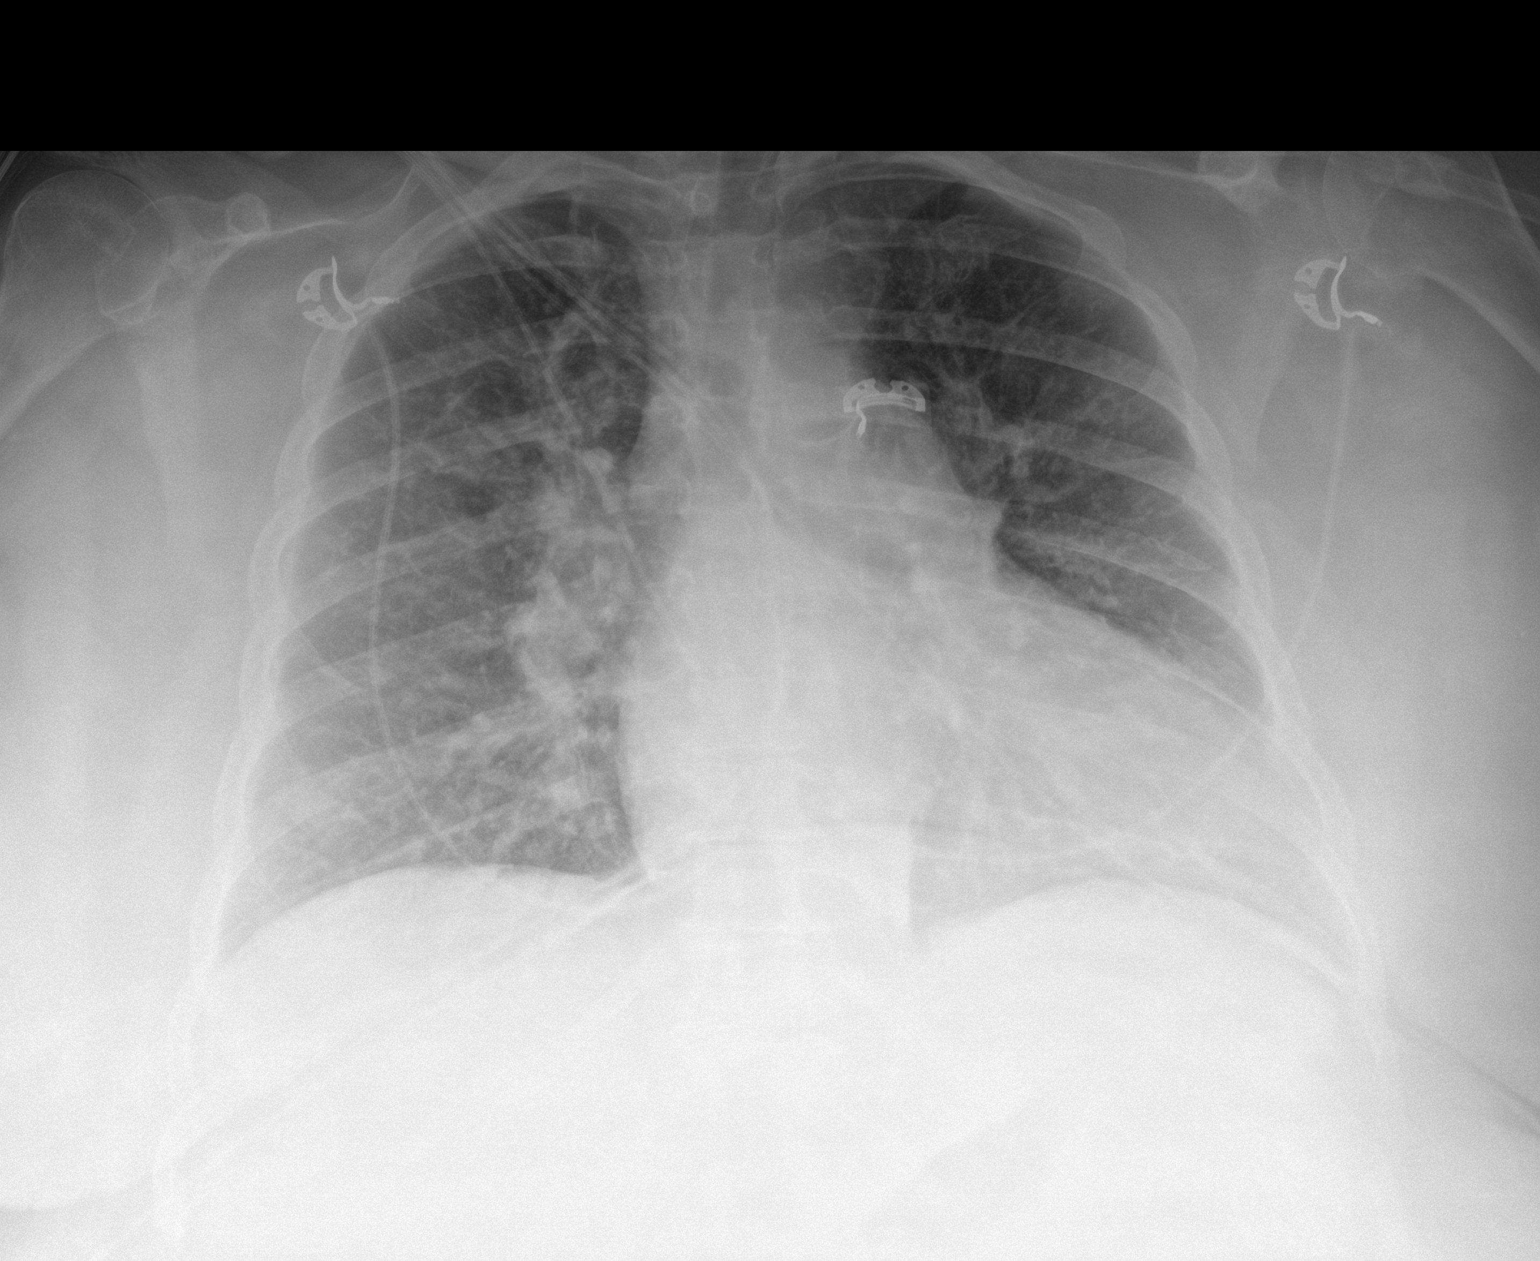

[1 of 1 positions shown; findings below may reference images not displayed]

FINDINGS: Stable cardiomediastinal silhouette with mild cardiomegaly. No
pneumothorax. No pleural effusion. Borderline mild pulmonary edema,
improved. No consolidative airspace disease.
IMPRESSION: Borderline mild congestive heart failure, improved.

## 2022-05-27 NOTE — Progress Notes (Signed)
*  PRELIMINARY RESULTS* Echocardiogram 2D Echocardiogram has been performed.  Carmen [name] 19-May-2022, 10:00 AM

## 2022-05-27 NOTE — Progress Notes (Signed)
CRITICAL CARE PROGRESS NOTE    Name: Carmen [name] MRN: [id number] DOB: [birth year]     LOS: 1   SUBJECTIVE FINDINGS & SIGNIFICANT EVENTS    Patient description:  This is a 66 year old female with a history of respiratory failure chronically with diastolic CHF and lymphedema who uses supplemental oxygen at home.  She also has CKD, uterine cancer and morbid obesity with a BMI over 50 she came into the hospital with acute on chronic hypoxemic respiratory failure and chest discomfort.  She was noted to be saturating less than 50% on arrival and required BiPAP NIV.  She does have DNR/DNI CODE STATUS.  She had VBG done which was acidemic and elevated troponin with elevated BNP suggestive of cardiac stress possible acute on chronic diastolic heart failure.  She was found to have acute COVID-19 infection.  PCCM consultation placed for further evaluation  05-26-2022-met with family and reviewed chest imaging she continues to do worse clinically with severe desaturation despite maximal settings on BiPAP.  She appears to be suffering and we discussed current treatment plan with DNR/DNI CODE STATUS.  After lengthy discourse goals of care were discussed with family wishing to proceed with comfort measures  Lines/tubes : Urethral Catheter C. Sosa RN Double-lumen 14 Fr. (Active)  Indication for Insertion or Continuance of Catheter Unstable critically ill patients first 24-48 hours (See Criteria) 05/26/2022 0817  Site Assessment Red;Bleeding 04/11/2022 2110  Catheter Maintenance Bag below level of bladder;Catheter secured;Drainage bag/tubing not touching floor;No dependent loops;Seal intact 05/26/22 0817  Collection Container Standard drainage bag May 26, 2022 0817  Securement Method Securing device (Describe) May 26, 2022 0817  Urinary Catheter  Interventions (if applicable) Unclamped 54/65/68 0817  Output (mL) 30 mL 05-26-22 0900    Microbiology/Sepsis markers: Results for orders placed or performed during the hospital encounter of 04/23/2022  MRSA Next Gen by PCR, Nasal     Status: None   Collection Time: 04/07/2022  6:20 PM   Specimen: Nasal Mucosa; Nasal Swab  Result Value Ref Range Status   MRSA by PCR Next Gen NOT DETECTED NOT DETECTED Final    Comment: (NOTE) The GeneXpert MRSA Assay (FDA approved for NASAL specimens only), is one component of a comprehensive MRSA colonization surveillance program. It is not intended to diagnose MRSA infection nor to guide or monitor treatment for MRSA infections. Test performance is not FDA approved in patients less than 38 years old. Performed at [facility], [address] [place], [place] [address]   SARS Coronavirus 2 by RT PCR (hospital order, performed in [facility] hospital lab) *cepheid single result test* Nasal Mucosa     Status: Abnormal   Collection Time: 04/19/2022  7:00 PM   Specimen: Nasal Mucosa; Nasal Swab  Result Value Ref Range Status   SARS Coronavirus 2 by RT PCR POSITIVE (A) NEGATIVE Final    Comment: (NOTE) SARS-CoV-2 target nucleic acids are DETECTED  SARS-CoV-2 RNA is generally detectable in upper respiratory specimens  during the acute phase of infection.  Positive results are indicative  of the presence of the identified virus, but do not rule out bacterial infection or co-infection with other pathogens not detected by the test.  Clinical correlation with patient history and  other diagnostic information is necessary to determine patient infection status.  The expected result is negative.  Fact Sheet for Patients:   https://www.patel.info/   Fact Sheet for Healthcare Providers:   https://hall.com/    This test is not yet approved or cleared by the [place]  States FDA and  has been authorized for  detection and/or diagnosis of SARS-CoV-2 by FDA under an Emergency Use Authorization (EUA).  This EUA will remain in effect (meaning this test can be used) for the duration of  the COVID-19 declaration under Section 564(b)(1)  of the Act, 21 U.S.C. section 360-bbb-3(b)(1), unless the authorization is terminated or revoked sooner.   Performed at [facility], [address] [place], [place] [address]     Anti-infectives:  Anti-infectives (From admission, onward)    Start     Dose/Rate Route Frequency Ordered Stop   04/28/22 1000  remdesivir 100 mg in sodium chloride 0.9 % 100 mL IVPB       See Hyperspace for full Linked Orders Report.   100 mg 200 mL/hr over 30 Minutes Intravenous Daily 2022-05-13 1021 04/30/22 0959   05/13/22 1200  remdesivir 200 mg in sodium chloride 0.9% 250 mL IVPB       See Hyperspace for full Linked Orders Report.   200 mg 580 mL/hr over 30 Minutes Intravenous Once 05-13-2022 1021            PAST MEDICAL HISTORY   Past Medical History:  Diagnosis Date   CHF (congestive heart failure) (HCC)    Endometrial cancer (HCC)    Grade 1   Hypertension    Lymphedema    Morbid obesity (HCC)    Osteoarthritis of both knees      SURGICAL HISTORY   Past Surgical History:  Procedure Laterality Date   ROBOTIC ASSISTED LAP VAGINAL HYSTERECTOMY  01/10/2011   BSO     FAMILY HISTORY   No family history on file.   SOCIAL HISTORY   Social History   Tobacco Use   Smoking status: Never   Smokeless tobacco: Never  Substance Use Topics   Alcohol use: No   Drug use: No     MEDICATIONS   Current Medication:  Current Facility-Administered Medications:    acetaminophen (TYLENOL) suppository 650 mg, 650 mg, Rectal, Q6H PRN, Ivor Costa, MD   acetaminophen (TYLENOL) tablet 650 mg, 650 mg, Oral, Q6H PRN, Ivor Costa, MD   acetylcysteine (MUCOMYST) 20 % nebulizer / oral solution 4 mL, 4 mL, Nebulization, Q4H, Finneus Kaneshiro, MD, 4 mL at  05/13/22 1126   albuterol (PROVENTIL) (2.5 MG/3ML) 0.083% nebulizer solution 2.5 mg, 2.5 mg, Nebulization, Q4H PRN, Ivor Costa, MD   aspirin chewable tablet 324 mg, 324 mg, Oral, Once, Ivor Costa, MD   aspirin EC tablet 81 mg, 81 mg, Oral, Daily, Ivor Costa, MD   atorvastatin (LIPITOR) tablet 40 mg, 40 mg, Oral, Daily, Ivor Costa, MD   chlorhexidine (PERIDEX) 0.12 % solution 15 mL, 15 mL, Mouth Rinse, BID, Ivor Costa, MD, 15 mL at 05/13/22 0745   Chlorhexidine Gluconate Cloth 2 % PADS 6 each, 6 each, Topical, Daily, Ivor Costa, MD   dexamethasone (DECADRON) injection 6 mg, 6 mg, Intravenous, Q24H, Madueme, Elvira C, RPH, 6 mg at 05-13-22 1144   dextromethorphan-guaiFENesin (MUCINEX DM) 30-600 MG per 12 hr tablet 1 tablet, 1 tablet, Oral, BID PRN, Ivor Costa, MD   furosemide (LASIX) injection 40 mg, 40 mg, Intravenous, Q12H, Ivor Costa, MD, 40 mg at 05-13-22 0745   [COMPLETED] heparin bolus via infusion 4,000 Units, 4,000 Units, Intravenous, Once, 4,000 Units at 04/09/2022 1713 **FOLLOWED BY** heparin ADULT infusion 100 units/mL (25000 units/237m), 1,050 Units/hr, Intravenous, Continuous, CBenita Gutter RPH, Last Rate: 10.5 mL/hr at 02023-06-171100, 1,050 Units/hr at 0June 17, 20231100   hydrALAZINE (  APRESOLINE) injection 5 mg, 5 mg, Intravenous, Q2H PRN, Ivor Costa, MD   ipratropium-albuterol (DUONEB) 0.5-2.5 (3) MG/3ML nebulizer solution 3 mL, 3 mL, Nebulization, Q4H, Ivor Costa, MD, 3 mL at May 24, 2022 1126   MEDLINE mouth rinse, 15 mL, Mouth Rinse, BID, Ivor Costa, MD, 15 mL at 05/24/22 0902   morphine (PF) 2 MG/ML injection 2 mg, 2 mg, Intravenous, Q4H PRN, Ivor Costa, MD, 2 mg at 24-May-2022 0901   ondansetron (ZOFRAN) injection 4 mg, 4 mg, Intravenous, Q8H PRN, Ivor Costa, MD   remdesivir 200 mg in sodium chloride 0.9% 250 mL IVPB, 200 mg, Intravenous, Once **FOLLOWED BY** [START ON 04/28/2022] remdesivir 100 mg in sodium chloride 0.9 % 100 mL IVPB, 100 mg, Intravenous, Daily, Ottie Glazier,  MD  Facility-Administered Medications Ordered in Other Encounters:    perflutren lipid microspheres (DEFINITY) IV suspension, 1-10 mL, Intravenous, PRN, Paraschos, Alexander, MD, 2 mL at 24-May-2022 0953    ALLERGIES   Clarithromycin and Penicillins    REVIEW OF SYSTEMS     Unable to obtain ROS due to critically ill status on BiPAP  PHYSICAL EXAMINATION   Vital Signs: Temp:  [97.1 F (36.2 C)-98.4 F (36.9 C)] 97.1 F (36.2 C) (06/01 1131) Pulse Rate:  [57-90] 78 (06/01 1131) Resp:  [14-28] 28 (06/01 1131) BP: (80-172)/(45-91) 121/65 (06/01 1100) SpO2:  [68 %-88 %] 85 % (06/01 1131) FiO2 (%):  [100 %] 100 % (06/01 1126) Weight:  [187.6 kg-188.9 kg] 187.6 kg (06/01 0215)  GENERAL:morbidly obese on BIPAP age appropriate HEAD: Normocephalic, atraumatic.  EYES: Pupils equal, round, non reactive 57m MOUTH: Moist mucosal membrane. NECK: Supple. No thyromegaly. No nodules. No JVD.  PULMONARY: decreased bs bilaterally CARDIOVASCULAR: S1 and S2. Regular rate and rhythm. No murmurs, rubs, or gallops.  GASTROINTESTINAL: Soft, nontender, non-distended. No masses. Positive bowel sounds. No hepatosplenomegaly.  MUSCULOSKELETAL: No swelling, clubbing, or edema.  NEUROLOGIC: Mild distress due to acute illness SKIN:intact,warm,dry   PERTINENT DATA     Infusions:  heparin 1,050 Units/hr (006/28/20231100)   remdesivir 200 mg in sodium chloride 0.9% 250 mL IVPB     Followed by   [Derrill MemoON 04/28/2022] remdesivir 100 mg in NS 100 mL     Scheduled Medications:  acetylcysteine  4 mL Nebulization Q4H   aspirin  324 mg Oral Once   aspirin EC  81 mg Oral Daily   atorvastatin  40 mg Oral Daily   chlorhexidine  15 mL Mouth Rinse BID   Chlorhexidine Gluconate Cloth  6 each Topical Daily   dexamethasone (DECADRON) injection  6 mg Intravenous Q24H   furosemide  40 mg Intravenous Q12H   ipratropium-albuterol  3 mL Nebulization Q4H   mouth rinse  15 mL Mouth Rinse BID   PRN  Medications: acetaminophen, acetaminophen, albuterol, dextromethorphan-guaiFENesin, hydrALAZINE, morphine injection, ondansetron (ZOFRAN) IV Hemodynamic parameters:   Intake/Output: 05/31 0701 - 06/01 0700 In: 183.3 [I.V.:183.3] Out: 185 [Urine:185]  Ventilator  Settings: FiO2 (%):  [100 %] 100 %    LAB RESULTS:  Basic Metabolic Panel: Recent Labs  Lab 04/18/2022 0723 [id number]  NA 139 143  K 4.5 5.1  CL 97* 103  CO2 33* 33*  GLUCOSE 181* 152*  BUN 32* 38*  CREATININE 1.08* 1.33*  CALCIUM 8.4* 8.2*  MG  --  2.4    Liver Function Tests: Recent Labs  Lab [id number]  AST 243*  ALT 99*  ALKPHOS 91  BILITOT 0.9  PROT 7.3  ALBUMIN 2.6*    No  results for input(s): LIPASE, AMYLASE in the last 168 hours. No results for input(s): AMMONIA in the last 168 hours. CBC: Recent Labs  Lab 04/13/2022 0654 May 02, 2022 0423  WBC 6.7 7.5  NEUTROABS 5.0  --   HGB 12.3 12.0  HCT 42.3 40.4  MCV 101.7* 100.5*  PLT 195 196    Cardiac Enzymes: No results for input(s): CKTOTAL, CKMB, CKMBINDEX, TROPONINI in the last 168 hours. BNP: Invalid input(s): POCBNP CBG: Recent Labs  Lab 04/07/2022 1812 2022/05/02 0744  GLUCAP 170* 146*        IMAGING RESULTS:  Imaging: CT HEAD WO CONTRAST (5MM)  Result Date: 04/07/2022 CLINICAL DATA:  Altered mental status. EXAM: CT HEAD WITHOUT CONTRAST TECHNIQUE: Contiguous axial images were obtained from the base of the skull through the vertex without intravenous contrast. RADIATION DOSE REDUCTION: This exam was performed according to the departmental dose-optimization program which includes automated exposure control, adjustment of the mA and/or kV according to patient size and/or use of iterative reconstruction technique. COMPARISON:  CT October 24, 2020 FINDINGS: Despite efforts by the technologist and patient, motion artifact is present on today's exam and could not be eliminated. This reduces exam sensitivity and specificity. Brain: No  evidence of acute infarction, hemorrhage, hydrocephalus, extra-axial collection or mass lesion/mass effect. Vascular: No hyperdense vessel or unexpected calcification. Skull: Normal. Negative for fracture or focal lesion. Sinuses/Orbits: No acute finding. Other: None. IMPRESSION: No acute intracranial process on this motion degraded examination. Electronically Signed   By: Dahlia Bailiff M.D.   On: 04/03/2022 14:48   DG Chest Portable 1 View  Addendum Date: 04/12/2022   ADDENDUM REPORT: 04/06/2022 07:00 ADDENDUM: Study discussed by telephone with Dr. Rudene Re on 04/06/2022 at 0657 hours. She advises the patient is DNR/DNI, with agonal respiration. Electronically Signed   By: Genevie Ann M.D.   On: 04/16/2022 07:00   Result Date: 04/15/2022 CLINICAL DATA:  66 year old female unresponsive, respiratory distress. Endometrial cancer, recent hospice. EXAM: PORTABLE CHEST 1 VIEW COMPARISON:  Portable chest 10/27/2020 and earlier. FINDINGS: Portable AP upright view at 0637 hours. White out of the left hemithorax. Mediastinal contours obscured. Some tracheal air column is visible and appears clear. Subtotal opacification of the contralateral right lung also, only some right lung markings are visible. No acute osseous abnormality identified. Visible ribs appear intact. Paucity bowel gas in the upper abdomen. IMPRESSION: Nonspecific white out of the lungs left > right. Mediastinal contours obscured. Some tracheal air is visible. Electronically Signed: By: Genevie Ann M.D. On: 04/17/2022 06:51   _0 @ CT HEAD WO CONTRAST (5MM)  Result Date: 03/30/2022 CLINICAL DATA:  Altered mental status. EXAM: CT HEAD WITHOUT CONTRAST TECHNIQUE: Contiguous axial images were obtained from the base of the skull through the vertex without intravenous contrast. RADIATION DOSE REDUCTION: This exam was performed according to the departmental dose-optimization program which includes automated exposure control, adjustment of the mA  and/or kV according to patient size and/or use of iterative reconstruction technique. COMPARISON:  CT October 24, 2020 FINDINGS: Despite efforts by the technologist and patient, motion artifact is present on today's exam and could not be eliminated. This reduces exam sensitivity and specificity. Brain: No evidence of acute infarction, hemorrhage, hydrocephalus, extra-axial collection or mass lesion/mass effect. Vascular: No hyperdense vessel or unexpected calcification. Skull: Normal. Negative for fracture or focal lesion. Sinuses/Orbits: No acute finding. Other: None. IMPRESSION: No acute intracranial process on this motion degraded examination. Electronically Signed   By: Dahlia Bailiff M.D.   On: 04/25/2022 14:48  ASSESSMENT AND PLAN    -Multidisciplinary rounds held today  Acute on chronic hypoxic Respiratory Failure Due to acute COVID-19 infection Patient is chronically with hypoxemia due to CHF and OHS overlap -Sinew BiPAP for now -decadron 71m IV daily  -Diuresis - Lasix 40 IV daily - monitor UOP - utilize external urinary catheter if possible -Self prone if patient can tolerate  -encourage to use IS and Acapella device for bronchopulmonary hygiene when able -consider Actemra if CRP increments >20 -d/c hepatotoxic medications while on remdesevir -supportive care with ICU telemetry monitoring -PT/OT when possible -procalcitonin, CRP and ferritin trending    Acute on chronic chronic diastolic CHF -oxygen as needed -Lasix as tolerated -follow up cardiac enzymes as indicated ICU monitoring  Chronic renal Failure- -follow chem 7 -follow UO -continue Foley Catheter-assess need daily   ID -continue IV abx as prescibed -follow up cultures  GI/Nutrition GI PROPHYLAXIS as indicated DIET-->TF's as tolerated Constipation protocol as indicated  ENDO - ICU hypoglycemic\Hyperglycemia protocol -check FSBS per protocol   ELECTROLYTES -follow labs as needed -replace as  needed -pharmacy consultation   DVT/GI PRX ordered -SCDs  TRANSFUSIONS AS NEEDED MONITOR FSBS ASSESS the need for LABS as needed   Critical care provider statement:   Total critical care time: 33 minutes   Performed by: ALanney GinsMD   Critical care time was exclusive of separately billable procedures and treating other patients.   Critical care was necessary to treat or prevent imminent or life-threatening deterioration.   Critical care was time spent personally by me on the following activities: development of treatment plan with patient and/or surrogate as well as nursing, discussions with consultants, evaluation of patient's response to treatment, examination of patient, obtaining history from patient or surrogate, ordering and performing treatments and interventions, ordering and review of laboratory studies, ordering and review of radiographic studies, pulse oximetry and re-evaluation of patient's condition.    FOttie Glazier M.D.  Pulmonary & Critical Care Medicine

## 2022-05-27 NOTE — Consult Note (Signed)
ANTICOAGULATION CONSULT NOTE  Pharmacy Consult for IV Heparin Indication: chest pain/ACS  Patient Measurements: Height: '5\' 1"'$  (154.9 cm) Weight: (!) 187.6 kg (413 lb 9.3 oz) IBW/kg (Calculated) : 47.8 Heparin Dosing Weight: 82.9 kg  Labs: Recent Labs    04/13/2022 0654 04/06/2022 0723 03/30/2022 0758 04/09/2022 1101 03/29/2022 1709 04/21/2022 2252 May 25, 2022 0423  HGB 12.3  --   --   --   --   --  12.0  HCT 42.3  --   --   --   --   --  40.4  PLT 195  --   --   --   --   --  196  APTT  --   --   --   --  35  --   --   LABPROT  --   --   --   --  16.2*  --   --   INR  --   --   --   --  1.3*  --   --   HEPARINUNFRC  --   --   --   --   --  0.54 0.56  CREATININE  --  1.08*  --   --   --   --  1.33*  TROPONINIHS 67*  --  863* 4,052*  --   --   --      Estimated Creatinine Clearance: 66.3 mL/min (A) (by C-G formula based on SCr of 1.33 mg/dL (H)).   Medical History: Past Medical History:  Diagnosis Date   CHF (congestive heart failure) (HCC)    Endometrial cancer (HCC)    Grade 1   Hypertension    Lymphedema    Morbid obesity ([place])    Osteoarthritis of both knees     Medications:  No anticoagulation prior to admission per my chart review  Assessment: Patient is a 66 y/o F with medical history as above who was BIBEMS from home with respiratory distress. Recently removed from hospice care. Troponin elevated and trending up. Pharmacy consulted to initiate and manage heparin infusion for suspected ACS.  Baseline CBC within normal limits. Baseline aPTT and PT-INR are pending.  Goal of Therapy:  Heparin level 0.3-0.7 units/ml Monitor platelets by anticoagulation protocol: Yes  5/31 2252 HL 0.54, therapeutic x 1 6/01 0423 HL 0.56, therapeutic x 2   Plan:  --Continue heparin infusion at 1050 units/hr --Recheck HL daily w/ AM labs while therapeutic --Daily CBC per protocol while on IV heparin  Renda Rolls, PharmD, [facility] 05/25/22 5:35 AM

## 2022-05-27 NOTE — Hospital Course (Addendum)
66 year old female with past medical history of diastolic CHF, morbid obesity, stage III chronic kidney disease, paroxysmal atrial fibrillation on Eliquis, diastolic CHF and chronic respiratory failure on 5 L nasal cannula presented to the emergency room on 5/31 in acute respiratory distress with oxygen saturations of 23% on nonrebreather saturations of 70 to 80% on BiPAP.  Patient was minimally responsive and with agonal breathing.  Chest x-ray revealed nonspecific bilateral white out left greater than right.  Troponin levels initially at 67 climbed to the 4000 indicating non-STEMI.  Patient felt to have acute heart failure with BNP at 820 although likely much higher given patient's BMI of 78.  In discussion of goals of care, family wanted patient to be treated aggressively but if there is further deterioration consideration of comfort care.  Pulmonary, cardiology and palliative care were consulted.  Patient started on heparin drip and IV Lasix.  By following day, patient with very little urine output.  At times she would be intermittently responsive, but overall remained hypoxic, unable to breathe independently or protect her airway or mobilize secretions.  Critical care had extensive discussion with patient's family I & D it was felt best to make her comfort care.  The patient expired 1:09 PM on 6/1.

## 2022-05-27 NOTE — Death Summary Note (Signed)
DEATH SUMMARY   Patient Details  Name: Carmen [name] MRN: [id number] DOB: July 02, 1956 JHE:RDEY, Almyra Free, NP Admission/Discharge Information   Admit Date:  May 07, 2022  Date of Death: Date of Death: 2022-05-08  Time of Death: Time of Death: 71  Length of Stay: 1   Principle Cause of death: Acute on Chronic Diastolic CHF  Hospital Diagnoses: Principal Problem:   Acute on chronic respiratory failure with hypoxia and hypercapnia (HCC) Active Problems:   Acute on chronic diastolic CHF (congestive heart failure) ([place])   COVID-19 virus infection   NSTEMI (non-ST elevated myocardial infarction) ([place])   Acute metabolic encephalopathy   Hypertension   Depression   Chronic kidney disease, stage 3a ([place])   Morbid obesity ([place])   PAF (paroxysmal atrial fibrillation) [facility])   Hospital Course: 66 year old female with past medical history of diastolic CHF, morbid obesity, stage III chronic kidney disease, paroxysmal atrial fibrillation on Eliquis, diastolic CHF and chronic respiratory failure on 5 L nasal cannula presented to the emergency room on May 08, 2023 in acute respiratory distress with oxygen saturations of 23% on nonrebreather saturations of 70 to 80% on BiPAP.  Patient was minimally responsive and with agonal breathing.  Chest x-ray revealed nonspecific bilateral white out left greater than right.  Troponin levels initially at 67 climbed to the 4000 indicating non-STEMI.  Patient felt to have acute heart failure with BNP at 820 although likely much higher given patient's BMI of 78.  In discussion of goals of care, family wanted patient to be treated aggressively but if there is further deterioration consideration of comfort care.  Pulmonary, cardiology and palliative care were consulted.  Patient started on heparin drip and IV Lasix.  By following day, patient with very little urine output.  At times she would be intermittently responsive, but overall remained hypoxic, unable to breathe  independently or protect her airway or mobilize secretions.  Critical care had extensive discussion with patient's family I & D it was felt best to make her comfort care.  The patient expired 1:09 PM on 05/09/2023.  Assessment and Plan: * Acute on chronic respiratory failure with hypoxia and hypercapnia (HCC) Felt to be secondary to acute on chronic diastolic CHF which contributed to non-STEMI versus caused by non-STEMI. BNP 280. CXR showed nonspecific bilateral whiteout.  Although attempts were made to diurese, patient had little response to this and with no hope of meaningful recovery, she was made comfort care and then expired.  Acute on chronic diastolic CHF (congestive heart failure) ([place]) 2D echo 09/27/2020 showed EF of 55 to 60%.  As above.  COVID-19 virus infection Following admission, patient was checked for COVID and found to be positive.  Isolation precautions were established.  NSTEMI (non-ST elevated myocardial infarction) (HCC) Troponin trended upward to 4000.  Cardiology consulted and patient put on IV heparin.  Initial plan for cardiac stabilization deferred until patient was more stable.  Then canceled when she was made comfort care.  Acute metabolic encephalopathy Secondary to hypoxia.  CT head negative.   Hypertension Blood pressure has been soft, secondary to decompensated heart failure.  Home blood pressure medications held.  She had minimal response to Lasix.    Depression - Hold oral medications due to altered mental status.  Chronic kidney disease, stage 3a ([place]) At baseline at presentation  Morbid obesity ([place]) Patient's previous BMI in the 50s and on admission BMI was 78, in part due to significant volume overload from fluid.   PAF (paroxysmal atrial fibrillation) (HCC) Blood pressure  soft with a heart rate ranging 60s to 80s. -Placed on IV heparin, and hold Eliquis -Held home amiodarone, Coreg, Cardizem, metoprolol (patient is not taking these medications  currently)         Procedures: Echocardiogram done 6/1: Indeterminate diastolic function.  Preserved ejection fraction  Consultations:  -Cardiology -Critical care -Palliative care  The results of significant diagnostics from this hospitalization (including imaging, microbiology, ancillary and laboratory) are listed below for reference.   Significant Diagnostic Studies: CT HEAD WO CONTRAST (5MM)  Result Date: 04/24/2022 CLINICAL DATA:  Altered mental status. EXAM: CT HEAD WITHOUT CONTRAST TECHNIQUE: Contiguous axial images were obtained from the base of the skull through the vertex without intravenous contrast. RADIATION DOSE REDUCTION: This exam was performed according to the departmental dose-optimization program which includes automated exposure control, adjustment of the mA and/or kV according to patient size and/or use of iterative reconstruction technique. COMPARISON:  CT October 24, 2020 FINDINGS: Despite efforts by the technologist and patient, motion artifact is present on today's exam and could not be eliminated. This reduces exam sensitivity and specificity. Brain: No evidence of acute infarction, hemorrhage, hydrocephalus, extra-axial collection or mass lesion/mass effect. Vascular: No hyperdense vessel or unexpected calcification. Skull: Normal. Negative for fracture or focal lesion. Sinuses/Orbits: No acute finding. Other: None. IMPRESSION: No acute intracranial process on this motion degraded examination. Electronically Signed   By: Dahlia Bailiff M.D.   On: 04/02/2022 14:48   DG Chest Portable 1 View  Addendum Date: 04/17/2022   ADDENDUM REPORT: 04/24/2022 07:00 ADDENDUM: Study discussed by telephone with Dr. Rudene Re on 04/03/2022 at 0657 hours. She advises the patient is DNR/DNI, with agonal respiration. Electronically Signed   By: Genevie Ann M.D.   On: 04/20/2022 07:00   Result Date: 04/06/2022 CLINICAL DATA:  66 year old female unresponsive, respiratory distress.  Endometrial cancer, recent hospice. EXAM: PORTABLE CHEST 1 VIEW COMPARISON:  Portable chest 10/27/2020 and earlier. FINDINGS: Portable AP upright view at 0637 hours. White out of the left hemithorax. Mediastinal contours obscured. Some tracheal air column is visible and appears clear. Subtotal opacification of the contralateral right lung also, only some right lung markings are visible. No acute osseous abnormality identified. Visible ribs appear intact. Paucity bowel gas in the upper abdomen. IMPRESSION: Nonspecific white out of the lungs left > right. Mediastinal contours obscured. Some tracheal air is visible. Electronically Signed: By: Genevie Ann M.D. On: 04/02/2022 06:51   ECHOCARDIOGRAM COMPLETE  Result Date: 30-Apr-2022    ECHOCARDIOGRAM REPORT   Patient Name:   JAMIE-LEE GALDAMEZ Date of Exam: 04-30-22 Medical Rec #:  [id number]       Height:       61.0 in Accession #:    [id number]      Weight:       413.6 lb Date of Birth:  1956-11-04       BSA:          2.572 m Patient Age:    66 years        BP:           103/60 mmHg Patient Gender: F               HR:           74 bpm. Exam Location:  [facility] Procedure: 2D Echo, Limited Color Doppler, Cardiac Doppler and Intracardiac            Opacification Agent Indications:     I50.9 Congestive Heart Failure  History:  Patient has prior history of Echocardiogram examinations. Risk                  Factors:Hypertension. Pt tested positive for COVID-19 on                  04/23/2022.  Sonographer:     Charmayne Sheer Referring Phys:  [place] Diagnosing Phys: Isaias Cowman MD  Sonographer Comments: Technically challenging study due to limited acoustic windows and patient is morbidly obese. Pt on BIPAP. IMPRESSIONS  1. Left ventricular ejection fraction, by estimation, is 60 to 65%. The left ventricle has normal function. The left ventricle has no regional wall motion abnormalities. Left ventricular diastolic parameters are indeterminate.  2. Right  ventricular systolic function is normal. The right ventricular size is normal.  3. The mitral valve is normal in structure. Mild mitral valve regurgitation. No evidence of mitral stenosis.  4. The aortic valve is normal in structure. Aortic valve regurgitation is not visualized. No aortic stenosis is present.  5. The inferior vena cava is normal in size with greater than 50% respiratory variability, suggesting right atrial pressure of 3 mmHg. FINDINGS  Left Ventricle: Left ventricular ejection fraction, by estimation, is 60 to 65%. The left ventricle has normal function. The left ventricle has no regional wall motion abnormalities. Definity contrast agent was given IV to delineate the left ventricular  endocardial borders. The left ventricular internal cavity size was normal in size. There is no left ventricular hypertrophy. Left ventricular diastolic parameters are indeterminate. Right Ventricle: The right ventricular size is normal. No increase in right ventricular wall thickness. Right ventricular systolic function is normal. Left Atrium: Left atrial size was normal in size. Right Atrium: Right atrial size was normal in size. Pericardium: There is no evidence of pericardial effusion. Mitral Valve: The mitral valve is normal in structure. Mild mitral valve regurgitation. No evidence of mitral valve stenosis. Tricuspid Valve: The tricuspid valve is normal in structure. Tricuspid valve regurgitation is mild . No evidence of tricuspid stenosis. Aortic Valve: The aortic valve is normal in structure. Aortic valve regurgitation is not visualized. No aortic stenosis is present. Aortic valve mean gradient measures 10.0 mmHg. Aortic valve peak gradient measures 16.6 mmHg. Pulmonic Valve: The pulmonic valve was normal in structure. Pulmonic valve regurgitation is not visualized. No evidence of pulmonic stenosis. Aorta: The aortic root is normal in size and structure. Venous: The inferior vena cava is normal in size with  greater than 50% respiratory variability, suggesting right atrial pressure of 3 mmHg. IAS/Shunts: No atrial level shunt detected by color flow Doppler.   Diastology LV e' medial:  5.11 cm/s LV e' lateral: 6.53 cm/s  AORTIC VALVE AV Vmax:           204.00 cm/s AV Vmean:          149.000 cm/s AV VTI:            0.395 m AV Peak Grad:      16.6 mmHg AV Mean Grad:      10.0 mmHg LVOT Vmax:         122.00 cm/s LVOT Vmean:        88.400 cm/s LVOT VTI:          0.226 m LVOT/AV VTI ratio: 0.57  SHUNTS Systemic VTI: 0.23 m Isaias Cowman MD Electronically signed by Isaias Cowman MD Signature Date/Time: 05-04-2022/1:23:18 PM    Final     Microbiology: Recent Results (from the past 240 hour(s))  MRSA Next Gen by PCR, Nasal     Status: None   Collection Time: 04/20/2022  6:20 PM   Specimen: Nasal Mucosa; Nasal Swab  Result Value Ref Range Status   MRSA by PCR Next Gen NOT DETECTED NOT DETECTED Final    Comment: (NOTE) The GeneXpert MRSA Assay (FDA approved for NASAL specimens only), is one component of a comprehensive MRSA colonization surveillance program. It is not intended to diagnose MRSA infection nor to guide or monitor treatment for MRSA infections. Test performance is not FDA approved in patients less than 44 years old. Performed at [facility], [address] [place], [place] [address]   SARS Coronavirus 2 by RT PCR (hospital order, performed in [facility] hospital lab) *cepheid single result test* Nasal Mucosa     Status: Abnormal   Collection Time: 04/16/2022  7:00 PM   Specimen: Nasal Mucosa; Nasal Swab  Result Value Ref Range Status   SARS Coronavirus 2 by RT PCR POSITIVE (A) NEGATIVE Final    Comment: (NOTE) SARS-CoV-2 target nucleic acids are DETECTED  SARS-CoV-2 RNA is generally detectable in upper respiratory specimens  during the acute phase of infection.  Positive results are indicative  of the presence of the identified virus, but do not rule out bacterial  infection or co-infection with other pathogens not detected by the test.  Clinical correlation with patient history and  other diagnostic information is necessary to determine patient infection status.  The expected result is negative.  Fact Sheet for Patients:   https://www.patel.info/   Fact Sheet for Healthcare Providers:   https://hall.com/    This test is not yet approved or cleared by the [place] FDA and  has been authorized for detection and/or diagnosis of SARS-CoV-2 by FDA under an Emergency Use Authorization (EUA).  This EUA will remain in effect (meaning this test can be used) for the duration of  the COVID-19 declaration under Section 564(b)(1)  of the Act, 21 U.S.C. section 360-bbb-3(b)(1), unless the authorization is terminated or revoked sooner.   Performed at [facility], [address] [place], [place] [address]     Time spent: 25 minutes minutes  Signed: Annita Brod, MD 11-May-2022

## 2022-05-27 NOTE — Progress Notes (Signed)
Daily Progress Note   Patient Name: Carmen [name]       Date: 04/30/2022 DOB: Feb 03, 1956  Age: 66 y.o. MRN#: [id number] Attending Physician: Annita Brod, MD Primary Care Physician: Alvester Chou, NP Admit Date: 04/09/2022  Reason for Consultation/Follow-up: Establishing goals of care  Subjective: Patient poorly responsive but does briefly wake to voice, squeezes hands when asked  Length of Stay: 1  Current Medications: Scheduled Meds:   antiseptic oral rinse  15 mL Topical TID   ipratropium-albuterol  3 mL Nebulization Q4H    Continuous Infusions:  morphine Stopped (04-30-22 1310)    PRN Meds: glycopyrrolate **OR** glycopyrrolate **OR** glycopyrrolate, LORazepam, morphine injection, morphine, polyvinyl alcohol  Physical Exam Constitutional:      Appearance: She is ill-appearing.  Pulmonary:     Comments: Remains on BiPAP 100% FiO2 with sats in the 80s Skin:    General: Skin is warm and dry.  Neurological:     Mental Status: She is disoriented.            Vital Signs: BP 121/65 (BP Location: Left Wrist)   Pulse 70   Temp (!) 97.1 F (36.2 C) (Axillary)   Resp 19   Ht '5\' 1"'$  (1.549 m)   Wt (!) 187.6 kg   SpO2 (!) 45%   BMI 78.15 kg/m  SpO2: SpO2: (!) 45 % O2 Device: O2 Device: Bi-PAP O2 Flow Rate: O2 Flow Rate (L/min): 0 L/min  Intake/output summary:  Intake/Output Summary (Last 24 hours) at 04-30-22 1422 Last data filed at Apr 30, 2022 1310 Gross per 24 hour  Intake 247.14 ml  Output 425 ml  Net -177.86 ml   LBM: Last BM Date :  (prior to admit) Baseline Weight: Weight: (!) 137 kg Most recent weight: Weight: (!) 187.6 kg       Palliative Assessment/Data: PPS 20%    Flowsheet Rows    Flowsheet Row Most Recent Value  Intake Tab   Referral Department  Hospitalist  Unit at Time of Referral ER  Palliative Care Primary Diagnosis Pulmonary  Date Notified 03/31/2022  Palliative Care Type New Palliative care  Reason for referral Clarify Goals of Care  Date of Admission 04/16/2022  Date first seen by Palliative Care 04/25/2022  # of days Palliative referral response time 0 Day(s)  # of days IP prior to Palliative referral 0  Clinical Assessment   Psychosocial & Spiritual Assessment   Palliative Care Outcomes        Patient Active Problem List   Diagnosis Date Noted   Acute on chronic respiratory failure with hypoxia ([place]) 96/22/2979   Acute metabolic encephalopathy 89/21/1941   Acute on chronic diastolic CHF (congestive heart failure) ([place]) 04/10/2022   Depression 04/04/2022   PAF (paroxysmal atrial fibrillation) ([place]) 03/29/2022   Hypertension    Chronic kidney disease, stage 3a (HCC)    Insomnia    Confusion    Acute delirium    Acute respiratory failure with hypoxia and hypercapnia (HCC)    Lobar pneumonia (HCC)    Severe sepsis (HCC)    AF (paroxysmal atrial fibrillation) ([place])    Acute renal failure ([place]) 10/24/2020   Chronic acquired lymphedema 10/24/2020   Leukocytosis 10/24/2020  Thrombocytopenia ([place]) 10/24/2020   Anemia 10/24/2020   Acute on chronic respiratory failure with hypoxia and hypercapnia ([place]) 09/27/2020   Morbid obesity with BMI of 50.0-59.9, adult ([place]) 09/27/2020   Chronic diastolic CHF (congestive heart failure) ([place]) 09/27/2020   NSTEMI (non-ST elevated myocardial infarction) ([place]) 09/27/2020   Acute on chronic diastolic congestive heart failure ([place]) 09/27/2020   Acute CHF (congestive heart failure) ([place]) 09/27/2020   Uterine carcinoma ([place]) 01/30/2012   ABNORMAL GLANDULAR PAPANICOLAOU SMEAR OF CERVIX 10/10/2010   SHORTNESS OF BREATH 09/30/2010   KNEE PAIN, BILATERAL 09/02/2010    Palliative Care Assessment & Plan   HPI: 66 y.o. female  with past medical history of CHF, endometrial cancer,  hypertension, lymphedema, morbid obesity, chronic respiratory failure, and DNR/DNI admitted on 04/08/2022 with respiratory distress.  When patient arrived in the ER she was in severe respiratory distress, cyanotic, and satting 23% on a nonrebreather.  Chest x-ray showed complete whiteout of both lungs.  Patient placed on BiPAP and given IV Lasix.  Patient recently discharged from hospice care.  PMT consulted to discuss goals of care.  Assessment: Patient with no improvement in condition. Dr. Lanney Gins had conversation with family members at bedside and decision was made to focus on comfort only. Comfort orders placed including morphine infusion with boluses and Ativan IV.  Once medications were given to ensure comfort and relieve shortness of breath patient was freed from the BiPAP.  Remained at bedside to ensure comfort and answer family's questions and concerns.  Family aware patient likely to pass quickly.  Recommendations/Plan: Full comfort measures -morphine infusion initiated along with as needed morphine boluses and IV Ativan Freed from BiPAP to allow natural death and focus on comfort  Goals of Care and Additional Recommendations: Limitations on Scope of Treatment: Full Comfort Care  Code Status: DNR   Discharge Planning: Anticipated Hospital Death  Care plan was discussed with RN, Dr. Lanney Gins, patient's spouse and son  Thank you for allowing the Palliative Medicine Team to assist in the care of this patient.   *Please note that this is a verbal dictation therefore any spelling or grammatical errors are due to the "[employer] One" system interpretation.  Juel Burrow, DNP, [facility] Palliative Medicine Team Team Phone # [phone]  Pager [phone]

## 2022-05-27 NOTE — Progress Notes (Addendum)
GOALS OF CARE FAMILY CONFERENCE   Current clinical status, hospital findings and medical plan was reviewed with family.   Updated and notified of patients ongoing immediate critical medical problems.   Patient remains severely hypoxemic and unresponsive  Patient is unable to breathe independently, unable to protect airway and unable to mobilize secretions.    Explained to family course of therapy and the modalities   Patient with Progressive multiorgan failure with high probability of a very minimal chance of meaningful recovery despite aggressive and optimal medical therapy.   Family is appreciative of care and relate understanding that patient is severely critically ill with anticipation of passing away during this hospitalization.   They have consented and agreed to for comfort care measures  Family are satisfied with Plan of action and management. All questions answered  Additional Critical Care time 35 mins    Ottie Glazier, M.D.  Pulmonary & [place]

## 2022-05-27 NOTE — Progress Notes (Addendum)
0800 patient on BIPAP 100% SP02 in mid 80s patient not following commands not responding to pain husband at bedside states patient nods for him and follows commands. RN showed husband her assessment and no changes noted patient husband didn't seem concerned states he will return later today to call for any changes. Asked husband regarding eye surgeries patients pupils are a 9 and non-reactive patient husband denies surgeries to eyes, patient not an MRI candidate.  1000 echo completed 1045 palliative seen patient and patient following commands for her  1200 patient moaning "help me " family meeting agree to go comfort 1208 morphine gtt started with 2 mg bolus family at bedside  1245 bipap off placed on [place]

## 2022-05-27 NOTE — Consult Note (Signed)
[facility] for IV Heparin Indication: chest pain/ACS  Patient Measurements: Height: '5\' 1"'$  (154.9 cm) Weight: (!) 188.9 kg (416 lb 7.2 oz) IBW/kg (Calculated) : 47.8 Heparin Dosing Weight: 82.9 kg  Labs: Recent Labs    04/06/2022 0654 04/25/2022 0723 04/18/2022 0758 04/06/2022 1101 04/22/2022 1709 04/25/2022 2252  HGB 12.3  --   --   --   --   --   HCT 42.3  --   --   --   --   --   PLT 195  --   --   --   --   --   APTT  --   --   --   --  35  --   LABPROT  --   --   --   --  16.2*  --   INR  --   --   --   --  1.3*  --   HEPARINUNFRC  --   --   --   --   --  0.54  CREATININE  --  1.08*  --   --   --   --   TROPONINIHS 67*  --  863* 4,052*  --   --      Estimated Creatinine Clearance: 82 mL/min (A) (by C-G formula based on SCr of 1.08 mg/dL (H)).   Medical History: Past Medical History:  Diagnosis Date   CHF (congestive heart failure) (HCC)    Endometrial cancer (HCC)    Grade 1   Hypertension    Lymphedema    Morbid obesity ([place])    Osteoarthritis of both knees     Medications:  No anticoagulation prior to admission per my chart review  Assessment: Patient is a 66 y/o F with medical history as above who was BIBEMS from home with respiratory distress. Recently removed from hospice care. Troponin elevated and trending up. Pharmacy consulted to initiate and manage heparin infusion for suspected ACS.  Baseline CBC within normal limits. Baseline aPTT and PT-INR are pending.  Goal of Therapy:  Heparin level 0.3-0.7 units/ml Monitor platelets by anticoagulation protocol: Yes  5/31 2252 HL 0.54, therapeutic x 1   Plan:  --Continue heparin infusion at 1050 units/hr --Recheck HL 6 hours to confirm --Daily CBC per protocol while on IV heparin  Renda Rolls, PharmD, [facility] May 24, 2022 12:35 AM

## 2022-05-27 NOTE — Assessment & Plan Note (Signed)
Following admission, patient was checked for COVID and found to be positive.  Isolation precautions were established.

## 2022-05-27 DEATH — deceased
